# Patient Record
Sex: Male | Born: 1940 | Race: White | Hispanic: No | Marital: Married | Smoking: Never smoker
Health system: Southern US, Community
[De-identification: ages and names within clinical notes are randomized; demographics above are authoritative.]

## PROBLEM LIST (undated history)

## (undated) DIAGNOSIS — Z9889 Other specified postprocedural states: Secondary | ICD-10-CM

## (undated) DIAGNOSIS — N189 Chronic kidney disease, unspecified: Secondary | ICD-10-CM

## (undated) DIAGNOSIS — E785 Hyperlipidemia, unspecified: Secondary | ICD-10-CM

## (undated) DIAGNOSIS — I639 Cerebral infarction, unspecified: Secondary | ICD-10-CM

## (undated) DIAGNOSIS — I729 Aneurysm of unspecified site: Secondary | ICD-10-CM

## (undated) DIAGNOSIS — I2510 Atherosclerotic heart disease of native coronary artery without angina pectoris: Secondary | ICD-10-CM

## (undated) DIAGNOSIS — M2550 Pain in unspecified joint: Secondary | ICD-10-CM

## (undated) DIAGNOSIS — I82409 Acute embolism and thrombosis of unspecified deep veins of unspecified lower extremity: Secondary | ICD-10-CM

## (undated) DIAGNOSIS — R112 Nausea with vomiting, unspecified: Secondary | ICD-10-CM

## (undated) DIAGNOSIS — I10 Essential (primary) hypertension: Secondary | ICD-10-CM

## (undated) HISTORY — DX: Acute embolism and thrombosis of unspecified deep veins of unspecified lower extremity: I82.409

## (undated) HISTORY — PX: CHOLECYSTECTOMY: SHX55

## (undated) HISTORY — PX: CATARACT EXTRACTION: SUR2

## (undated) HISTORY — PX: TOTAL HIP ARTHROPLASTY: SHX124

## (undated) HISTORY — PX: COLONOSCOPY: SHX174

## (undated) HISTORY — PX: KNEE ARTHROSCOPY: SUR90

---

## 2004-07-22 ENCOUNTER — Ambulatory Visit: Payer: Self-pay | Admitting: Gastroenterology

## 2005-03-20 DIAGNOSIS — I729 Aneurysm of unspecified site: Secondary | ICD-10-CM

## 2005-03-20 HISTORY — DX: Aneurysm of unspecified site: I72.9

## 2006-01-18 HISTORY — PX: CARDIAC CATHETERIZATION: SHX172

## 2006-01-22 ENCOUNTER — Other Ambulatory Visit: Payer: Self-pay

## 2006-01-22 ENCOUNTER — Ambulatory Visit: Payer: Self-pay | Admitting: Internal Medicine

## 2006-01-30 ENCOUNTER — Ambulatory Visit: Payer: Self-pay | Admitting: *Deleted

## 2006-01-30 ENCOUNTER — Inpatient Hospital Stay: Payer: Self-pay | Admitting: *Deleted

## 2006-03-07 ENCOUNTER — Encounter: Payer: Self-pay | Admitting: Internal Medicine

## 2006-03-20 ENCOUNTER — Encounter: Payer: Self-pay | Admitting: Internal Medicine

## 2006-04-20 ENCOUNTER — Encounter: Payer: Self-pay | Admitting: Internal Medicine

## 2006-05-19 ENCOUNTER — Encounter: Payer: Self-pay | Admitting: Internal Medicine

## 2006-06-04 ENCOUNTER — Encounter: Payer: Self-pay | Admitting: Internal Medicine

## 2006-06-19 ENCOUNTER — Encounter: Payer: Self-pay | Admitting: Internal Medicine

## 2006-07-19 ENCOUNTER — Encounter: Payer: Self-pay | Admitting: Internal Medicine

## 2006-08-19 ENCOUNTER — Encounter: Payer: Self-pay | Admitting: Internal Medicine

## 2006-09-18 ENCOUNTER — Encounter: Payer: Self-pay | Admitting: Internal Medicine

## 2006-10-19 ENCOUNTER — Encounter: Payer: Self-pay | Admitting: Internal Medicine

## 2006-11-19 ENCOUNTER — Encounter: Payer: Self-pay | Admitting: Internal Medicine

## 2006-11-28 ENCOUNTER — Ambulatory Visit: Payer: Self-pay | Admitting: Nephrology

## 2007-05-19 ENCOUNTER — Emergency Department: Payer: Self-pay | Admitting: Emergency Medicine

## 2007-05-19 ENCOUNTER — Other Ambulatory Visit: Payer: Self-pay

## 2007-05-20 ENCOUNTER — Other Ambulatory Visit: Payer: Self-pay

## 2009-05-10 ENCOUNTER — Ambulatory Visit: Payer: Self-pay | Admitting: Internal Medicine

## 2009-08-20 ENCOUNTER — Ambulatory Visit: Payer: Self-pay | Admitting: Sports Medicine

## 2009-08-24 ENCOUNTER — Emergency Department (HOSPITAL_COMMUNITY)
Admission: EM | Admit: 2009-08-24 | Discharge: 2009-08-24 | Payer: Self-pay | Source: Home / Self Care | Admitting: Emergency Medicine

## 2010-03-20 HISTORY — PX: ELBOW SURGERY: SHX618

## 2010-05-23 ENCOUNTER — Encounter: Payer: Self-pay | Admitting: Sports Medicine

## 2010-06-06 LAB — BASIC METABOLIC PANEL
CO2: 22 mEq/L (ref 19–32)
Calcium: 9.2 mg/dL (ref 8.4–10.5)
Chloride: 108 mEq/L (ref 96–112)
GFR calc non Af Amer: 23 mL/min — ABNORMAL LOW (ref >60)
Glucose, Bld: 212 mg/dL — ABNORMAL HIGH (ref 70–99)
Potassium: 4.1 mEq/L (ref 3.5–5.1)
Sodium: 140 mEq/L (ref 135–145)

## 2010-06-06 LAB — POCT CARDIAC MARKERS
CKMB, poc: 4.5 ng/mL (ref 1.0–8.0)
CKMB, poc: 6.5 ng/mL (ref 1.0–8.0)
Troponin i, poc: <0.05 ng/mL (ref 0.00–0.09)

## 2010-06-06 LAB — DIFFERENTIAL
Eosinophils Absolute: 0 10*3/uL (ref 0.0–0.7)
Eosinophils Relative: 0 % (ref 0–5)
Lymphs Abs: 0.9 10*3/uL (ref 0.7–4.0)
Monocytes Absolute: 0.5 10*3/uL (ref 0.1–1.0)

## 2010-06-06 LAB — HEMOCCULT GUIAC POC 1CARD (OFFICE): Fecal Occult Bld: NEGATIVE

## 2010-06-06 LAB — CBC
HCT: 42 % (ref 39.0–52.0)
MCV: 96.3 fL (ref 78.0–100.0)
RBC: 4.36 MIL/uL (ref 4.22–5.81)

## 2010-06-06 LAB — GLUCOSE, CAPILLARY: Glucose-Capillary: 228 mg/dL — ABNORMAL HIGH (ref 70–99)

## 2010-06-15 ENCOUNTER — Ambulatory Visit: Payer: Self-pay | Admitting: Sports Medicine

## 2010-06-19 ENCOUNTER — Encounter: Payer: Self-pay | Admitting: Sports Medicine

## 2010-07-11 ENCOUNTER — Ambulatory Visit: Payer: Self-pay | Admitting: Sports Medicine

## 2010-07-20 ENCOUNTER — Other Ambulatory Visit: Payer: Self-pay

## 2010-07-20 ENCOUNTER — Inpatient Hospital Stay: Payer: Self-pay | Admitting: Internal Medicine

## 2010-09-08 ENCOUNTER — Encounter (HOSPITAL_COMMUNITY)
Admission: RE | Admit: 2010-09-08 | Discharge: 2010-09-08 | Disposition: A | Discharge status: Home / Self Care | Payer: Medicare Other | Source: Ambulatory Visit | Attending: Neurosurgery | Admitting: Neurosurgery

## 2010-09-08 ENCOUNTER — Ambulatory Visit (HOSPITAL_COMMUNITY)
Admission: RE | Admit: 2010-09-08 | Discharge: 2010-09-08 | Disposition: A | Discharge status: Home / Self Care | Payer: Medicare Other | Source: Ambulatory Visit | Attending: Neurosurgery | Admitting: Neurosurgery

## 2010-09-08 ENCOUNTER — Other Ambulatory Visit (HOSPITAL_COMMUNITY): Payer: Self-pay | Admitting: Neurosurgery

## 2010-09-08 DIAGNOSIS — M48061 Spinal stenosis, lumbar region without neurogenic claudication: Secondary | ICD-10-CM | POA: Insufficient documentation

## 2010-09-08 DIAGNOSIS — Z01818 Encounter for other preprocedural examination: Secondary | ICD-10-CM | POA: Insufficient documentation

## 2010-09-08 DIAGNOSIS — Z01812 Encounter for preprocedural laboratory examination: Secondary | ICD-10-CM | POA: Insufficient documentation

## 2010-09-08 LAB — COMPREHENSIVE METABOLIC PANEL
AST: 17 U/L (ref 0–37)
Alkaline Phosphatase: 62 U/L (ref 39–117)
Chloride: 109 mEq/L (ref 96–112)
Creatinine, Ser: 1.41 mg/dL — ABNORMAL HIGH (ref 0.50–1.35)
GFR calc Af Amer: >60 mL/min (ref >60)
Glucose, Bld: 101 mg/dL — ABNORMAL HIGH (ref 70–99)
Sodium: 146 mEq/L — ABNORMAL HIGH (ref 135–145)
Total Protein: 6.1 g/dL (ref 6.0–8.3)

## 2010-09-08 LAB — CBC
Hemoglobin: 11.9 g/dL — ABNORMAL LOW (ref 13.0–17.0)
MCHC: 33.9 g/dL (ref 30.0–36.0)
MCV: 93.4 fL (ref 78.0–100.0)
RBC: 3.76 MIL/uL — ABNORMAL LOW (ref 4.22–5.81)

## 2010-09-08 LAB — SURGICAL PCR SCREEN
MRSA, PCR: NEGATIVE
Staphylococcus aureus: NEGATIVE

## 2010-09-13 ENCOUNTER — Ambulatory Visit (HOSPITAL_COMMUNITY)
Admission: RE | Admit: 2010-09-13 | Discharge: 2010-09-14 | Disposition: A | Discharge status: Home / Self Care | Payer: Medicare Other | Source: Ambulatory Visit | Attending: Neurosurgery | Admitting: Neurosurgery

## 2010-09-13 ENCOUNTER — Inpatient Hospital Stay (HOSPITAL_COMMUNITY): Payer: Medicare Other

## 2010-09-13 DIAGNOSIS — M47817 Spondylosis without myelopathy or radiculopathy, lumbosacral region: Principal | ICD-10-CM | POA: Insufficient documentation

## 2010-09-13 DIAGNOSIS — M4310 Spondylolisthesis, site unspecified: Secondary | ICD-10-CM | POA: Insufficient documentation

## 2010-09-13 DIAGNOSIS — I2510 Atherosclerotic heart disease of native coronary artery without angina pectoris: Secondary | ICD-10-CM | POA: Insufficient documentation

## 2010-09-13 DIAGNOSIS — Z9861 Coronary angioplasty status: Secondary | ICD-10-CM | POA: Insufficient documentation

## 2010-09-13 DIAGNOSIS — E119 Type 2 diabetes mellitus without complications: Secondary | ICD-10-CM | POA: Insufficient documentation

## 2010-09-13 DIAGNOSIS — Z01818 Encounter for other preprocedural examination: Secondary | ICD-10-CM | POA: Insufficient documentation

## 2010-09-13 DIAGNOSIS — Z01812 Encounter for preprocedural laboratory examination: Secondary | ICD-10-CM | POA: Insufficient documentation

## 2010-09-13 DIAGNOSIS — I10 Essential (primary) hypertension: Secondary | ICD-10-CM | POA: Insufficient documentation

## 2010-09-13 LAB — GLUCOSE, CAPILLARY
Glucose-Capillary: 107 mg/dL — ABNORMAL HIGH (ref 70–99)
Glucose-Capillary: 161 mg/dL — ABNORMAL HIGH (ref 70–99)
Glucose-Capillary: 88 mg/dL (ref 70–99)
Glucose-Capillary: 81 mg/dL (ref 70–99)

## 2010-09-15 HISTORY — PX: BACK SURGERY: SHX140

## 2010-09-20 NOTE — Op Note (Signed)
  NAMEMarland Kitchen  DOVID, BARTKO NO.:  [id number]  MEDICAL RECORD NO.:  [id number]  LOCATION:  3536                         FACILITY:  [facility]  PHYSICIAN:  Danae Orleans. Venetia Maxon, M.D.  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  PROCEDURES:  L4 and L5 decompressive lumbar laminectomy for spinal stenosis with decompression of the L4, L5, and S1 nerve roots.  SURGEON:  Danae Orleans. Venetia Maxon, MD  ASSISTANT:  Georgiann Cocker, RN and Cristi Loron, MD  ANESTHESIA:  General endotracheal anesthesia.  ESTIMATED BLOOD LOSS:  Minimal.  COMPLICATIONS:  None.  DISPOSITION:  To recovery.  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  He has mild minimal spondylolisthesis at L4 and L5, which does not increase with flexion extension, and it was elected to take him to surgery for decompressive lumbar laminectomy for spinal stenosis.  PROCEDURE IN DETAIL:  Todd [name] was brought to the operating room. Following satisfactory and uncomplicated induction of general endotracheal anesthesia, the patient was placed in prone position on Wilson frame.  His low back was prepped and draped in usual sterile fashion.  The area of planned incision was infiltrated with local lidocaine.  Incision was made in the midline, carried to the lumbodorsal fascia was incised bilaterally exposing the L4 and L5 spinous processes. Subperiosteal dissection was performed exposing the L4-5 and L5-S1 interlaminar spaces.  Intraoperative x-ray confirmed correct orientation with marker probes at these  levels.  A total laminectomy of L5 was performed.  The inferior two-thirds of L4 was removed.  The high-speed drill was used to thin the laminae at L4 and L5 and the spinal canal was decompressed under loupe magnification with a variety of Kerrison rongeurs and angled curettes to decompress the L4, L5, and S1 nerve roots bilaterally.  The neural elements were felt to be well decompressed bilaterally.  Hemostasis assured with Gelfoam soaked in thrombin.  The wound was irrigated with copious bacitracin and saline. The self-retaining retractor was removed.  The lumbodorsal fascia was closed with 0 Vicryl sutures, subcutaneous tissues were approximated with 2-0 Vicryl interrupted inverted sutures, and skin edges were approximated with 3-0 Vicryl subcuticular stitch.  Wound was dressed with Dermabond.  The patient was x-rayed in the operating room, taken to recovery in stable and satisfactory condition having tolerated the operation well.  Counts were correct at the end of the case.     Danae Orleans. Venetia Maxon, M.D.     JDS/MEDQ  D:  09/13/2010  T:  09/13/2010  Job:  [id number]  Electronically Signed by Maeola Harman M.D. on 09/20/2010 08:49:55 AM

## 2010-12-21 ENCOUNTER — Ambulatory Visit: Payer: Self-pay | Admitting: Ophthalmology

## 2010-12-30 ENCOUNTER — Encounter (HOSPITAL_COMMUNITY)
Admission: RE | Admit: 2010-12-30 | Discharge: 2010-12-30 | Disposition: A | Discharge status: Home / Self Care | Payer: Medicare Other | Source: Ambulatory Visit | Attending: Neurosurgery | Admitting: Neurosurgery

## 2010-12-30 LAB — SURGICAL PCR SCREEN
MRSA, PCR: NEGATIVE
Staphylococcus aureus: NEGATIVE

## 2010-12-30 LAB — CBC
MCH: 31.2 pg (ref 26.0–34.0)
MCHC: 35.3 g/dL (ref 30.0–36.0)

## 2010-12-30 LAB — BASIC METABOLIC PANEL
BUN: 32 mg/dL — ABNORMAL HIGH (ref 6–23)
CO2: 23 mEq/L (ref 19–32)
Calcium: 9 mg/dL (ref 8.4–10.5)

## 2011-01-10 ENCOUNTER — Observation Stay (HOSPITAL_COMMUNITY)
Admission: RE | Admit: 2011-01-10 | Discharge: 2011-01-10 | Disposition: A | Discharge status: Home / Self Care | Payer: Medicare Other | Source: Ambulatory Visit | Attending: Neurosurgery | Admitting: Neurosurgery

## 2011-01-10 DIAGNOSIS — E669 Obesity, unspecified: Secondary | ICD-10-CM | POA: Insufficient documentation

## 2011-01-10 DIAGNOSIS — Z01812 Encounter for preprocedural laboratory examination: Secondary | ICD-10-CM | POA: Insufficient documentation

## 2011-01-10 DIAGNOSIS — I10 Essential (primary) hypertension: Secondary | ICD-10-CM | POA: Insufficient documentation

## 2011-01-10 DIAGNOSIS — G5620 Lesion of ulnar nerve, unspecified upper limb: Principal | ICD-10-CM | POA: Insufficient documentation

## 2011-01-10 DIAGNOSIS — Z794 Long term (current) use of insulin: Secondary | ICD-10-CM | POA: Insufficient documentation

## 2011-01-10 DIAGNOSIS — E119 Type 2 diabetes mellitus without complications: Secondary | ICD-10-CM | POA: Insufficient documentation

## 2011-01-10 LAB — GLUCOSE, CAPILLARY
Glucose-Capillary: 132 mg/dL — ABNORMAL HIGH (ref 70–99)
Glucose-Capillary: 137 mg/dL — ABNORMAL HIGH (ref 70–99)
Glucose-Capillary: 163 mg/dL — ABNORMAL HIGH (ref 70–99)

## 2011-01-17 NOTE — Op Note (Signed)
  NAMEMarland Kitchen  KIMBLE, [name] NO.:  [id number]  MEDICAL RECORD NO.:  [id number]  LOCATION:  3528                         FACILITY:  [facility]  PHYSICIAN:  Danae Orleans. Venetia Maxon, M.D.  DATE OF BIRTH:  1940/12/01  DATE OF PROCEDURE:  01/10/2011 DATE OF DISCHARGE:                              OPERATIVE REPORT   PREOPERATIVE DIAGNOSIS:  Right ulnar neuropathy.  POSTOPERATIVE DIAGNOSIS:  Right ulnar neuropathy.  PROCEDURE:  Right ulnar nerve decompression.  SURGEONS:  Danae Orleans. Venetia Maxon, MD  ANESTHESIA:  General LMA with local anesthetic.  ESTIMATED BLOOD LOSS:  Minimal.  COMPLICATIONS:  None.  DISPOSITION:  Recovery.  INDICATIONS:  Todd [name] is a 70 year old man with a profound right ulnar neuropathy with atrophy in his hands and block.  EMG and nerve conduction velocity established block at the elbow.  It was elected to perform a right ulnar nerve decompression.  PROCEDURE:  [name] was brought to the operating room.  Following satisfactory and uncomplicated induction of general anesthesia with LMA, he was placed in a supine position.  His right arm and medial elbow were then shaved, then his arm was prepped and draped in usual sterile fashion using Betadine scrub and paint and sterile stockinette.  Area of planned incision was infiltrated with local lidocaine.  A small curvilinear incision approximately 5 cm in length was made just above the medial epicondyle carried sharply through the fascia layers to expose the ulnar nerve.  This was then very carefully decompressed under loupe magnification.  There were some areas of constriction particularly just below the elbow joint and adjust at the nerve entered into the flexor carpi ulnaris where there was pain and band of constrictive fascia which was compressing the nerve.  This space was opened, additionally at the level of the elbow there appeared to be some constrictive tissue and also more proximal to the elbow.   There was a fascial area of constriction by fascia and this was opened. Subsequently, it was felt that the nerve was well decompressed both proximally and distally without any evidence of continued compression. Additionally the elbow was flexed and there did not appear to be any subluxation of the nerve.  The wound was then irrigated.  Hemostasis assured and the subcutaneous tissues were reapproximated with 2-0 Vicryl sutures.  Skin edges were approximated with 3-0 Vicryl subcuticular stitch.  Wound was dressed with benzoin, Steri-Strips, Telfa gauze, fluffs gauze, fluffs Kerlix, Kerlix and Kling wrap.  The patient was then extubated in the operating room, taken to recovery in stable satisfactory condition, having tolerated the operation well.  Counts were correct at the end of the case.     Danae Orleans. Venetia Maxon, M.D.     JDS/MEDQ  D:  01/10/2011  T:  01/10/2011  Job:  [id number]  Electronically Signed by Maeola Harman M.D. on 01/17/2011 07:49:24 AM

## 2011-01-30 ENCOUNTER — Other Ambulatory Visit: Payer: Self-pay | Admitting: Neurosurgery

## 2011-02-01 ENCOUNTER — Ambulatory Visit: Payer: Self-pay | Admitting: Ophthalmology

## 2011-02-21 ENCOUNTER — Encounter (HOSPITAL_COMMUNITY): Payer: Self-pay | Admitting: Pharmacy Technician

## 2011-02-24 ENCOUNTER — Encounter (HOSPITAL_COMMUNITY): Payer: Self-pay

## 2011-02-24 ENCOUNTER — Encounter (HOSPITAL_COMMUNITY)
Admission: RE | Admit: 2011-02-24 | Discharge: 2011-02-24 | Disposition: A | Discharge status: Home / Self Care | Payer: Medicare Other | Source: Ambulatory Visit | Attending: Neurosurgery | Admitting: Neurosurgery

## 2011-02-24 HISTORY — DX: Pain in unspecified joint: M25.50

## 2011-02-24 HISTORY — DX: Essential (primary) hypertension: I10

## 2011-02-24 HISTORY — DX: Nausea with vomiting, unspecified: R11.2

## 2011-02-24 HISTORY — DX: Aneurysm of unspecified site: I72.9

## 2011-02-24 HISTORY — DX: Other specified postprocedural states: Z98.890

## 2011-02-24 HISTORY — DX: Hyperlipidemia, unspecified: E78.5

## 2011-02-24 HISTORY — DX: Atherosclerotic heart disease of native coronary artery without angina pectoris: I25.10

## 2011-02-24 LAB — BASIC METABOLIC PANEL
BUN: 35 mg/dL — ABNORMAL HIGH (ref 6–23)
Creatinine, Ser: 1.66 mg/dL — ABNORMAL HIGH (ref 0.50–1.35)
GFR calc Af Amer: 47 mL/min — ABNORMAL LOW (ref >90)
GFR calc non Af Amer: 40 mL/min — ABNORMAL LOW (ref >90)
Glucose, Bld: 119 mg/dL — ABNORMAL HIGH (ref 70–99)
Potassium: 4.2 mEq/L (ref 3.5–5.1)

## 2011-02-24 LAB — CBC
Hemoglobin: 13.3 g/dL (ref 13.0–17.0)
MCH: 31 pg (ref 26.0–34.0)
MCHC: 34.6 g/dL (ref 30.0–36.0)
RDW: 13.1 % (ref 11.5–15.5)

## 2011-02-24 NOTE — Pre-Procedure Instructions (Signed)
20 Todd [name]  02/24/2011   Your procedure is scheduled on:  Thurs,Dec 13 @ 0745  Report to [facility] at 0530 AM.  Call this number if you have problems the morning of surgery: [phone]   Remember:   Do not eat food:After Midnight.  May have clear liquids: up to 4 Hours before arrival.(until 1:30am)  Clear liquids include soda, tea, black coffee, apple or grape juice, broth.  Take these medicines the morning of surgery with A SIP OF WATER: Amlodipine,Pain Pill(if needed),Metoprolol   Do not wear jewelry, make-up or nail polish.  Do not wear lotions, powders, or perfumes. You may wear deodorant.  Do not shave 48 hours prior to surgery.  Do not bring valuables to the hospital.  Contacts, dentures or bridgework may not be worn into surgery.  Leave suitcase in the car. After surgery it may be brought to your room.  For patients admitted to the hospital, checkout time is 11:00 AM the day of discharge.   Patients discharged the day of surgery will not be allowed to drive home.  Name and phone number of your driver:   Special Instructions: CHG Shower Use Special Wash: 1/2 bottle night before surgery and 1/2 bottle morning of surgery.   Please read over the following fact sheets that you were given: Pain Booklet, Coughing and Deep Breathing, MRSA Information and Surgical Site Infection Prevention

## 2011-02-24 NOTE — Progress Notes (Signed)
[facility]-is cardiologist;[place]--has an appointment 03/01/11  Stress test /echo/ekg in May 2012 requested from [facility]

## 2011-03-01 MED ORDER — CEFAZOLIN SODIUM-DEXTROSE 2-3 GM-% IV SOLR
2.0000 g | INTRAVENOUS | Status: AC
  Administered 2011-03-02: 2 g via INTRAVENOUS
  Filled 2011-03-01: qty 50

## 2011-03-02 ENCOUNTER — Other Ambulatory Visit: Payer: Self-pay

## 2011-03-02 ENCOUNTER — Encounter (HOSPITAL_COMMUNITY): Payer: Self-pay | Admitting: *Deleted

## 2011-03-02 ENCOUNTER — Encounter (HOSPITAL_COMMUNITY): Payer: Self-pay | Admitting: Anesthesiology

## 2011-03-02 ENCOUNTER — Encounter (HOSPITAL_COMMUNITY)
Admission: RE | Disposition: A | Discharge status: Home / Self Care | Payer: Self-pay | Source: Ambulatory Visit | Attending: Neurosurgery

## 2011-03-02 ENCOUNTER — Ambulatory Visit (HOSPITAL_COMMUNITY): Payer: Medicare Other | Admitting: Anesthesiology

## 2011-03-02 ENCOUNTER — Ambulatory Visit (HOSPITAL_COMMUNITY)
Admission: RE | Admit: 2011-03-02 | Discharge: 2011-03-02 | Disposition: A | Discharge status: Home / Self Care | Payer: Medicare Other | Source: Ambulatory Visit | Attending: Neurosurgery | Admitting: Neurosurgery

## 2011-03-02 DIAGNOSIS — G5620 Lesion of ulnar nerve, unspecified upper limb: Secondary | ICD-10-CM | POA: Insufficient documentation

## 2011-03-02 DIAGNOSIS — Z794 Long term (current) use of insulin: Secondary | ICD-10-CM | POA: Insufficient documentation

## 2011-03-02 DIAGNOSIS — I10 Essential (primary) hypertension: Secondary | ICD-10-CM | POA: Insufficient documentation

## 2011-03-02 DIAGNOSIS — E109 Type 1 diabetes mellitus without complications: Secondary | ICD-10-CM | POA: Insufficient documentation

## 2011-03-02 DIAGNOSIS — C001 Malignant neoplasm of external lower lip: Secondary | ICD-10-CM | POA: Insufficient documentation

## 2011-03-02 HISTORY — PX: ULNAR NERVE TRANSPOSITION: SHX2595

## 2011-03-02 LAB — GLUCOSE, CAPILLARY: Glucose-Capillary: 155 mg/dL — ABNORMAL HIGH (ref 70–99)

## 2011-03-02 SURGERY — ULNAR NERVE DECOMPRESSION/TRANSPOSITION
Anesthesia: General | Laterality: Left | Wound class: Clean

## 2011-03-02 MED ORDER — ROSUVASTATIN CALCIUM 10 MG PO TABS
10.0000 mg | ORAL_TABLET | Freq: Every day | ORAL | Status: DC
  Filled 2011-03-02: qty 1

## 2011-03-02 MED ORDER — INSULIN ASPART 100 UNIT/ML ~~LOC~~ SOLN: 9.0000 [IU] | Freq: Every day | SUBCUTANEOUS | Status: DC

## 2011-03-02 MED ORDER — AMLODIPINE BESYLATE 5 MG PO TABS
5.0000 mg | ORAL_TABLET | Freq: Every day | ORAL | Status: DC
  Filled 2011-03-02 (×2): qty 1

## 2011-03-02 MED ORDER — ONDANSETRON HCL 4 MG/2ML IJ SOLN: 4.0000 mg | INTRAMUSCULAR | Status: DC | PRN

## 2011-03-02 MED ORDER — LACTATED RINGERS IV SOLN
INTRAVENOUS | Status: DC | PRN
  Administered 2011-03-02 (×2): via INTRAVENOUS

## 2011-03-02 MED ORDER — HYDROCODONE-ACETAMINOPHEN 5-325 MG PO TABS: 1.0000 | ORAL_TABLET | Freq: Every day | ORAL | Status: DC

## 2011-03-02 MED ORDER — ONDANSETRON HCL 4 MG/2ML IJ SOLN
INTRAMUSCULAR | Status: DC | PRN
  Administered 2011-03-02: 4 mg via INTRAVENOUS

## 2011-03-02 MED ORDER — INSULIN GLARGINE 100 UNIT/ML ~~LOC~~ SOLN
32.0000 [IU] | Freq: Every day | SUBCUTANEOUS | Status: DC
  Filled 2011-03-02: qty 3

## 2011-03-02 MED ORDER — SODIUM CHLORIDE 0.9 % IJ SOLN: 3.0000 mL | INTRAMUSCULAR | Status: DC | PRN

## 2011-03-02 MED ORDER — KETOROLAC TROMETHAMINE 30 MG/ML IJ SOLN
30.0000 mg | Freq: Four times a day (QID) | INTRAMUSCULAR | Status: DC
  Administered 2011-03-02: 30 mg via INTRAVENOUS
  Filled 2011-03-02: qty 1

## 2011-03-02 MED ORDER — PROPOFOL 10 MG/ML IV BOLUS
INTRAVENOUS | Status: DC | PRN
  Administered 2011-03-02: 130 mg via INTRAVENOUS

## 2011-03-02 MED ORDER — INSULIN ASPART 100 UNIT/ML ~~LOC~~ SOLN
8.0000 [IU] | SUBCUTANEOUS | Status: DC
  Administered 2011-03-02: 8 [IU] via SUBCUTANEOUS

## 2011-03-02 MED ORDER — PHENOL 1.4 % MT LIQD: 1.0000 | OROMUCOSAL | Status: DC | PRN

## 2011-03-02 MED ORDER — MORPHINE SULFATE 4 MG/ML IJ SOLN: 1.0000 mg | INTRAMUSCULAR | Status: DC | PRN

## 2011-03-02 MED ORDER — BACITRACIN 50000 UNITS IM SOLR
INTRAMUSCULAR | Status: AC
  Filled 2011-03-02: qty 50000

## 2011-03-02 MED ORDER — FENTANYL CITRATE 0.05 MG/ML IJ SOLN
INTRAMUSCULAR | Status: DC | PRN
  Administered 2011-03-02: 100 ug via INTRAVENOUS

## 2011-03-02 MED ORDER — PHENYLEPHRINE HCL 10 MG/ML IJ SOLN
INTRAMUSCULAR | Status: DC | PRN
  Administered 2011-03-02 (×3): 80 ug via INTRAVENOUS

## 2011-03-02 MED ORDER — HYDROMORPHONE HCL PF 1 MG/ML IJ SOLN: 0.2500 mg | INTRAMUSCULAR | Status: DC | PRN

## 2011-03-02 MED ORDER — ACETAMINOPHEN 650 MG RE SUPP: 650.0000 mg | RECTAL | Status: DC | PRN

## 2011-03-02 MED ORDER — INSULIN ASPART 100 UNIT/ML ~~LOC~~ SOLN: 0.0000 [IU] | Freq: Every day | SUBCUTANEOUS | Status: DC

## 2011-03-02 MED ORDER — ASPIRIN EC 81 MG PO TBEC
81.0000 mg | DELAYED_RELEASE_TABLET | Freq: Every day | ORAL | Status: DC
  Administered 2011-03-02: 81 mg via ORAL
  Filled 2011-03-02: qty 1

## 2011-03-02 MED ORDER — GLYCOPYRROLATE 0.2 MG/ML IJ SOLN
INTRAMUSCULAR | Status: DC | PRN
  Administered 2011-03-02 (×2): .2 mg via INTRAVENOUS

## 2011-03-02 MED ORDER — INSULIN ASPART 100 UNIT/ML ~~LOC~~ SOLN: 7.0000 [IU] | Freq: Three times a day (TID) | SUBCUTANEOUS | Status: DC

## 2011-03-02 MED ORDER — MEPERIDINE HCL 25 MG/ML IJ SOLN: 6.2500 mg | INTRAMUSCULAR | Status: DC | PRN

## 2011-03-02 MED ORDER — CEFAZOLIN SODIUM 1-5 GM-% IV SOLN
1.0000 g | Freq: Three times a day (TID) | INTRAVENOUS | Status: DC
  Administered 2011-03-02: 1 g via INTRAVENOUS
  Filled 2011-03-02 (×2): qty 50

## 2011-03-02 MED ORDER — LIDOCAINE HCL (CARDIAC) 20 MG/ML IV SOLN
INTRAVENOUS | Status: DC | PRN
  Administered 2011-03-02: 40 mg via INTRAVENOUS

## 2011-03-02 MED ORDER — INSULIN ASPART 100 UNIT/ML ~~LOC~~ SOLN
0.0000 [IU] | Freq: Three times a day (TID) | SUBCUTANEOUS | Status: DC
  Administered 2011-03-02: 3 [IU] via SUBCUTANEOUS
  Filled 2011-03-02: qty 3

## 2011-03-02 MED ORDER — HYDROCODONE-ACETAMINOPHEN 5-325 MG PO TABS
1.0000 | ORAL_TABLET | ORAL | Status: DC | PRN
  Administered 2011-03-02 (×2): 1 via ORAL
  Filled 2011-03-02 (×2): qty 1

## 2011-03-02 MED ORDER — DOCUSATE SODIUM 100 MG PO CAPS: 100.0000 mg | ORAL_CAPSULE | Freq: Two times a day (BID) | ORAL | Status: DC

## 2011-03-02 MED ORDER — ONDANSETRON HCL 4 MG/2ML IJ SOLN: 4.0000 mg | Freq: Once | INTRAMUSCULAR | Status: DC | PRN

## 2011-03-02 MED ORDER — MENTHOL 3 MG MT LOZG: 1.0000 | LOZENGE | OROMUCOSAL | Status: DC | PRN

## 2011-03-02 MED ORDER — ZOLPIDEM TARTRATE 5 MG PO TABS: 5.0000 mg | ORAL_TABLET | Freq: Every evening | ORAL | Status: DC | PRN

## 2011-03-02 MED ORDER — SODIUM CHLORIDE 0.9 % IV SOLN
INTRAVENOUS | Status: AC
  Filled 2011-03-02: qty 500

## 2011-03-02 MED ORDER — SODIUM CHLORIDE 0.9 % IJ SOLN
3.0000 mL | Freq: Two times a day (BID) | INTRAMUSCULAR | Status: DC
  Administered 2011-03-02: 3 mL via INTRAVENOUS

## 2011-03-02 MED ORDER — INSULIN ASPART 100 UNIT/ML ~~LOC~~ SOLN: 7.0000 [IU] | Freq: Every day | SUBCUTANEOUS | Status: DC

## 2011-03-02 MED ORDER — OXYCODONE-ACETAMINOPHEN 5-325 MG PO TABS: 1.0000 | ORAL_TABLET | ORAL | Status: DC | PRN

## 2011-03-02 MED ORDER — LIDOCAINE HCL (PF) 1 % IJ SOLN
INTRAMUSCULAR | Status: DC | PRN
  Administered 2011-03-02: 10 mL

## 2011-03-02 MED ORDER — SODIUM BICARBONATE 650 MG PO TABS
1300.0000 mg | ORAL_TABLET | Freq: Three times a day (TID) | ORAL | Status: DC
  Administered 2011-03-02: 1300 mg via ORAL
  Filled 2011-03-02 (×3): qty 2

## 2011-03-02 MED ORDER — BACITRACIN ZINC 500 UNIT/GM EX OINT
TOPICAL_OINTMENT | CUTANEOUS | Status: DC | PRN
  Administered 2011-03-02: 1 via TOPICAL

## 2011-03-02 MED ORDER — METOPROLOL TARTRATE 25 MG PO TABS
25.0000 mg | ORAL_TABLET | Freq: Every day | ORAL | Status: DC
  Filled 2011-03-02: qty 1

## 2011-03-02 MED ORDER — KCL IN DEXTROSE-NACL 20-5-0.45 MEQ/L-%-% IV SOLN
INTRAVENOUS | Status: DC
  Filled 2011-03-02 (×2): qty 1000

## 2011-03-02 MED ORDER — VITAMIN D3 25 MCG (1000 UNIT) PO TABS
1000.0000 [IU] | ORAL_TABLET | Freq: Every day | ORAL | Status: DC
  Filled 2011-03-02: qty 1

## 2011-03-02 MED ORDER — ACETAMINOPHEN 325 MG PO TABS: 650.0000 mg | ORAL_TABLET | ORAL | Status: DC | PRN

## 2011-03-02 MED ORDER — INSULIN ASPART 100 UNIT/ML ~~LOC~~ SOLN: 4.0000 [IU] | Freq: Three times a day (TID) | SUBCUTANEOUS | Status: DC

## 2011-03-02 SURGICAL SUPPLY — 52 items
BAG DECANTER FOR FLEXI CONT (MISCELLANEOUS) ×2 IMPLANT
BANDAGE ELASTIC 4 VELCRO ST LF (GAUZE/BANDAGES/DRESSINGS) IMPLANT
BANDAGE GAUZE 4  KLING STR (GAUZE/BANDAGES/DRESSINGS) ×2 IMPLANT
BANDAGE GAUZE ELAST BULKY 4 IN (GAUZE/BANDAGES/DRESSINGS) ×4 IMPLANT
BENZOIN TINCTURE PRP APPL 2/3 (GAUZE/BANDAGES/DRESSINGS) ×2 IMPLANT
BLADE SURG 10 STRL SS (BLADE) ×2 IMPLANT
BLADE SURG 15 STRL LF DISP TIS (BLADE) ×1 IMPLANT
BLADE SURG 15 STRL SS (BLADE) ×1
BRUSH SCRUB EZ PLAIN DRY (MISCELLANEOUS) ×2 IMPLANT
CANISTER SUCTION 2500CC (MISCELLANEOUS) ×2 IMPLANT
CLOTH BEACON ORANGE TIMEOUT ST (SAFETY) ×2 IMPLANT
CORDS BIPOLAR (ELECTRODE) ×2 IMPLANT
DRAPE EXTREMITY T 121X128X90 (DRAPE) ×2 IMPLANT
DRAPE PROXIMA HALF (DRAPES) ×4 IMPLANT
DRESSING TELFA 8X3 (GAUZE/BANDAGES/DRESSINGS) ×2 IMPLANT
ELECT REM PT RETURN 9FT ADLT (ELECTROSURGICAL) ×2
ELECTRODE REM PT RTRN 9FT ADLT (ELECTROSURGICAL) ×1 IMPLANT
GAUZE SPONGE 4X4 16PLY XRAY LF (GAUZE/BANDAGES/DRESSINGS) ×2 IMPLANT
GLOVE BIO SURGEON STRL SZ8 (GLOVE) ×2 IMPLANT
GLOVE BIOGEL PI IND STRL 8.5 (GLOVE) ×2 IMPLANT
GLOVE BIOGEL PI INDICATOR 8.5 (GLOVE) ×2
GLOVE EXAM NITRILE LRG STRL (GLOVE) IMPLANT
GLOVE EXAM NITRILE MD LF STRL (GLOVE) ×2 IMPLANT
GLOVE EXAM NITRILE XL STR (GLOVE) IMPLANT
GLOVE EXAM NITRILE XS STR PU (GLOVE) IMPLANT
GLOVE SURG SS PI 8.0 STRL IVOR (GLOVE) ×4 IMPLANT
GOWN BRE IMP SLV AUR LG STRL (GOWN DISPOSABLE) IMPLANT
GOWN BRE IMP SLV AUR XL STRL (GOWN DISPOSABLE) ×2 IMPLANT
GOWN STRL REIN 2XL LVL4 (GOWN DISPOSABLE) ×2 IMPLANT
KIT BASIN OR (CUSTOM PROCEDURE TRAY) ×2 IMPLANT
KIT ROOM TURNOVER OR (KITS) ×2 IMPLANT
LOCATOR NERVE 3 VOLT (DISPOSABLE) ×2 IMPLANT
LOOP VESSEL MAXI BLUE (MISCELLANEOUS) ×2 IMPLANT
NEEDLE HYPO 25X1 1.5 SAFETY (NEEDLE) ×2 IMPLANT
NS IRRIG 1000ML POUR BTL (IV SOLUTION) ×2 IMPLANT
PACK SURGICAL SETUP 50X90 (CUSTOM PROCEDURE TRAY) ×2 IMPLANT
PAD ARMBOARD 7.5X6 YLW CONV (MISCELLANEOUS) ×6 IMPLANT
PENCIL BUTTON HOLSTER BLD 10FT (ELECTRODE) ×2 IMPLANT
SPONGE GAUZE 4X4 12PLY (GAUZE/BANDAGES/DRESSINGS) ×2 IMPLANT
STOCKINETTE 4X48 STRL (DRAPES) ×2 IMPLANT
STRIP CLOSURE SKIN 1/2X4 (GAUZE/BANDAGES/DRESSINGS) ×2 IMPLANT
SUT ETHILON 3 0 PS 1 (SUTURE) IMPLANT
SUT SILK 2 0 FS (SUTURE) IMPLANT
SUT VIC AB 2-0 CP2 18 (SUTURE) ×2 IMPLANT
SUT VIC AB 3-0 SH 8-18 (SUTURE) ×2 IMPLANT
SYR BULB 3OZ (MISCELLANEOUS) ×2 IMPLANT
SYR CONTROL 10ML LL (SYRINGE) ×2 IMPLANT
TOWEL OR 17X24 6PK STRL BLUE (TOWEL DISPOSABLE) ×2 IMPLANT
TOWEL OR 17X26 10 PK STRL BLUE (TOWEL DISPOSABLE) ×2 IMPLANT
TUBE CONNECTING 12X1/4 (SUCTIONS) ×2 IMPLANT
UNDERPAD 30X30 INCONTINENT (UNDERPADS AND DIAPERS) ×2 IMPLANT
WATER STERILE IRR 1000ML POUR (IV SOLUTION) ×2 IMPLANT

## 2011-03-02 NOTE — Op Note (Signed)
03/02/2011  8:49 AM  PATIENT:  Todd [name]  70 y.o. male  PRE-OPERATIVE DIAGNOSIS:  ulnar neruropathy  POST-OPERATIVE DIAGNOSIS:  ulnar neuropathy  PROCEDURE:  Procedure(s): ULNAR NERVE DECOMPRESSION  SURGEON:  Surgeon(s): Dorian Heckle, MD  PHYSICIAN ASSISTANT:   ASSISTANTS: none   ANESTHESIA:   general  EBL:  Total I/O In: 1100 [I.V.:1100] Out: -   BLOOD ADMINISTERED:none  DRAINS: none   LOCAL MEDICATIONS USED:  LIDOCAINE 10CC  SPECIMEN:  No Specimen  DISPOSITION OF SPECIMEN:  N/A  COUNTS:  YES  TOURNIQUET:  * No tourniquets in log *  DICTATION: Patient previously underwent right ulnar nerve decompression and did well with this. He has left ulnar neuropathy and it was elected to perform a left ulnar nerve decompression.  Procedure: Patient was brought to the operating room. Following the smooth and uncomplicated induction of general anesthesia with LMA, patient's left arm was prepped and draped in the usual sterile fashion with Betadine scrub and paint and sterile stockinette. Left medial epicondyle was identified and a small curvilinear incision was made based on this, after infiltrating the skin and subcutaneous tissues with local lidocaine. Under high-power loupe magnification the ulnar nerve was identified and carefully freed from a ring of encircling scar tissue proximal to the cubital tunnel. The nerve was freed more proximally in the arm and also more distally into the forearm between the 2 heads of the flexor carpi ulnaris. Range of motion testing did not reveal a tendency for the nerve to sublux. Consequently no transposition was performed. The wound was irrigated and closed with 20 and 3-0 Vicryl stitches a sterile occlusive dressing was placed along with a bulky wrap. Patient was taken to recovery in stable and satisfactory condition having tolerated the operation well after correct in the case.   PLAN OF CARE: Admit for overnight observation  PATIENT  DISPOSITION:  PACU - hemodynamically stable.   Delay start of Pharmacological VTE agent (>24hrs) due to surgical blood loss or risk of bleeding:  YES

## 2011-03-02 NOTE — H&P (Signed)
  Date of Initial H&P: 01/31/2011  History reviewed, patient examined, no change in status, stable for surgery.

## 2011-03-02 NOTE — H&P (Signed)
Todd [name]  #[id number] DOB:  July 05, 1940 01/31/2011:  Todd [name] returns today. Todd [name] is doing very well following right ulnar nerve decompression. Todd [name] does have intermittent pain in his right elbow and says the numbness persists in his right fifth digit. Todd [name] is taking Hydrocodone 5/325 nightly.    His left arm continues to be symptomatic. I told him that given the muscular atrophy I did not think that Todd [name] would have a speedy recovery of his right-sided weakness.    We are going to go ahead with left ulnar nerve decompression on 03/02/2011.  Risks and benefits were discussed and Todd [name] wishes to proceed.           Danae Orleans. Venetia Maxon, M.D./aft  cc: Dr. Merri Ray  Dr. Arnoldo Hooker  Dr. Conchita Paris   Todd [name]  #[id number] DOB:  [birth year] 11/07/2010:     Todd [name] comes back today doing well from the standpoint of his low back lumbar decompression surgery.  Todd [name] is still having considerable weakness in both of his hands and Todd [name] has evidence of atrophy and ulnar distribution right greater than left.  His EMG and nerve conduction velocities show bilateral ulnar neuropathy, right greater than left with slowing at the elbow with superimposed diabetic polyneuropathy.   At this point given the severity of his weakness and with significant ulnar neuropathy at the elbow, I would recommend going ahead with right ulnar nerve decompression for ulnar neuropathy.  Todd [name] wants to do so and we will plan on doing this after October 15th.  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  We will plan on going ahead with the surgery after October 15th and we will see how Todd [name] does with that and make further recommendations regarding his left side.           Danae Orleans. Venetia Maxon, M.D./gde  cc: Dr. Merri Ray  Dr. Arnoldo Hooker  Dr. 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  Todd [name] says this began in November of 2011.  Todd [name] has used a walker since Todd [name] fell on 07/19/2010.  Todd [name] has additional medical problems of insulin dependent diabetes, hypertension, and coronary artery disease for which Todd [name] is wearing a monitor per Dr. Arnoldo Hooker at [facility].  Todd [name] has had a right ACL repair in 1985, left eye surgery x three in the past, cholecystectomy in 1988, heart stent 01/2006.  Todd [name] states that Todd [name] needs a left hip replacement.  Todd [name] is also awaiting right cataract surgery.  Todd [name] had an epidural steroid injection on 07/18/2010 which gave him some limited relief.    REVIEW OF SYSTEMS:   A detailed Review of Systems sheet was reviewed with the patient.  Pertinent positives include weight loss, wears glasses, cataracts, high blood pressure, high cholesterol, leg pain while walking, leg weakness, back pain, arm pain, leg pain, fainting spells or "blacking out", and diabetes.  All other systems are negative; this includes Ears, nose, mouth, throat, Respiratory, Gastrointestinal, Genitourinary, Integumentary & Breast, Psychiatric, Hematologic/Lymphatic, Allergic/Immunologic.    PAST MEDICAL HISTORY:      Current Medical Conditions:    Significant for hypertension.    Prior Operations and Hospitalizations:   As previously described above.  Medications and Allergies:  Medications - Amlodipine 5 mg. qd, Aspirin enteric-coated 81 mg. qd, Crestor 10 mg. q.h.s., Lantus 100 units two units q.h.s., NovoLog 7 units qam, 8 units at lunch and 8 units at supper, Sodium Bicarbonate 650 mg. two t.i.d., Toprol XL 25 mg. qd, Vitamin D3 1000 I.U. qd, and Vicodin 5/500 one-half po q4-6h.  Todd [name] has an ALLERGY TO VITAMIN D WHICH CAUSES WEIGHT GAIN.      Height and Weight:     Todd [name] is 5'9 " tall, 194 lbs.    FAMILY HISTORY:    His mother died at age 39 with diabetes.  His father died at age 74 of lung cancer and heart  disease.    SOCIAL HISTORY:    Todd [name] is a nonsmoker, nondrinker, and no history of substance abuse.    DIAGNOSTIC STUDIES:   Plain radiographs of his lumbar spine show that Todd [name] has multifactorial stenosis at L4-5 which is moderate to severe and also multifactorial and bilateral mild to moderate neural foraminal narrowing at L4-5 and to a lesser extent at the L5-S1 level.  Todd [name] has slight spondylolisthesis of L4 on 5 which is 2 mm. on extension and 3 mm. on flexion.    Plain radiographs of the hip were also reviewed which show that Todd [name] has bone on bone on his left hip with some sclerotic changes as well as cystic changes in the femoral head and acetabulum.    PHYSICAL EXAMINATION:      General Appearance:   On examination today, Todd [name] is a pleasant and cooperative man in no acute distress.     Blood Pressure, Pulse:     Blood pressure is 130/76.  Heart rate is 70 and regular.  Respiratory rate is 16.      HEENT - normocephalic, atraumatic.  The pupils are equal, round and reactive to light.  The extraocular muscles are intact.  Sclerae - white.  Conjunctiva - pink.  Oropharynx benign.  Uvula midline.     Neck - there are no masses, meningismus, deformities, tracheal deviation, jugular vein distention or carotid bruits.  There is normal cervical range of motion.  Spurlings' test is negative without reproducible radicular pain turning the patient's head to either side.  Lhermitte's sign is not present with axial compression.      Respiratory - there is normal respiratory effort with good intercostal function.  Lungs are clear to auscultation.  There are no rales, rhonchi or wheezes.      Cardiovascular - the heart has regular rate and rhythm to auscultation.  No murmurs are appreciated.  There is no extremity edema, cyanosis or clubbing.  There are palpable pedal pulses.    NEUROLOGICAL EXAMINATION: The patient is oriented to time, person and place and has good recall of both recent and remote memory  with normal attention span and concentration.    The patient speaks with clear and fluent speech and exhibits normal language function and appropriate fund of knowledge.      Cranial Nerve Examination - pupils are equal, round and reactive to light.  Extraocular movements are full.  Visual fields are full to confrontational testing.  Facial sensation and facial movement are symmetric and intact.  Hearing is intact to finger rub.  Palate is upgoing.  Shoulder shrug is symmetric.  Tongue protrudes in the midline.      Motor Examination - motor strength is 5/5 in the bilateral deltoids, biceps, triceps, handgrips, wrist extensors, interosseous, right hand intrinsics at  4/5.  In the lower extremities motor strength is 5/5 in hip flexion, extension, quadriceps, hamstrings, plantar flexion, dorsiflexion, 5/5 left extensor hallucis longus, and right extensor hallucis longus strength at 4/5.      Sensory Examination - Todd [name] has decreased pin sensation in his right fifth digit and lower extremity sensation is intact and symmetric.       Deep Tendon Reflexes - 1 in the biceps, triceps, and brachioradialis, 2 in the knees, 1 in the ankles.  The great toes are downgoing to plantar stimulation.    IMPRESSION AND RECOMMENDATIONS: Todd [name] is 70 year old diabetic man with severe spinal stenosis at L4-5.  I believe this is the basis for his leg pain and weakness and I have recommended that Todd [name] undergo L4-5 decompression. While Todd [name] does have some mild spondylolisthesis, this is not significant and I told him that we do not need to consider performing a fusion operation.    With regard to his hand, Todd [name] appears to have significant ulnar distribution weakness and I think this may either be secondary to an ulnar neuropathy vs. cervical radiculopathy and I would like to obtain bilateral upper extremity EMG and nerve conduction velocities to clarify that. We will proceed with scheduling surgery and the plan for EMG and nerve  conduction velocities in the near future.  Todd [name] is going to speak with his cardiologist about proceeding with surgery and get clearance for that.  Risks and benefits were discussed with the patient and Todd [name] wishes to proceed.   [facility]    Danae Orleans. Venetia Maxon, M.D. JDS:aft cc: Dr. Merri Ray  Dr. Arnoldo Hooker  Dr. Conchita Paris

## 2011-03-02 NOTE — Anesthesia Postprocedure Evaluation (Signed)
  Anesthesia Post-op Note  Patient: Todd [name]  Procedure(s) Performed:  ULNAR NERVE DECOMPRESSION/TRANSPOSITION - LEFT ulnar nerve decompression  Patient Location: PACU  Anesthesia Type: General  Level of Consciousness: awake  Airway and Oxygen Therapy: Patient connected to nasal cannula oxygen  Post-op Pain: mild  Post-op Assessment: Post-op Vital signs reviewed  Post-op Vital Signs: stable  Complications: No apparent anesthesia complications

## 2011-03-02 NOTE — Anesthesia Preprocedure Evaluation (Addendum)
Anesthesia Evaluation  Patient identified by MRN, date of birth, ID band Patient awake    Reviewed: Allergy & Precautions, H&P , NPO status , Patient's Chart, lab work & pertinent test results, reviewed documented beta blocker date and time   History of Anesthesia Complications (+) PONV  Airway Mallampati: II TM Distance: >3 FB     Dental No notable dental hx. (+) Teeth Intact   Pulmonary neg pulmonary ROS,          Cardiovascular hypertension, Pt. on medications + CAD     Neuro/Psych Negative Neurological ROS  Negative Psych ROS   GI/Hepatic negative GI ROS, Neg liver ROS,   Endo/Other  Diabetes mellitus-, Type 1, Insulin Dependent  Renal/GU negative Renal ROS  Genitourinary negative   Musculoskeletal negative musculoskeletal ROS (+)   Abdominal   Peds  Hematology negative hematology ROS (+)   Anesthesia Other Findings   Reproductive/Obstetrics negative OB ROS                           Anesthesia Physical Anesthesia Plan  ASA: III  Anesthesia Plan: General   Post-op Pain Management:    Induction: Intravenous  Airway Management Planned: LMA  Additional Equipment:   Intra-op Plan:   Post-operative Plan: Extubation in OR  Informed Consent: I have reviewed the patients History and Physical, chart, labs and discussed the procedure including the risks, benefits and alternatives for the proposed anesthesia with the patient or authorized representative who has indicated his/her understanding and acceptance.     Plan Discussed with: CRNA and Surgeon  Anesthesia Plan Comments:         Anesthesia Quick Evaluation

## 2011-03-02 NOTE — Progress Notes (Signed)
Subjective: Patient reports "I feel fine."  Objective: Vital signs in last 24 hours: Temp:  [97 F (36.1 C)-97.7 F (36.5 C)] 97.4 F (36.3 C) (12/13 1144) Pulse Rate:  [56-65] 61  (12/13 1144) Resp:  [12-27] 16  (12/13 1144) BP: (134-189)/(70-84) 139/73 mmHg (12/13 1144) SpO2:  [91 %-99 %] 96 % (12/13 1144)  Intake/Output from previous day:   Intake/Output this shift: Total I/O In: 1460 [P.O.:360; I.V.:1100] Out: -   Alert, conversant, ambulating from bathroom. Wife present. no c/o pain. Good strength L hand.  Lab Results: No results found for this basename: WBC:2,HGB:2,HCT:2,PLT:2 in the last 72 hours BMET No results found for this basename: NA:2,K:2,CL:2,CO2:2,GLUCOSE:2,BUN:2,CREATININE:2,CALCIUM:2 in the last 72 hours  Studies/Results: No results found.  Assessment/Plan: Drsg intact, dry. Good strength L hand.  LOS: 0 days  D/C iv, d/c to home. Pt will remove drsg in 3 days & shower/cleanse site gently with warm soapy water. rinse & dry. No lotions, creams, ointments. No lifting >5lbs . no driving [id number]. Call office for f/u in 3-4 weeks.Hydrocodone 5/325 1-2 po q6 hrs prn pain #60 Rx'ed by Dr. Venetia Maxon.   Georgiann Cocker 03/02/2011, 1:56 PM

## 2011-03-02 NOTE — Transfer of Care (Signed)
Immediate Anesthesia Transfer of Care Note  Patient: Todd [name]  Procedure(s) Performed:  ULNAR NERVE DECOMPRESSION/TRANSPOSITION - LEFT ulnar nerve decompression  Patient Location: PACU  Anesthesia Type: General  Level of Consciousness: awake and sedated  Airway & Oxygen Therapy: Patient Spontanous Breathing and Patient connected to nasal cannula oxygen  Post-op Assessment: Report given to PACU RN and Post -op Vital signs reviewed and stable  Post vital signs: Reviewed and stable  Complications: No apparent anesthesia complications

## 2011-03-03 ENCOUNTER — Encounter (HOSPITAL_COMMUNITY): Payer: Self-pay | Admitting: Neurosurgery

## 2011-03-03 NOTE — Discharge Summary (Signed)
Physician Discharge Summary  Patient ID: Todd [name] MRN: [id number] DOB/AGE: August 31, 1940 71 y.o.  Admit date: 03/02/2011 Discharge date: 03/03/2011  Admission Diagnoses:Left Ulnar Neuropathy  Discharge Diagnoses:  Active Problems:  * No active hospital problems. *    Discharged Condition: good  Hospital Course: Left Ulnar Neuropathy, underwent ulnar nerve decompression as an outpatient  Consults: none  Significant Diagnostic Studies: None  Treatments: surgery:  ulnar nerve decompression as an outpatient  Discharge Exam: Blood pressure 139/73, pulse 61, temperature 97.4 F (36.3 C), temperature source Oral, resp. rate 16, SpO2 96.00%. Incision/Wound:C/D/I  Disposition: Home or Self Care   Discharge Medication List as of 03/02/2011  3:41 PM    CONTINUE these medications which have NOT CHANGED   Details  amLODipine (NORVASC) 5 MG tablet Take 5 mg by mouth daily after breakfast.  , Until Discontinued, Historical Med    aspirin EC 81 MG tablet Take 81 mg by mouth daily.  , Until Discontinued, Historical Med    cholecalciferol (VITAMIN D) 1000 UNITS tablet Take 1,000 Units by mouth daily.  , Until Discontinued, Historical Med    HYDROcodone-acetaminophen (NORCO) 5-325 MG per tablet Take 1 tablet by mouth at bedtime.  , Until Discontinued, Historical Med    insulin aspart (NOVOLOG) 100 UNIT/ML injection Inject 7-9 Units into the skin 3 (three) times daily before meals. 7 units in a.m. 8 units before lunch and 9 units before dinner , Until Discontinued, Historical Med    insulin glargine (LANTUS) 100 UNIT/ML injection Inject 32 Units into the skin at bedtime.  , Until Discontinued, Historical Med    metoprolol tartrate (LOPRESSOR) 25 MG tablet Take 25 mg by mouth at bedtime.  , Until Discontinued, Historical Med    rosuvastatin (CRESTOR) 10 MG tablet Take 10 mg by mouth daily.  , Until Discontinued, Historical Med    sodium bicarbonate 650 MG tablet Take 1,300 mg by  mouth 3 (three) times daily.  , Until Discontinued, Historical Med         Signed: Shera Laubach D 03/03/2011, 12:29 PM

## 2011-08-27 IMAGING — CR DG CHEST 1V PORT
1 series · 1 of 1 positions shown · non-contrast
Comparison: none

REASON FOR EXAM: syncope
COMMENTS:

PROCEDURE:     DXR - DXR PORTABLE CHEST SINGLE VIEW  - July 20, 2010  [DATE]
RESULT:     Comparison is made to the prior exam of 05/20/2007.
The lung fields are clear. No pneumonia, pneumothorax or pleural effusion is
seen. The heart size is normal. Monitoring electrodes are present.

[view not recorded]
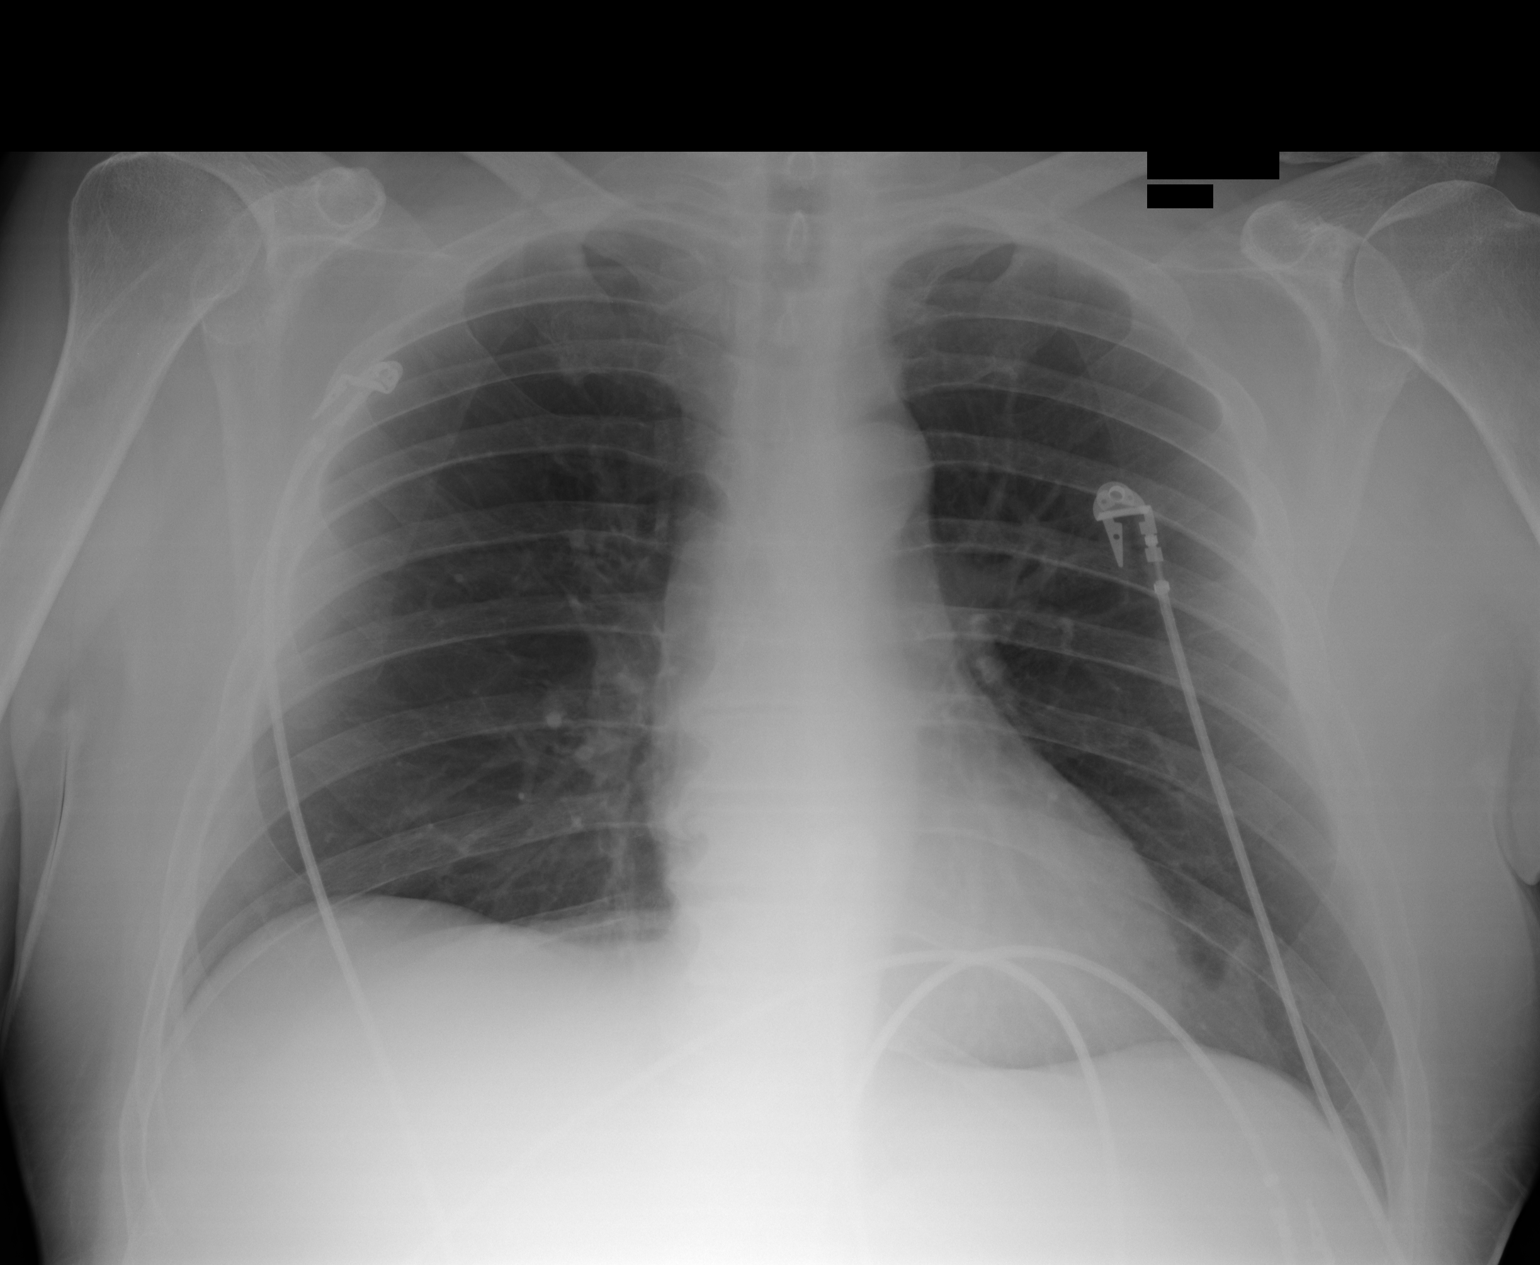

[1 of 1 positions shown; findings below may reference images not displayed]

IMPRESSION: No significant abnormalities are noted.

## 2011-09-04 ENCOUNTER — Ambulatory Visit: Payer: Self-pay | Admitting: General Practice

## 2011-09-04 LAB — BASIC METABOLIC PANEL
Anion Gap: 5 — ABNORMAL LOW (ref 7–16)
Calcium, Total: 8.3 mg/dL — ABNORMAL LOW (ref 8.5–10.1)
Chloride: 111 mmol/L — ABNORMAL HIGH (ref 98–107)
Co2: 29 mmol/L (ref 21–32)
EGFR (African American): 38 — ABNORMAL LOW
EGFR (Non-African Amer.): 33 — ABNORMAL LOW
Osmolality: 296 (ref 275–301)
Sodium: 145 mmol/L (ref 136–145)

## 2011-09-04 LAB — URINALYSIS, COMPLETE
Ketone: NEGATIVE
Leukocyte Esterase: NEGATIVE
Protein: >=500
Specific Gravity: 1.019 (ref 1.003–1.030)

## 2011-09-04 LAB — CBC
HCT: 38.8 % — ABNORMAL LOW (ref 40.0–52.0)
HGB: 13.4 g/dL (ref 13.0–18.0)
Platelet: 251 10*3/uL (ref 150–440)
RBC: 4.12 10*6/uL — ABNORMAL LOW (ref 4.40–5.90)

## 2011-09-04 LAB — PROTIME-INR: INR: 0.9

## 2011-09-06 LAB — URINE CULTURE

## 2011-09-18 ENCOUNTER — Inpatient Hospital Stay: Payer: Self-pay | Admitting: General Practice

## 2011-09-19 LAB — BASIC METABOLIC PANEL
BUN: 20 mg/dL — ABNORMAL HIGH (ref 7–18)
EGFR (African American): 43 — ABNORMAL LOW
EGFR (Non-African Amer.): 37 — ABNORMAL LOW
Glucose: 150 mg/dL — ABNORMAL HIGH (ref 65–99)
Osmolality: 283 (ref 275–301)
Potassium: 4 mmol/L (ref 3.5–5.1)

## 2011-09-19 LAB — PLATELET COUNT: Platelet: 190 10*3/uL (ref 150–440)

## 2011-09-20 LAB — BASIC METABOLIC PANEL
Anion Gap: 8 (ref 7–16)
BUN: 23 mg/dL — ABNORMAL HIGH (ref 7–18)
Creatinine: 2.12 mg/dL — ABNORMAL HIGH (ref 0.60–1.30)
EGFR (African American): 35 — ABNORMAL LOW
EGFR (Non-African Amer.): 31 — ABNORMAL LOW
Glucose: 178 mg/dL — ABNORMAL HIGH (ref 65–99)
Osmolality: 282 (ref 275–301)

## 2011-09-20 LAB — HEMOGLOBIN: HGB: 9.6 g/dL — ABNORMAL LOW (ref 13.0–18.0)

## 2013-11-10 ENCOUNTER — Ambulatory Visit: Payer: Self-pay | Admitting: Internal Medicine

## 2013-11-18 ENCOUNTER — Ambulatory Visit: Payer: Self-pay | Admitting: Internal Medicine

## 2014-02-28 ENCOUNTER — Observation Stay: Payer: Self-pay | Admitting: Internal Medicine

## 2014-02-28 LAB — URINALYSIS, COMPLETE
Bacteria: NONE SEEN
Bilirubin,UR: NEGATIVE
Glucose,UR: 150 mg/dL (ref 0–75)
KETONE: NEGATIVE
Leukocyte Esterase: NEGATIVE
Nitrite: NEGATIVE
Ph: 6 (ref 4.5–8.0)
Protein: >=500
Specific Gravity: 1.012 (ref 1.003–1.030)
Squamous Epithelial: <1
WBC UR: 1 /HPF (ref 0–5)

## 2014-02-28 LAB — COMPREHENSIVE METABOLIC PANEL
ANION GAP: 6 — AB (ref 7–16)
Albumin: 2.8 g/dL — ABNORMAL LOW (ref 3.4–5.0)
Alkaline Phosphatase: 81 U/L
BILIRUBIN TOTAL: 0.3 mg/dL (ref 0.2–1.0)
BUN: 47 mg/dL — ABNORMAL HIGH (ref 7–18)
CHLORIDE: 117 mmol/L — AB (ref 98–107)
CREATININE: 3.23 mg/dL — AB (ref 0.60–1.30)
Calcium, Total: 7.3 mg/dL — ABNORMAL LOW (ref 8.5–10.1)
Co2: 23 mmol/L (ref 21–32)
EGFR (African American): 24 — ABNORMAL LOW
GFR CALC NON AF AMER: 20 — AB
Glucose: 96 mg/dL (ref 65–99)
Osmolality: 303 (ref 275–301)
Potassium: 4.5 mmol/L (ref 3.5–5.1)
SGOT(AST): 57 U/L — ABNORMAL HIGH (ref 15–37)
SGPT (ALT): 38 U/L
Sodium: 146 mmol/L — ABNORMAL HIGH (ref 136–145)
TOTAL PROTEIN: 5.9 g/dL — AB (ref 6.4–8.2)

## 2014-02-28 LAB — CBC
HCT: 29.8 % — ABNORMAL LOW (ref 40.0–52.0)
HGB: 9.8 g/dL — AB (ref 13.0–18.0)
MCH: 31.6 pg (ref 26.0–34.0)
MCHC: 32.8 g/dL (ref 32.0–36.0)
MCV: 96 fL (ref 80–100)
Platelet: 195 10*3/uL (ref 150–440)
RBC: 3.09 10*6/uL — AB (ref 4.40–5.90)
RDW: 12.6 % (ref 11.5–14.5)
WBC: 8.8 10*3/uL (ref 3.8–10.6)

## 2014-02-28 LAB — CK-MB
CK-MB: 6.2 ng/mL — ABNORMAL HIGH (ref 0.5–3.6)
CK-MB: 6.4 ng/mL — AB (ref 0.5–3.6)

## 2014-02-28 LAB — TROPONIN I
Troponin-I: 0.03 ng/mL
Troponin-I: 0.02 ng/mL
Troponin-I: 0.05 ng/mL

## 2014-02-28 LAB — CK TOTAL AND CKMB (NOT AT ARMC)
CK, TOTAL: 407 U/L — AB (ref 39–308)
CK-MB: 6.1 ng/mL — ABNORMAL HIGH (ref 0.5–3.6)

## 2014-07-11 NOTE — Discharge Summary (Signed)
PATIENT NAME:  Todd [name], Todd [name] MR#:  [id number] DATE OF BIRTH:  Aug 17, 1940  DATE OF ADMISSION:  02/28/2014 DATE OF DISCHARGE:  02/28/2014  DISCHARGE DIAGNOSES: 1.  Chest pain, likely secondary to gastroesophageal reflux disease.  2.  Hypertension.  3.  Fall secondary to mechanical fall with right leg pain. 4.  Diabetes mellitus. 5.  Chronic kidney disease, stage III. 6.  Hyperlipidemia.   PRIMARY CARE PHYSICIAN: Dr. Ola Spurr.   DISCHARGE MEDICATIONS: 1.  Crestor 10 mg daily.  2.  Sodium bicarbonate 650 mg 2 tablets p.o. t.i.d.  3.  Amlodipine 5 mg p.o. daily.  4.  Lantus 32 units once a day at bedtime.  5.  NovoLog 4 liters at breakfast, 5 units at lunch, 9 units at supper.  6.  Metoprolol succinate 25 mg half tablet daily.  7.  Aspirin 81 mg a day.  8.  Lovenox 40 mg subcutaneous once a day for 14 days.  9.  Oxycodone 5 mg 1 to 2 tablets every 4 hours as needed for pain.  10.  Tramadol 50 mg 1 to 2 tablets every 4 hours as needed for pain.  11.  Percocet 5/325 one to two tablets every 6 hours for pain.   DISCHARGE INSTRUCTIONS: The patient can follow up with Dr. Nehemiah Massed, his primary cardiologist, for possible stress test.   CONSULTATIONS: None.   HOSPITAL COURSE: The patient is a 74 year old male with hypertension, hyperlipidemia, CAD with multiple stents, comes in because of fall. The patient was at granddaughter's concert and missed a step and had a fall, and he had right hip pain. Concerning the right hip pain, he was seen in the Emergency Room. The patient's imaging was negative for any fractures in the hip area, on the right side, but the patient started to have chest pain, 10 out of 10 in intensity, on the left side, so he was admitted to observation for chest pain rule out. EKG and troponins were negative x3. The patient's vitals were normal on admission. The patient continued on beta blockers, Crestor and aspirin. The patient's troponins were negative. EKG was unremarkable.  Next day he was chest pain free so we discharged him with possible stress test and follow up with Dr. Nehemiah Massed. The patient had right hip arthroplasty in 2013, so because of the right hip pain he was concerned if he had any fracture, but his x-rays and CT of the right hip showed no acute findings. The patient was discharged home in stable condition. His chest pain resolved. The patient advised to follow up with Dr. Nehemiah Massed as an outpatient. The patient's lab data was unremarkable, except CKD. He has CKD. Yesterday creatinine was around 3.23, BUN 47. The patient's CK total was 407, CPK-MB slightly elevated at 6.1. The patient's medications remained the same and we did not give any new prescriptions.  TIME SPENT ON DISCHARGE: More than 30 minutes.  ____________________________ Epifanio Lesches, MD sk:sb D: 03/03/2014 07:15:15 ET T: 03/03/2014 10:48:28 ET JOB#: [id number]  cc: Epifanio Lesches, MD, <Dictator> Cheral Marker. Ola Spurr, MD  Epifanio Lesches MD ELECTRONICALLY SIGNED 03/03/2014 22:55

## 2014-07-11 NOTE — H&P (Signed)
PATIENT NAME:  Todd [name], ANDUJO MR#:  [id number] DATE OF BIRTH:  1940/06/14  DATE OF ADMISSION:  02/28/2014  PRIMARY CARE PHYSICIAN: Cheral Marker. Ola Spurr, MD    REFERRING PHYSICIAN: Ahmed Prima, MD  CHIEF COMPLAINT: Chest pain.   HISTORY OF PRESENT ILLNESS: Mr. Todd [name] is a 74 year old male with a history of multiple medical problems including hypertension, hyperlipidemia, coronary artery disease with multiple stents placed in the past comes to the Emergency Department after having a fall at his granddaughter's concert after missing a step. He started to experience severe pain in the right hip. Concerning this, the patient was brought to the Emergency Department. Multiple x-rays done did not show any obvious fractures. The patient was discharged from the Emergency Department; however, the patient started to experience chest pain on the left side of the chest, 10/10 intensity, diaphoretic. Concerning about the patient's history of coronary artery disease the decision is made to admit and observe the patient further. Initial EKG, cardiac enzymes were unremarkable. The patient was given nitroglycerin and morphine with improvement of the chest pain. The patient, at baseline, participates in gym 3 times a week without having any chest pain, palpations, or shortness of breath. Denies having any lower extremity swelling. The patient currently denies having any chest pain.   PAST MEDICAL HISTORY:  1.  Diabetes mellitus on oral medication.  2.  Hypertension.  3.  Hyperlipidemia.  4.  Coronary artery disease, status post stent placement.  5.  Osteoarthritis.  6.  Chronic renal insufficiency.  7.  Diabetic retinopathy.  8.  Spinal stenosis.  9.  Pseudoaneurysm.  10.  Peripheral vascular disease.   PAST SURGICAL HISTORY:  1.  Cholecystectomy.  2.  Right total hip replacement.  3.  Elbow surgery.  4.  Left right eye cataract.  5.  Right knee replacement.   ALLERGIES: No known drug allergies.    HOME MEDICATIONS:  1.  Vitamin D 3000 units once a day.  2.  Tramadol 50 mg 1-2 tablets every 4 hours as needed.  3.  Sodium bicarbonate 650 mg 2 tablets 3 times a day.  4.  Oxycodone 5 mg 1-2 tablets every 4 hours as needed.  5.  NovoLog 40 units at breakfast, 5 units at lunch, and 9 units with supper.  6.  Toprol-XL 12.5 mg once a day.  7.  Lovenox.  8.  Lantus 32 units daily.  9.  Crestor 10 mg daily.  10.  Aspirin 81 mg daily.  11.  Amlodipine 5 mg daily.  12. Norco 5/325 mg 1-2 tablets every 6 hours as needed.   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 Married lives with his wife.   FAMILY HISTORY: Father died at the age of 72 from lung cancer and MI. Mother died at the age of 90 from diabetes mellitus.   REVIEW OF SYSTEMS:  CONSTITUTIONAL: Experiencing generalized weakness.  EYES: No change in vision.  EAR, NOSE, THROAT: No change in hearing.   RESPIRATORY: No cough, shortness of breath,  CARDIOVASCULAR: Had chest pain.  GASTROINTESTINAL: No nausea, vomiting, abdominal pain.  GENITOURINARY: No dysuria or hematuria.  HEMATOLOGIC: No easy bruising or bleeding.  SKIN: No rash or lesions.  MUSCULOSKELETAL: Has pain in the right hip.  SKIN: No rash or lesions.  NEUROLOGIC: No weakness or numbness in any part of the body.   PHYSICAL EXAMINATION:  GENERAL: This is a well-built, well-nourished, age-appropriate male lying down in the bed,  not in distress.  VITAL SIGNS: Temperature 97.9, pulse 78, blood pressure 157/69, respiratory rate of 15, oxygen saturation is 99% on 2 L of oxygen.  HEENT: Head normocephalic, atraumatic. There is no scleral icterus. Conjunctivae normal. Pupils equal and react to light. Mucous membranes moist. No pharyngeal erythema.  NECK: Supple. No lymphadenopathy. No JVD. No carotid bruit.  CHEST: Has no focal tenderness.  LUNGS: Bilaterally clear to auscultation.  HEART: S1, S2 regular. No murmurs are heard.   ABDOMEN: Bowel sounds present. Soft, nontender, nondistended. No hepatosplenomegaly.  EXTREMITIES: Has tenderness in the right hip area, mild decreased range of motion secondary to pain. Pulses 2+. No pedal edema.  SKIN: No rash or lesions.  NEUROLOGIC: The patient is alert, oriented to place, person, and time. Cranial nerves II-XII intact. Motor 5/5 in upper and lower extremities.   LABORATORY DATA: Urinalysis negative for nitrites and leukocyte esterase. Chest x-ray, one view portable, old left posterior rib fractures. CBC is completely within normal limits. CMP: BUN 47, creatinine of 3.23; the rest of the values are within normal limits. Initial CK-MB 6.1, CPK of 407. Troponin 0.03.   ASSESSMENT AND PLAN: Mr. Todd [name] is a 74 year old male who comes after having a fall and started to experience chest pain while in the Emergency Department.  1.  Chest pain: Considering the patient's history of coronary artery disease, admit the patient to a monitored bed, cycle cardiac enzymes x 3. The patient follows up with Dr. Nehemiah Massed. If cardiac enzymes are negative, the patient could be discharged home and have a stress test done as an outpatient. The patient at baseline is well active without having any chest pain. However, the patient's last stent placed in place to about 8 years back. Continue with aspirin, statin, beta blocker.  2.  Hypertension: Currently well controlled.  3.  Diabetes mellitus: Continue with Lantus home dose.  4.  Keep the patient on deep vein thrombosis prophylaxis with Lovenox.  5.  Right hip pain: The patient had multiple x-rays done, did not reveal any obvious fracture. This could be secondary to contusion. We will also involve physical therapy to ensure patient is able to walk prior to discharge.  6.  Chronic renal insufficiency: Patient seems to be at baseline.   TIME SPENT: 50 minutes.     ____________________________ Monica Becton, MD pv:bm D: 02/28/2014 05:15:55  ET T: 02/28/2014 05:53:43 ET JOB#: [id number]  cc: Monica Becton, MD, <Dictator> Cheral Marker. Ola Spurr, MD Corey Skains, MD Grier Mitts Quention Mcneill MD ELECTRONICALLY SIGNED 03/01/2014 21:09

## 2014-07-12 NOTE — Discharge Summary (Signed)
PATIENT NAME:  Todd [name], Todd [name] MR#:  [id number] DATE OF BIRTH:  1940-07-22  DATE OF ADMISSION:  09/18/2011 DATE OF DISCHARGE:  09/22/2011  ADMITTING DIAGNOSIS: Degenerative arthrosis of right hip.   DISCHARGE DIAGNOSIS: Degenerative arthrosis of right hip.  HISTORY: Patient is a pleasant 74 year old who has been followed at [facility] for discomfort to the right groin and hip region. The patient had previously underwent a lumbar surgery per Dr. Vertell Limber approximately one year ago and has had improvement in regards to his leg pain, however, he continued to have discomfort to the lateral hip region as well as pain with range of motion of the right hip. At the time of surgery he was not using any type of ambulatory aid. He was experiencing some night pain. He had noticed progressive loss in his range of motion of the right hip. On occasion the patient was taking Vicodin to help control was discomfort. The family states that he walks with a "wobbling state". He walks "like a duck". Patient states that the pain had increased to the point that it was significantly interfering with his activities of daily living. Patient did have x-rays taken in [facility] orthopedics which showed significant loss of the cartilage space to both hips with bone-on-bone articulation noted. There was subchondral sclerosis as well as osteophyte formation noted. After discussion of the risks and benefits of surgical intervention, the patient expressed his understanding of the risks and benefits and agreed for plans for surgical intervention.   PROCEDURE: Right total hip arthroplasty.   ANESTHESIA: Spinal.   IMPLANTS UTILIZED: DePuy 16.5 mm small statue Prodigy femoral component, 58 mm Pinnacle 100 acetabular component, a +4 mm 10 degree Pinnacle Marathon polyethylene liner, and a 36 aSphere femoral head with a +8.5 mm neck length.   HOSPITAL COURSE: Patient tolerated the procedure very well. He had no  complications. He was then taken to PAC-U where he was stabilized then transferred to the orthopedic floor. Patient began receiving anticoagulation therapy of Lovenox 30 mg sub-Q q.12 hours per anesthesia protocol. He was fitted with TED stockings bilaterally. These were allowed to be removed one hour per eight hour shift. The patient was also fitted with the AV-I compression foot pumps set at 80 mmHg bilaterally. His calves have been nontender. There has been no evidence of any deep venous thromboses. Heels were elevated off the bed using rolled towels.   Patient has denied any chest pains or any shortness of breath. Vital signs have been stable. He has been afebrile.   Physical therapy was initiated on day one for gait training and transfers. Was noted be a little lightheaded on day one. Complained of nausea on day two. On day three it was felt that he was a little drowsy during his ambulation and did not feel it was safe for him to go home at that time. However, upon being discharged he was ambulating greater than 200 feet. Was able go up and down four sets of steps. Was independent with bed to chair transfers. Occupational therapy was also initiated on day one for activities of daily living and assistive devices.   Patient's IV, Foley and Hemovac were all discontinued on day two along with a dressing change. The wound has been free of any drainage or any signs of infection.   DISPOSITION: Patient is being discharged to home in improved stable condition.   DISCHARGE INSTRUCTIONS:  1. He will continue weight-bearing as tolerated. Did go over hip precautions.  2.  Continue to use a walker until cleared by physical therapy to go to a quad cane.  3. He will receive home health physical therapy.  4. He was instructed in wound care. Staples will be removed in two weeks. He is not to take a shower until the staples are removed.  5. He is to call the clinic if any temperature of 101.5 or greater or  excessive bleeding.  6. He is placed on an ADA diet.  7. Was given a prescription for Lovenox 40 mg sub-Q daily for 14 days, then discontinue and begin taking one 81 mg enteric-coated aspirin. Prescription for oxycodone 1 to 2 tablets every 4 to 6 hours p.r.n. for pain. He is to resume his regular medications that he was on prior to admission.  8. He has a follow-up appointment in the clinic in six weeks.   PAST MEDICAL HISTORY:  1. Hypertension.  2. Hypercholesterolemia.  3. Diabetes.  4. Sinusitis.  5. Diabetic retinopathy with hemorrhage. 6. Helicobacter pylori gastric infection.  7. Ischemic heart disease.  8. Renal insufficiency.  ____________________________ Vance Peper, PA jrw:cms D: 09/22/2011 07:34:49 ET T: 09/22/2011 16:42:52 ET JOB#: [id number]  cc: Vance Peper, PA, <Dictator>  JON WOLFE PA ELECTRONICALLY SIGNED 09/22/2011 20:58

## 2014-07-12 NOTE — Op Note (Signed)
PATIENT NAME:  Todd [name], Todd [name] MR#:  [id number] DATE OF BIRTH:  Sep 28, 1940  DATE OF PROCEDURE:  09/18/2011  PREOPERATIVE DIAGNOSIS: Degenerative arthrosis of the right hip.   POSTOPERATIVE DIAGNOSIS: Degenerative arthrosis of the right hip.  PROCEDURE PERFORMED: Right total hip arthroplasty.   SURGEON: Laurice Record. Hooten, M.D.   ASSISTANT: Vance Peper, PA-C (required to maintain retraction throughout the procedure)   ANESTHESIA: Spinal.   ESTIMATED BLOOD LOSS: 250 mL.   FLUIDS REPLACED: 2200 mL of crystalloid.   DRAINS: Two medium drains to Hemovac reservoir.   IMPLANTS UTILIZED: DePuy 16.5 mm small stature Prodigy femoral component, 58 mm Pinnacle 100 acetabular component, a +4 mm 10 degree Pinnacle Marathon polyethylene liner, and a 36 mm ASPHERE femoral head with a +8.5 mm neck length.   INDICATIONS FOR SURGERY: The patient is a 74 year old male who has been seen for complaints of right hip and groin pain. X-rays demonstrated severe degenerative changes. After discussion of the risks and benefits of surgical intervention, the patient expressed his understanding of the risks and benefits and agreed with plans for surgical intervention.   PROCEDURE IN DETAIL: The patient was brought to the Operating Room and, after adequate spinal anesthesia was achieved, the patient was placed in a left lateral decubitus position. Axillary roll was placed and all bony prominences were well padded. The patient's right hip and leg were cleaned and prepped with alcohol and DuraPrep and draped in the usual sterile fashion. A "time out" was performed as per usual protocol. A lateral curvilinear incision was made gently curving towards the posterior superior iliac spine. The IT band was incised in line with the skin incision and the fibers of the gluteus maximus were split in line. The piriformis tendon was identified, skeletonized, and incised at its insertion at the proximal femur and reflected posteriorly. In a  similar fashion, short external rotators were incised and reflected posteriorly. A T-type posterior capsulotomy was performed. Prior to dislocation of the femoral head, a threaded Steinmann pin was inserted through a separate stab incision into the pelvis superior to the acetabulum and bent in the form of a stylus so as to assess limb length and hip offset throughout the procedure. The femoral head was then dislocated posteriorly. Severe degenerative changes were noted. Femoral neck cut was performed using an oscillating saw. The capsule was elevated off of the anterior aspect of the femoral neck. Acetabulum was inspected and there were also noted to be significant degenerative changes. Remnant of the labrum was excised. The acetabulum was then reamed in a sequential fashion up to a 57 mm diameter. Good punctate bleeding bone was encountered. Two small cystic lesions were curettaged and packed with cancellous bone shavings. A 58 mm Pinnacle 100 acetabular component was positioned and impacted into place. Excellent scratch fit was achieved. A polyethylene trial was inserted and attention was directed to the proximal femur. Pilot hole for reaming of the proximal femoral canal was created using a high-speed bur. Proximal femoral canal was reamed in a sequential fashion up to a 16 mm diameter. This allowed for approximately 5.5 cm of scratch fit. The proximal femur was then prepared using a 16.5 mm aggressive side-biting reamer. Serial broaches were inserted up to a 16.5 mm small stature broach. The calcar region was planed and trial reduction was performed. There was some relative decrease in hip offset. The trial polyethylene was removed and replaced with a +4 mm, 10 degree polyethylene trial with the high side positioned at approximately the  8 o'clock position. Trial reduction was again performed with first an AML head and neck segment and subsequently a Prodigy neck segment with first a +5 and subsequently a +8.5 mm  neck length. This allowed for restoration of the hip offset and excellent equalization of limb lengths. Good stability was appreciated both anteriorly and posteriorly. Trial components were removed. The metal shell was irrigated with normal saline with antibiotic solution and then suctioned dry. A +4 mm, 10 degree Pinnacle Marathon polyethylene liner was positioned with the high side at approximately the 8 o'clock position and impacted into place. Next, a 16.5 mm small stature Prodigy femoral component was positioned and impacted into place. Excellent scratch fit was appreciated. Trial reduction was again performed with a +8.5 mm neck length. Again, good restoration of hip offset and equalization of limb lengths was appreciated. The hip was stable both in anterior and posterior directions. Trial hip ball was removed. The Morse taper was cleaned and dried. A 36 mm ASPHERE femoral head with a +8.5 mm neck length was placed on the trunnion and impacted into place. The hip was reduced and placed through a range of motion. Again, excellent stability was appreciated anteriorly and posteriorly. The wound was irrigated with copious amounts of normal saline with antibiotic solution using pulsatile lavage and then suctioned dry. Good hemostasis was appreciated. The posterior capsulotomy was repaired using #5 Ethibond. The piriformis tendon was reapproximated on the undersurface of the gluteus medius tendon using #5 Ethibond. Two medium drains were placed in the wound bed and brought out through a separate stab incision to be attached to a Hemovac reservoir. IT band was repaired using interrupted sutures of #1 Vicryl. The subcutaneous tissue was approximated in layers using first #0 Vicryl followed by #2-0 Vicryl. The skin was closed with skin staples. A sterile dressing was applied.   The patient tolerated the procedure well. He was transported to the recovery room in stable condition.  ____________________________ Laurice Record. Holley Bouche., MD jph:slb D: 09/18/2011 10:59:59 ET T: 09/18/2011 11:10:57 ET JOB#: [id number]  cc: Jeneen Rinks P. Holley Bouche., MD, <Dictator> Laurice Record Holley Bouche MD ELECTRONICALLY SIGNED 09/18/2011 19:04

## 2015-01-24 ENCOUNTER — Emergency Department
Admission: EM | Admit: 2015-01-24 | Discharge: 2015-01-24 | Disposition: A | Discharge status: Home / Self Care | Payer: Medicare Other | Source: Home / Self Care | Attending: Emergency Medicine | Admitting: Emergency Medicine

## 2015-01-24 ENCOUNTER — Emergency Department: Payer: Medicare Other

## 2015-01-24 ENCOUNTER — Encounter: Payer: Self-pay | Admitting: Emergency Medicine

## 2015-01-24 ENCOUNTER — Inpatient Hospital Stay
Admission: EM | Admit: 2015-01-24 | Discharge: 2015-01-27 | DRG: 392 | Disposition: A | Discharge status: Home / Self Care | Payer: Medicare Other | Attending: Internal Medicine | Admitting: Internal Medicine

## 2015-01-24 ENCOUNTER — Encounter: Payer: Self-pay | Admitting: *Deleted

## 2015-01-24 DIAGNOSIS — I129 Hypertensive chronic kidney disease with stage 1 through stage 4 chronic kidney disease, or unspecified chronic kidney disease: Secondary | ICD-10-CM | POA: Diagnosis present

## 2015-01-24 DIAGNOSIS — R1110 Vomiting, unspecified: Secondary | ICD-10-CM | POA: Insufficient documentation

## 2015-01-24 DIAGNOSIS — Z888 Allergy status to other drugs, medicaments and biological substances status: Secondary | ICD-10-CM

## 2015-01-24 DIAGNOSIS — E785 Hyperlipidemia, unspecified: Secondary | ICD-10-CM | POA: Diagnosis present

## 2015-01-24 DIAGNOSIS — I2510 Atherosclerotic heart disease of native coronary artery without angina pectoris: Secondary | ICD-10-CM | POA: Diagnosis present

## 2015-01-24 DIAGNOSIS — R112 Nausea with vomiting, unspecified: Secondary | ICD-10-CM

## 2015-01-24 DIAGNOSIS — M25569 Pain in unspecified knee: Secondary | ICD-10-CM | POA: Diagnosis present

## 2015-01-24 DIAGNOSIS — M549 Dorsalgia, unspecified: Secondary | ICD-10-CM | POA: Diagnosis present

## 2015-01-24 DIAGNOSIS — N184 Chronic kidney disease, stage 4 (severe): Secondary | ICD-10-CM | POA: Diagnosis present

## 2015-01-24 DIAGNOSIS — Z79899 Other long term (current) drug therapy: Secondary | ICD-10-CM

## 2015-01-24 DIAGNOSIS — E119 Type 2 diabetes mellitus without complications: Secondary | ICD-10-CM | POA: Diagnosis present

## 2015-01-24 DIAGNOSIS — B179 Acute viral hepatitis, unspecified: Secondary | ICD-10-CM | POA: Insufficient documentation

## 2015-01-24 DIAGNOSIS — Z7982 Long term (current) use of aspirin: Secondary | ICD-10-CM

## 2015-01-24 DIAGNOSIS — K529 Noninfective gastroenteritis and colitis, unspecified: Secondary | ICD-10-CM

## 2015-01-24 DIAGNOSIS — Z955 Presence of coronary angioplasty implant and graft: Secondary | ICD-10-CM

## 2015-01-24 DIAGNOSIS — Z79891 Long term (current) use of opiate analgesic: Secondary | ICD-10-CM

## 2015-01-24 DIAGNOSIS — R109 Unspecified abdominal pain: Secondary | ICD-10-CM

## 2015-01-24 DIAGNOSIS — K567 Ileus, unspecified: Secondary | ICD-10-CM | POA: Insufficient documentation

## 2015-01-24 DIAGNOSIS — M25529 Pain in unspecified elbow: Secondary | ICD-10-CM | POA: Diagnosis present

## 2015-01-24 DIAGNOSIS — E039 Hypothyroidism, unspecified: Secondary | ICD-10-CM | POA: Diagnosis present

## 2015-01-24 DIAGNOSIS — R1084 Generalized abdominal pain: Secondary | ICD-10-CM

## 2015-01-24 DIAGNOSIS — Z794 Long term (current) use of insulin: Secondary | ICD-10-CM

## 2015-01-24 DIAGNOSIS — Z9049 Acquired absence of other specified parts of digestive tract: Secondary | ICD-10-CM

## 2015-01-24 DIAGNOSIS — Z7984 Long term (current) use of oral hypoglycemic drugs: Secondary | ICD-10-CM

## 2015-01-24 LAB — COMPREHENSIVE METABOLIC PANEL
ALK PHOS: 83 U/L (ref 38–126)
ALT: 18 U/L (ref 17–63)
ANION GAP: 7 (ref 5–15)
AST: 24 U/L (ref 15–41)
Albumin: 3.6 g/dL (ref 3.5–5.0)
BILIRUBIN TOTAL: 0.5 mg/dL (ref 0.3–1.2)
BUN: 57 mg/dL — ABNORMAL HIGH (ref 6–20)
CALCIUM: 8.4 mg/dL — AB (ref 8.9–10.3)
CO2: 20 mmol/L — ABNORMAL LOW (ref 22–32)
Chloride: 116 mmol/L — ABNORMAL HIGH (ref 101–111)
Creatinine, Ser: 3.83 mg/dL — ABNORMAL HIGH (ref 0.61–1.24)
GFR calc non Af Amer: 14 mL/min — ABNORMAL LOW (ref >60)
GFR, EST AFRICAN AMERICAN: 17 mL/min — AB (ref >60)
Glucose, Bld: 229 mg/dL — ABNORMAL HIGH (ref 65–99)
POTASSIUM: 5.9 mmol/L — AB (ref 3.5–5.1)
SODIUM: 143 mmol/L (ref 135–145)
TOTAL PROTEIN: 6.8 g/dL (ref 6.5–8.1)

## 2015-01-24 LAB — CBC WITH DIFFERENTIAL/PLATELET
BASOS ABS: 0 10*3/uL (ref 0–0.1)
BASOS PCT: 1 %
EOS ABS: 0 10*3/uL (ref 0–0.7)
EOS PCT: 1 %
HCT: 30.6 % — ABNORMAL LOW (ref 40.0–52.0)
Hemoglobin: 10.1 g/dL — ABNORMAL LOW (ref 13.0–18.0)
Lymphocytes Relative: 8 %
Lymphs Abs: 0.5 10*3/uL — ABNORMAL LOW (ref 1.0–3.6)
MCH: 31.3 pg (ref 26.0–34.0)
MCHC: 33.1 g/dL (ref 32.0–36.0)
MCV: 94.4 fL (ref 80.0–100.0)
Monocytes Absolute: 0.6 10*3/uL (ref 0.2–1.0)
Monocytes Relative: 9 %
NEUTROS PCT: 81 %
Neutro Abs: 5.8 10*3/uL (ref 1.4–6.5)
PLATELETS: 191 10*3/uL (ref 150–440)
RBC: 3.24 MIL/uL — AB (ref 4.40–5.90)
RDW: 12.8 % (ref 11.5–14.5)
WBC: 7.1 10*3/uL (ref 3.8–10.6)

## 2015-01-24 LAB — LIPASE, BLOOD: Lipase: 32 U/L (ref 11–51)

## 2015-01-24 LAB — CBC
HEMATOCRIT: 31 % — AB (ref 40.0–52.0)
HEMOGLOBIN: 10.6 g/dL — AB (ref 13.0–18.0)
MCH: 32.2 pg (ref 26.0–34.0)
MCHC: 34.1 g/dL (ref 32.0–36.0)
MCV: 94.2 fL (ref 80.0–100.0)
Platelets: 189 10*3/uL (ref 150–440)
RBC: 3.29 MIL/uL — ABNORMAL LOW (ref 4.40–5.90)
RDW: 12.8 % (ref 11.5–14.5)
WBC: 8.7 10*3/uL (ref 3.8–10.6)

## 2015-01-24 LAB — TROPONIN I

## 2015-01-24 MED ORDER — ONDANSETRON HCL 4 MG/2ML IJ SOLN
4.0000 mg | Freq: Once | INTRAMUSCULAR | Status: AC
  Administered 2015-01-24: 4 mg via INTRAVENOUS
  Filled 2015-01-24: qty 2

## 2015-01-24 MED ORDER — CIPROFLOXACIN IN D5W 400 MG/200ML IV SOLN
400.0000 mg | Freq: Once | INTRAVENOUS | Status: AC
  Administered 2015-01-24: 400 mg via INTRAVENOUS
  Filled 2015-01-24: qty 200

## 2015-01-24 MED ORDER — MORPHINE SULFATE (PF) 4 MG/ML IV SOLN
4.0000 mg | Freq: Once | INTRAVENOUS | Status: AC
  Administered 2015-01-24: 4 mg via INTRAVENOUS

## 2015-01-24 MED ORDER — SODIUM CHLORIDE 0.9 % IV BOLUS (SEPSIS)
1000.0000 mL | Freq: Once | INTRAVENOUS | Status: AC
  Administered 2015-01-24: 1000 mL via INTRAVENOUS

## 2015-01-24 MED ORDER — MORPHINE SULFATE (PF) 4 MG/ML IV SOLN
4.0000 mg | Freq: Once | INTRAVENOUS | Status: AC
  Administered 2015-01-24: 4 mg via INTRAVENOUS
  Filled 2015-01-24: qty 1

## 2015-01-24 MED ORDER — ONDANSETRON HCL 4 MG/2ML IJ SOLN
INTRAMUSCULAR | Status: AC
  Administered 2015-01-24: 4 mg via INTRAVENOUS
  Filled 2015-01-24: qty 2

## 2015-01-24 MED ORDER — ALBUTEROL SULFATE (2.5 MG/3ML) 0.083% IN NEBU
2.5000 mg | INHALATION_SOLUTION | Freq: Once | RESPIRATORY_TRACT | Status: AC
  Administered 2015-01-24: 2.5 mg via RESPIRATORY_TRACT
  Filled 2015-01-24: qty 3

## 2015-01-24 MED ORDER — INSULIN ASPART 100 UNIT/ML ~~LOC~~ SOLN
10.0000 [IU] | Freq: Once | SUBCUTANEOUS | Status: AC
  Administered 2015-01-24: 10 [IU] via INTRAVENOUS
  Filled 2015-01-24: qty 10

## 2015-01-24 MED ORDER — METRONIDAZOLE IN NACL 5-0.79 MG/ML-% IV SOLN
500.0000 mg | Freq: Once | INTRAVENOUS | Status: DC
  Filled 2015-01-24: qty 100

## 2015-01-24 MED ORDER — ONDANSETRON HCL 4 MG/2ML IJ SOLN
4.0000 mg | INTRAMUSCULAR | Status: AC
  Administered 2015-01-24: 4 mg via INTRAVENOUS
  Filled 2015-01-24: qty 2

## 2015-01-24 MED ORDER — ONDANSETRON HCL 4 MG/2ML IJ SOLN
INTRAMUSCULAR | Status: AC
Start: 2015-01-24 — End: 2015-01-25
  Filled 2015-01-24: qty 2

## 2015-01-24 MED ORDER — SODIUM BICARBONATE 8.4 % IV SOLN
50.0000 meq | Freq: Once | INTRAVENOUS | Status: AC
  Administered 2015-01-24: 50 meq via INTRAVENOUS
  Filled 2015-01-24: qty 50

## 2015-01-24 MED ORDER — ONDANSETRON HCL 4 MG/2ML IJ SOLN
4.0000 mg | Freq: Once | INTRAMUSCULAR | Status: AC
  Administered 2015-01-24: 4 mg via INTRAVENOUS

## 2015-01-24 MED ORDER — CIPROFLOXACIN HCL 500 MG PO TABS: 500.0000 mg | ORAL_TABLET | Freq: Two times a day (BID) | ORAL | Status: DC

## 2015-01-24 MED ORDER — METRONIDAZOLE 500 MG PO TABS: 500.0000 mg | ORAL_TABLET | Freq: Two times a day (BID) | ORAL | Status: DC

## 2015-01-24 MED ORDER — SODIUM POLYSTYRENE SULFONATE 15 GM/60ML PO SUSP
15.0000 g | Freq: Once | ORAL | Status: AC
  Administered 2015-01-24: 15 g via ORAL
  Filled 2015-01-24: qty 60

## 2015-01-24 MED ORDER — DEXTROSE 50 % IV SOLN
1.0000 | Freq: Once | INTRAVENOUS | Status: AC
  Administered 2015-01-24: 50 mL via INTRAVENOUS
  Filled 2015-01-24: qty 50

## 2015-01-24 MED ORDER — MORPHINE SULFATE (PF) 4 MG/ML IV SOLN
INTRAVENOUS | Status: AC
  Administered 2015-01-24: 4 mg via INTRAVENOUS
  Filled 2015-01-24: qty 1

## 2015-01-24 MED ORDER — SODIUM CHLORIDE 0.9 % IV SOLN
Freq: Once | INTRAVENOUS | Status: AC
  Administered 2015-01-24: via INTRAVENOUS

## 2015-01-24 MED ORDER — IOHEXOL 240 MG/ML SOLN
25.0000 mL | INTRAMUSCULAR | Status: AC
  Administered 2015-01-24: 25 mL via ORAL

## 2015-01-24 MED ORDER — METRONIDAZOLE 500 MG PO TABS
500.0000 mg | ORAL_TABLET | Freq: Once | ORAL | Status: AC
  Administered 2015-01-24: 500 mg via ORAL
  Filled 2015-01-24: qty 1

## 2015-01-24 NOTE — ED Notes (Signed)
Pt presents to ED via EMS from personal home with c/o of mid-abdominal pain. EMS states pain is located in central abdomen area, without radiating to another site. EMS states pt has a hx of DM and HTN. EMS states presenting sx began early last evening approximately at 11 PM. Pt arrived to ER alert and oriented x4, able to follow commands appropriately.

## 2015-01-24 NOTE — ED Notes (Signed)
Pt to triage via wheelchair.  Pt has abd pain and vomiting.  Pt seen in ER earlier today with similar sx.  Pt states vomiting and pain worse since 1600 today.

## 2015-01-24 NOTE — ED Provider Notes (Signed)
Patient's Cipro was running relatively slow due to small vein in the hand. Patient is feeling better and wants to go home. He is stable for outpatient management. He was told to stay hydrated. IV Flagyl order was changed to by mouth Flagyl.  We discussed return precautions. Patient is to follow-up with his primary care physician as well as his renal physician.  Lisa Roca, MD 01/24/15 [phone]

## 2015-01-24 NOTE — ED Provider Notes (Signed)
[facility] Emergency Department Provider Note  ____________________________________________  Time seen: Approximately 327 AM  I have reviewed the triage vital signs and the nursing notes.   HISTORY  Chief Complaint Abdominal Pain    HPI Todd [name] is a 74 y.o. male who comes into the hospital with some abdominal pain. This pain started as the patient was getting ready for bed this evening around 11pm. The patient reports that the pain is mid to left abd pain and is dull in quality. The patient rates that pain as a 6/10 in intensity. The patient did not take anything for the pain but as it persisted he came in for evaluation; He reports that he did vomit 2-3 times . It was the food he had eaten earlier and then it was clear. The patient denies any diarrhea, chest pain or SOB. The patient has also not had any fevers, back pain or problems with urination.    Past Medical History  Diagnosis Date  . PONV (postoperative nausea and vomiting)   . Hyperlipidemia     takes Crestor daily  . Hypertension     takes Amlodipine and Metoprolol  . Coronary artery disease   . 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 Joint pain     knees,elbows,back  . Diabetes mellitus     lantus and novolog;fasting sugars run 70-120    There are no active problems to display for this patient.   Past Surgical History  Procedure Laterality Date  . Knee arthroscopy  80's    right knee  . Cholecystectomy  20+yrs ago  . Cardiac catheterization  01/2006    2 stents placed  . Back surgery  09/15/10  . Cataract extraction  12/21/10--02/01/11    right and left  . Elbow surgery  2012    right elbow  . Colonoscopy    . Ulnar nerve transposition  03/02/2011    Procedure: ULNAR NERVE DECOMPRESSION/TRANSPOSITION;  Surgeon: Peggyann Shoals, MD;  Location: [place] [facility];  Service: Neurosurgery;  Laterality: Left;  LEFT ulnar nerve decompression    Current Outpatient Rx  Name  Route  Sig   Dispense  Refill  . amLODipine (NORVASC) 5 MG tablet   Oral   Take 5 mg by mouth daily after breakfast.           . aspirin EC 81 MG tablet   Oral   Take 81 mg by mouth daily.           . cholecalciferol (VITAMIN D) 1000 UNITS tablet   Oral   Take 1,000 Units by mouth daily.           . ciprofloxacin (CIPRO) 500 MG tablet   Oral   Take 1 tablet (500 mg total) by mouth 2 (two) times daily.   14 tablet   0   . HYDROcodone-acetaminophen (NORCO) 5-325 MG per tablet   Oral   Take 1 tablet by mouth at bedtime.           . insulin aspart (NOVOLOG) 100 UNIT/ML injection   Subcutaneous   Inject 7-9 Units into the skin 3 (three) times daily before meals. 7 units in a.m. 8 units before lunch and 9 units before dinner          . insulin glargine (LANTUS) 100 UNIT/ML injection   Subcutaneous   Inject 32 Units into the skin at bedtime.           . metoprolol  tartrate (LOPRESSOR) 25 MG tablet   Oral   Take 25 mg by mouth at bedtime.           . metroNIDAZOLE (FLAGYL) 500 MG tablet   Oral   Take 1 tablet (500 mg total) by mouth 2 (two) times daily.   14 tablet   0   . rosuvastatin (CRESTOR) 10 MG tablet   Oral   Take 10 mg by mouth daily.           . sodium bicarbonate 650 MG tablet   Oral   Take 1,300 mg by mouth 3 (three) times daily.             Allergies Vitamin d analogs  Family History  Problem Relation Age of Onset  . Anesthesia problems Neg Hx   . Hypotension Neg Hx   . Malignant hyperthermia Neg Hx   . Pseudochol deficiency Neg Hx     Social History Social History  Substance Use Topics  . Smoking status: Never Smoker   . Smokeless tobacco: None  . Alcohol Use: No    Review of Systems Constitutional: No fever/chills Eyes: No visual changes. ENT: No sore throat. Cardiovascular: Denies chest pain. Respiratory: Denies shortness of breath. Gastrointestinal: Abdominal pain and vomiting No diarrhea.  No constipation. Genitourinary:  Negative for dysuria. Musculoskeletal: Negative for back pain. Skin: Negative for rash. Neurological: Negative for headaches, focal weakness or numbness.  10-point ROS otherwise negative.  ____________________________________________   PHYSICAL EXAM:  VITAL SIGNS: ED Triage Vitals  Enc Vitals Group     BP 01/24/15 0328 177/80 mmHg     Pulse Rate 01/24/15 0328 90     Resp 01/24/15 0328 20     Temp 01/24/15 0328 98.4 F (36.9 C)     Temp Source 01/24/15 0328 Oral     SpO2 01/24/15 0328 99 %     Weight 01/24/15 0328 204 lb (92.534 kg)     Height 01/24/15 0328 5\' 9"  (1.753 m)     Head Cir --      Peak Flow --      Pain Score 01/24/15 0333 6     Pain Loc --      Pain Edu? --      Excl. in [place]? --     Constitutional: Alert and oriented. Well appearing and in mild distress. Eyes: Conjunctivae are normal. PERRL. EOMI. Head: Atraumatic. Nose: No congestion/rhinnorhea. Mouth/Throat: Mucous membranes are moist.  Oropharynx non-erythematous. Cardiovascular: Normal rate, regular rhythm. Grossly normal heart sounds.  Good peripheral circulation. Respiratory: Normal respiratory effort.  No retractions. Lungs CTAB. Gastrointestinal: Soft with epigastric and mid abd tenderness to palpation. No distention. Positive bowel sounds Musculoskeletal: No lower extremity tenderness nor edema.   Neurologic:  Normal speech and language. No gross focal neurologic deficits are appreciated.  Skin:  Skin is warm, dry and intact.  Psychiatric: Mood and affect are normal.  ____________________________________________   LABS (all labs ordered are listed, but only abnormal results are displayed)  Labs Reviewed  CBC - Abnormal; Notable for the following:    RBC 3.29 (*)    Hemoglobin 10.6 (*)    HCT 31.0 (*)    All other components within normal limits  COMPREHENSIVE METABOLIC PANEL - Abnormal; Notable for the following:    Potassium 5.9 (*)    Chloride 116 (*)    CO2 20 (*)    Glucose, Bld 229  (*)    BUN 57 (*)    Creatinine, Ser  3.83 (*)    Calcium 8.4 (*)    GFR calc non Af Amer 14 (*)    GFR calc Af Amer 17 (*)    All other components within normal limits  LIPASE, BLOOD  TROPONIN I   ____________________________________________  EKG  ED ECG REPORT I, Loney Hering, the attending physician, personally viewed and interpreted this ECG.   Date: 01/24/2015  EKG Time: 326  Rate: 86  Rhythm: normal sinus rhythm  Axis: normal  Intervals:none  ST&T Change: flipped t waves I and avl  ____________________________________________  RADIOLOGY  CT abd and pelvis: Small cvolumes of fluid adjacent to some loops of bowel in the right side of the abdomen. This is non specific but could indicate a focus of enteritis, with some reactive free fluid adjacent to inflamed loops of small bowel. Small ventral hernia in the epigastric region containing only omental fat, 72mm left lower lobe pulmonary nodule ____________________________________________   PROCEDURES  Procedure(s) performed: None  Critical Care performed: No  ____________________________________________   INITIAL IMPRESSION / ASSESSMENT AND PLAN / ED COURSE  Pertinent labs & imaging results that were available during my care of the patient were reviewed by me and considered in my medical decision making (see chart for details).  This is a 74 year old male who comes in today with some abdominal pain. This pain started earlier in the evening but has been persistent with some vomiting as well. The patient's blood work is unremarkable except for his potassium of 5.9 and his creatinine of 3.83. The patient does have kidney disease and that is within the realm of the patient's kidney function but his potassium is elevated. I will give him a dose of sodium bicarbonate, insulin and dextrose as well as an albuterol treatment. I will await the [facility] ancillary see the results of the patient's CT scan. The patient did  receive 4 of morphine and 4 of Zofran for vomiting in the ED as well as a liter of normal saline.  The patient's care will be signed out to Dr. Reita Cliche who will follow up the patient after her receives his medication and ensure that he is able to tolerate po prior to d/c  ____________________________________________   FINAL CLINICAL IMPRESSION(S) / ED DIAGNOSES  Final diagnoses:  Enteritis  Non-intractable vomiting with nausea, vomiting of unspecified type      Loney Hering, MD 01/24/15 [phone]

## 2015-01-24 NOTE — ED Notes (Signed)
Pt states that he was seen earlier today and discharged home with enteritis. Pt started hurting worse around 1600. While at bedside audible bowel sounds are heard without use of the stethescope. Pt points to the center of his abd when describing the location of his pain, pt states that he is nauseated, denies diarrhea, pt states that his abd is tender when he places his hand on it

## 2015-01-24 NOTE — Discharge Instructions (Signed)
Nausea and Vomiting Nausea means you feel sick to your stomach. Throwing up (vomiting) is a reflex where stomach contents come out of your mouth. HOME CARE   Take medicine as told by your doctor.  Do not force yourself to eat. However, you do need to drink fluids.  If you feel like eating, eat a normal diet as told by your doctor.  Eat rice, wheat, potatoes, bread, lean meats, yogurt, fruits, and vegetables.  Avoid high-fat foods.  Drink enough fluids to keep your pee (urine) clear or pale yellow.  Ask your doctor how to replace body fluid losses (rehydrate). Signs of body fluid loss (dehydration) include:  Feeling very thirsty.  Dry lips and mouth.  Feeling dizzy.  Dark pee.  Peeing less than normal.  Feeling confused.  Fast breathing or heart rate. GET HELP RIGHT AWAY IF:   You have blood in your throw up.  You have black or bloody poop (stool).  You have a bad headache or stiff neck.  You feel confused.  You have bad belly (abdominal) pain.  You have chest pain or trouble breathing.  You do not pee at least once every 8 hours.  You have cold, clammy skin.  You keep throwing up after 24 to 48 hours.  You have a fever. MAKE SURE YOU:   Understand these instructions.  Will watch your condition.  Will get help right away if you are not doing well or get worse.   This information is not intended to replace advice given to you by your health care provider. Make sure you discuss any questions you have with your health care provider.   Document Released: 08/23/2007 Document Revised: 05/29/2011 Document Reviewed: 08/05/2010 Elsevier Interactive Patient Education 2016 [employer]  Colitis Colitis is inflammation of the colon. Colitis may last a short time (acute) or it may last a long time (chronic). CAUSES This condition may be caused by:  Viruses.  Bacteria.  Reactions to medicine.  Certain autoimmune diseases, such as Crohn disease or  ulcerative colitis. SYMPTOMS Symptoms of this condition include:  Diarrhea.  Passing bloody or tarry stool.  Pain.  Fever.  Vomiting.  Tiredness (fatigue).  Weight loss.  Bloating.  Sudden increase in abdominal pain.  Having fewer bowel movements than usual. DIAGNOSIS This condition is diagnosed with a stool test or a blood test. You may also have other tests, including X-rays, a CT scan, or a colonoscopy. TREATMENT Treatment may include:  Resting the bowel. This involves not eating or drinking for a period of time.  Fluids that are given through an IV tube.  Medicine for pain and diarrhea.  Antibiotic medicines.  Cortisone medicines.  Surgery. HOME CARE INSTRUCTIONS Eating and Drinking  Follow instructions from your health care provider about eating or drinking restrictions.  Drink enough fluid to keep your urine clear or pale yellow.  Work with a dietitian to determine which foods cause your condition to flare up.  Avoid foods that cause flare-ups.  Eat a well-balanced diet. Medicines  Take over-the-counter and prescription medicines only as told by your health care provider.  If you were prescribed an antibiotic medicine, take it as told by your health care provider. Do not stop taking the antibiotic even if you start to feel better. General Instructions  Keep all follow-up visits as told by your health care provider. This is important. SEEK MEDICAL CARE IF:  Your symptoms do not go away.  You develop new symptoms. SEEK IMMEDIATE MEDICAL CARE IF:  You have a fever that does not go away with treatment.  You develop chills.  You have extreme weakness, fainting, or dehydration.  You have repeated vomiting.  You develop severe pain in your abdomen.  You pass bloody or tarry stool.   This information is not intended to replace advice given to you by your health care provider. Make sure you discuss any questions you have with your health care  provider.   Document Released: 04/13/2004 Document Revised: 11/25/2014 Document Reviewed: 06/29/2014 Elsevier Interactive Patient Education [employer].

## 2015-01-24 NOTE — ED Provider Notes (Signed)
[facility] Emergency Department Provider Note  ____________________________________________  Time seen: Approximately 11:17 PM  I have reviewed the triage vital signs and the nursing notes.   HISTORY  Chief Complaint Abdominal Pain    HPI Todd [name] is a 74 y.o. male who was seen less than 24 hours ago for abdominal pain and nausea/vomiting who returns with persistent abdominal pain and persistent vomiting and inability to tolerate by mouth.  He was worked up in the emergency department and had a CT scan of the abdomen and pelvis which showed enteritis.  He was started on Cipro and Flagyl and discharged when he was feeling better morning.  However, in the afternoon his abdominal pain returns, is described as strong/severe and cramping, generalized throughout his abdomen, and multiple episodes of vomiting occurred throughout the course after noon and evening.  They return to the emergency department as instructed should his symptoms worsen.  He last had a bowel movement yesterday and does not remember passing flatus today.  He states that the discomfort is as bad as it was yesterday but does not feel worse.  The onset has been relatively gradual.  Nothing makes it better and trying to eat makes it worse.   Past Medical History  Diagnosis Date  . PONV (postoperative nausea and vomiting)   . Hyperlipidemia     takes Crestor daily  . Hypertension     takes Amlodipine and Metoprolol  . Coronary artery disease   . 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 Joint pain     knees,elbows,back  . Diabetes mellitus     lantus and novolog;fasting sugars run 70-120    Patient Active Problem List   Diagnosis Date Noted  . Abdominal pain 01/25/2015    Past Surgical History  Procedure Laterality Date  . Knee arthroscopy  80's    right knee  . Cholecystectomy  20+yrs ago  . Cardiac catheterization  01/2006    2 stents placed  . Back surgery  09/15/10  . Cataract  extraction  12/21/10--02/01/11    right and left  . Elbow surgery  2012    right elbow  . Colonoscopy    . Ulnar nerve transposition  03/02/2011    Procedure: ULNAR NERVE DECOMPRESSION/TRANSPOSITION;  Surgeon: Peggyann Shoals, MD;  Location: [place] [facility];  Service: Neurosurgery;  Laterality: Left;  LEFT ulnar nerve decompression    No current outpatient prescriptions on file.  Allergies Valsartan and Vitamin d analogs  Family History  Problem Relation Age of Onset  . Anesthesia problems Neg Hx   . Hypotension Neg Hx   . Malignant hyperthermia Neg Hx   . Pseudochol deficiency Neg Hx     Social History Social History  Substance Use Topics  . Smoking status: Never Smoker   . Smokeless tobacco: None  . Alcohol Use: No    Review of Systems Constitutional: No fever/chills Eyes: No visual changes. ENT: No sore throat. Cardiovascular: Denies chest pain. Respiratory: Denies shortness of breath. Gastrointestinal: Severe cramping abdominal pain with persistent vomiting.  No diarrhea.  No constipation. Genitourinary: Negative for dysuria. Musculoskeletal: Negative for back pain. Skin: Negative for rash. Neurological: Negative for headaches, focal weakness or numbness.  10-point ROS otherwise negative.  ____________________________________________   PHYSICAL EXAM:  VITAL SIGNS: ED Triage Vitals  Enc Vitals Group     BP 01/24/15 2126 180/74 mmHg     Pulse Rate 01/24/15 2126 88     Resp 01/24/15 2126  20     Temp 01/24/15 2126 98.7 F (37.1 C)     Temp Source 01/24/15 2126 Oral     SpO2 01/24/15 2126 96 %     Weight 01/24/15 2126 204 lb (92.534 kg)     Height 01/24/15 2126 5\' 9"  (1.753 m)     Head Cir --      Peak Flow --      Pain Score 01/24/15 2126 6     Pain Loc --      Pain Edu? --      Excl. in [place]? --     Constitutional: Alert and oriented. Well appearing and in no acute distress. Eyes: Conjunctivae are normal. PERRL. EOMI. Head: Atraumatic. Nose: No  congestion/rhinnorhea. Mouth/Throat: Mucous membranes are moist.  Oropharynx non-erythematous. Neck: No stridor.   Cardiovascular: Normal rate, regular rhythm. Grossly normal heart sounds.  Good peripheral circulation. Respiratory: Normal respiratory effort.  No retractions. Lungs CTAB. Gastrointestinal: Soft, nondistended, non-tympanitic.  Hyperactive bowel sounds.  Generalized tenderness to palpation throughout. Musculoskeletal: No lower extremity tenderness nor edema.  No joint effusions. Neurologic:  Normal speech and language. No gross focal neurologic deficits are appreciated.  Skin:  Skin is warm, dry and intact. No rash noted. Psychiatric: Mood and affect are normal. Speech and behavior are normal.  ____________________________________________   LABS (all labs ordered are listed, but only abnormal results are displayed)  Labs Reviewed  COMPREHENSIVE METABOLIC PANEL - Abnormal; Notable for the following:    Chloride 116 (*)    Glucose, Bld 139 (*)    BUN 50 (*)    Creatinine, Ser 3.74 (*)    Calcium 8.1 (*)    Total Protein 6.4 (*)    Albumin 3.3 (*)    AST 314 (*)    ALT 238 (*)    GFR calc non Af Amer 15 (*)    GFR calc Af Amer 17 (*)    Anion gap 0 (*)    All other components within normal limits  CBC WITH DIFFERENTIAL/PLATELET - Abnormal; Notable for the following:    RBC 3.24 (*)    Hemoglobin 10.1 (*)    HCT 30.6 (*)    Lymphs Abs 0.5 (*)    All other components within normal limits  TSH - Abnormal; Notable for the following:    TSH 6.162 (*)    All other components within normal limits  HEMOGLOBIN A1C - Abnormal; Notable for the following:    Hgb A1c MFr Bld 6.1 (*)    All other components within normal limits  LIPASE, BLOOD  GAMMA GT  BILIRUBIN, DIRECT  PROTIME-INR  T4   ____________________________________________  EKG  unavailable ____________________________________________  RADIOLOGY  (CT FROM PRIOR VISIT) Dg Abd Acute W/chest  01/25/2015   CLINICAL DATA:  Abdominal pain, diarrhea and vomiting for 2-3 days. Assess for small bowel obstruction or ileus. EXAM: DG ABDOMEN ACUTE W/ 1V CHEST COMPARISON:  CT abdomen pelvis January 24, 2015 at 7:49 a.m. FINDINGS: Cardiac silhouette appears upper limits of normal in size, mediastinal silhouette is nonsuspicious, mildly calcified aortic knob. Elevated RIGHT hemidiaphragm. Lungs are clear, no pleural effusions. No pneumothorax. Soft tissue planes and included osseous structures are nonacute ; low LEFT posterior rib fractures. Contrast distended small bowel with air-fluid levels at a similar level, small bowel measures up to 3.5 cm by plain radiography. Paucity of large bowel gas. Surgical clips in the included right abdomen compatible with cholecystectomy. No intraperitoneal free air. Seminal vesicle calcifications most often  associated with diabetes. 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 IMPRESSION: No acute cardiopulmonary process. Findings suspicious for small bowel obstruction, less likely ileus. Electronically Signed   By: Elon Alas M.D.   On: 01/25/2015 00:05    ____________________________________________   PROCEDURES  Procedure(s) performed: None  Critical Care performed: No ____________________________________________   INITIAL IMPRESSION / ASSESSMENT AND PLAN / ED COURSE  Pertinent labs & imaging results that were available during my care of the patient were reviewed by me and considered in my medical decision making (see chart for details).  The patient has failed outpatient management of his enteritis.  He returns with worsening abdominal pain and persistent vomiting and inability to tolerate by mouth.  He has severe chronic kidney disease appears stable.  I will admit him for pain control and intractable vomiting in the setting of this enteritis.   (Note that documentation was delayed due to multiple ED patients requiring immediate  care.)   The acute abdomen series was significant for possible SBO.  Dr. Marcille Blanco contacted Dr. Marina Gravel (surgery) and a surgeon will consult on the patient in the morning.  ____________________________________________  FINAL CLINICAL IMPRESSION(S) / ED DIAGNOSES  Final diagnoses:  Generalized abdominal pain  Intractable vomiting with nausea, vomiting of unspecified type  Enteritis  Acute hepatitis      NEW MEDICATIONS STARTED DURING THIS VISIT:  Current Discharge Medication List       Hinda Kehr, MD 01/28/15 1358

## 2015-01-25 ENCOUNTER — Inpatient Hospital Stay: Payer: Medicare Other

## 2015-01-25 DIAGNOSIS — Z888 Allergy status to other drugs, medicaments and biological substances status: Secondary | ICD-10-CM | POA: Diagnosis not present

## 2015-01-25 DIAGNOSIS — M549 Dorsalgia, unspecified: Secondary | ICD-10-CM | POA: Diagnosis present

## 2015-01-25 DIAGNOSIS — K7200 Acute and subacute hepatic failure without coma: Secondary | ICD-10-CM | POA: Diagnosis not present

## 2015-01-25 DIAGNOSIS — E039 Hypothyroidism, unspecified: Secondary | ICD-10-CM | POA: Diagnosis present

## 2015-01-25 DIAGNOSIS — K529 Noninfective gastroenteritis and colitis, unspecified: Secondary | ICD-10-CM | POA: Diagnosis present

## 2015-01-25 DIAGNOSIS — M25569 Pain in unspecified knee: Secondary | ICD-10-CM | POA: Diagnosis present

## 2015-01-25 DIAGNOSIS — E119 Type 2 diabetes mellitus without complications: Secondary | ICD-10-CM | POA: Diagnosis present

## 2015-01-25 DIAGNOSIS — K567 Ileus, unspecified: Secondary | ICD-10-CM | POA: Insufficient documentation

## 2015-01-25 DIAGNOSIS — E785 Hyperlipidemia, unspecified: Secondary | ICD-10-CM | POA: Diagnosis present

## 2015-01-25 DIAGNOSIS — R1084 Generalized abdominal pain: Secondary | ICD-10-CM | POA: Diagnosis not present

## 2015-01-25 DIAGNOSIS — R1110 Vomiting, unspecified: Secondary | ICD-10-CM | POA: Diagnosis not present

## 2015-01-25 DIAGNOSIS — I2510 Atherosclerotic heart disease of native coronary artery without angina pectoris: Secondary | ICD-10-CM | POA: Diagnosis present

## 2015-01-25 DIAGNOSIS — B179 Acute viral hepatitis, unspecified: Secondary | ICD-10-CM | POA: Diagnosis present

## 2015-01-25 DIAGNOSIS — I129 Hypertensive chronic kidney disease with stage 1 through stage 4 chronic kidney disease, or unspecified chronic kidney disease: Secondary | ICD-10-CM | POA: Diagnosis present

## 2015-01-25 DIAGNOSIS — Z79891 Long term (current) use of opiate analgesic: Secondary | ICD-10-CM | POA: Diagnosis not present

## 2015-01-25 DIAGNOSIS — Z7982 Long term (current) use of aspirin: Secondary | ICD-10-CM | POA: Diagnosis not present

## 2015-01-25 DIAGNOSIS — N184 Chronic kidney disease, stage 4 (severe): Secondary | ICD-10-CM | POA: Diagnosis present

## 2015-01-25 DIAGNOSIS — Z9049 Acquired absence of other specified parts of digestive tract: Secondary | ICD-10-CM | POA: Diagnosis not present

## 2015-01-25 DIAGNOSIS — Z794 Long term (current) use of insulin: Secondary | ICD-10-CM | POA: Diagnosis not present

## 2015-01-25 DIAGNOSIS — Z7984 Long term (current) use of oral hypoglycemic drugs: Secondary | ICD-10-CM | POA: Diagnosis not present

## 2015-01-25 DIAGNOSIS — Z955 Presence of coronary angioplasty implant and graft: Secondary | ICD-10-CM | POA: Diagnosis not present

## 2015-01-25 DIAGNOSIS — M25529 Pain in unspecified elbow: Secondary | ICD-10-CM | POA: Diagnosis present

## 2015-01-25 DIAGNOSIS — Z79899 Other long term (current) drug therapy: Secondary | ICD-10-CM | POA: Diagnosis not present

## 2015-01-25 DIAGNOSIS — R109 Unspecified abdominal pain: Secondary | ICD-10-CM | POA: Diagnosis present

## 2015-01-25 LAB — COMPREHENSIVE METABOLIC PANEL
ALT: 238 U/L — ABNORMAL HIGH (ref 17–63)
ANION GAP: 0 — AB (ref 5–15)
AST: 314 U/L — ABNORMAL HIGH (ref 15–41)
Albumin: 3.3 g/dL — ABNORMAL LOW (ref 3.5–5.0)
Alkaline Phosphatase: 94 U/L (ref 38–126)
BUN: 50 mg/dL — ABNORMAL HIGH (ref 6–20)
CALCIUM: 8.1 mg/dL — AB (ref 8.9–10.3)
CHLORIDE: 116 mmol/L — AB (ref 101–111)
CO2: 24 mmol/L (ref 22–32)
Creatinine, Ser: 3.74 mg/dL — ABNORMAL HIGH (ref 0.61–1.24)
GFR calc non Af Amer: 15 mL/min — ABNORMAL LOW (ref >60)
GFR, EST AFRICAN AMERICAN: 17 mL/min — AB (ref >60)
Glucose, Bld: 139 mg/dL — ABNORMAL HIGH (ref 65–99)
POTASSIUM: 4.8 mmol/L (ref 3.5–5.1)
SODIUM: 140 mmol/L (ref 135–145)
Total Bilirubin: 0.8 mg/dL (ref 0.3–1.2)
Total Protein: 6.4 g/dL — ABNORMAL LOW (ref 6.5–8.1)

## 2015-01-25 LAB — HEMOGLOBIN A1C: Hgb A1c MFr Bld: 6.1 % — ABNORMAL HIGH (ref 4.0–6.0)

## 2015-01-25 LAB — GLUCOSE, CAPILLARY
Glucose-Capillary: 163 mg/dL — ABNORMAL HIGH (ref 65–99)
Glucose-Capillary: 121 mg/dL — ABNORMAL HIGH (ref 65–99)
Glucose-Capillary: 149 mg/dL — ABNORMAL HIGH (ref 65–99)
Glucose-Capillary: 169 mg/dL — ABNORMAL HIGH (ref 65–99)

## 2015-01-25 LAB — BILIRUBIN, DIRECT: Bilirubin, Direct: 0.1 mg/dL (ref 0.1–0.5)

## 2015-01-25 LAB — LIPASE, BLOOD: LIPASE: 28 U/L (ref 11–51)

## 2015-01-25 LAB — PROTIME-INR
INR: 1.15
Prothrombin Time: 14.9 seconds (ref 11.4–15.0)

## 2015-01-25 LAB — GAMMA GT: GGT: 34 U/L (ref 7–50)

## 2015-01-25 LAB — TSH: TSH: 6.162 u[IU]/mL — ABNORMAL HIGH (ref 0.350–4.500)

## 2015-01-25 MED ORDER — CARVEDILOL 6.25 MG PO TABS
6.2500 mg | ORAL_TABLET | Freq: Two times a day (BID) | ORAL | Status: DC
  Administered 2015-01-25 – 2015-01-27 (×5): 6.25 mg via ORAL
  Filled 2015-01-25 (×6): qty 1

## 2015-01-25 MED ORDER — PIPERACILLIN-TAZOBACTAM 3.375 G IVPB
3.3750 g | Freq: Two times a day (BID) | INTRAVENOUS | Status: DC
  Administered 2015-01-25 – 2015-01-26 (×4): 3.375 g via INTRAVENOUS
  Filled 2015-01-25 (×5): qty 50

## 2015-01-25 MED ORDER — ASPIRIN EC 81 MG PO TBEC
81.0000 mg | DELAYED_RELEASE_TABLET | Freq: Every day | ORAL | Status: DC
Start: 2015-01-25 — End: 2015-01-27
  Administered 2015-01-25 – 2015-01-27 (×3): 81 mg via ORAL
  Filled 2015-01-25 (×3): qty 1

## 2015-01-25 MED ORDER — ACETAMINOPHEN 325 MG PO TABS: 650.0000 mg | ORAL_TABLET | Freq: Four times a day (QID) | ORAL | Status: DC | PRN

## 2015-01-25 MED ORDER — ROSUVASTATIN CALCIUM 10 MG PO TABS
20.0000 mg | ORAL_TABLET | Freq: Every day | ORAL | Status: DC
  Administered 2015-01-25 – 2015-01-27 (×3): 20 mg via ORAL
  Filled 2015-01-25 (×3): qty 2

## 2015-01-25 MED ORDER — VITAMIN D 1000 UNITS PO TABS
1000.0000 [IU] | ORAL_TABLET | Freq: Every day | ORAL | Status: DC
  Administered 2015-01-25 – 2015-01-27 (×3): 1000 [IU] via ORAL
  Filled 2015-01-25 (×3): qty 1

## 2015-01-25 MED ORDER — ONDANSETRON HCL 4 MG PO TABS: 4.0000 mg | ORAL_TABLET | Freq: Four times a day (QID) | ORAL | Status: DC | PRN

## 2015-01-25 MED ORDER — MORPHINE SULFATE (PF) 2 MG/ML IV SOLN: 1.0000 mg | INTRAVENOUS | Status: DC | PRN

## 2015-01-25 MED ORDER — MORPHINE SULFATE (PF) 2 MG/ML IV SOLN
2.0000 mg | Freq: Once | INTRAVENOUS | Status: AC
  Administered 2015-01-25: 2 mg via INTRAVENOUS
  Filled 2015-01-25: qty 1

## 2015-01-25 MED ORDER — SODIUM POLYSTYRENE SULFONATE 15 GM/60ML PO SUSP
15.0000 g | Freq: Once | ORAL | Status: AC
  Administered 2015-01-25: 15 g via ORAL
  Filled 2015-01-25: qty 60

## 2015-01-25 MED ORDER — ACETAMINOPHEN 650 MG RE SUPP: 650.0000 mg | Freq: Four times a day (QID) | RECTAL | Status: DC | PRN

## 2015-01-25 MED ORDER — INSULIN GLARGINE 100 UNIT/ML ~~LOC~~ SOLN
6.0000 [IU] | Freq: Every day | SUBCUTANEOUS | Status: DC
  Administered 2015-01-25: 6 [IU] via SUBCUTANEOUS
  Filled 2015-01-25 (×2): qty 0.06

## 2015-01-25 MED ORDER — SODIUM CHLORIDE 0.9 % IV SOLN
INTRAVENOUS | Status: DC
  Administered 2015-01-25 – 2015-01-26 (×4): via INTRAVENOUS

## 2015-01-25 MED ORDER — HEPARIN SODIUM (PORCINE) 5000 UNIT/ML IJ SOLN
5000.0000 [IU] | Freq: Three times a day (TID) | INTRAMUSCULAR | Status: DC
Start: 2015-01-25 — End: 2015-01-27
  Administered 2015-01-25 – 2015-01-27 (×7): 5000 [IU] via SUBCUTANEOUS
  Filled 2015-01-25 (×7): qty 1

## 2015-01-25 MED ORDER — METRONIDAZOLE 500 MG PO TABS: 500.0000 mg | ORAL_TABLET | Freq: Two times a day (BID) | ORAL | Status: DC

## 2015-01-25 MED ORDER — LORATADINE 10 MG PO TABS
10.0000 mg | ORAL_TABLET | Freq: Every day | ORAL | Status: DC
  Administered 2015-01-25 – 2015-01-27 (×3): 10 mg via ORAL
  Filled 2015-01-25 (×3): qty 1

## 2015-01-25 MED ORDER — SODIUM BICARBONATE 650 MG PO TABS
1300.0000 mg | ORAL_TABLET | Freq: Three times a day (TID) | ORAL | Status: DC
  Administered 2015-01-25 – 2015-01-27 (×7): 1300 mg via ORAL
  Filled 2015-01-25 (×7): qty 2

## 2015-01-25 MED ORDER — INSULIN ASPART 100 UNIT/ML ~~LOC~~ SOLN
0.0000 [IU] | Freq: Three times a day (TID) | SUBCUTANEOUS | Status: DC
  Administered 2015-01-25: 1 [IU] via SUBCUTANEOUS
  Administered 2015-01-25: 2 [IU] via SUBCUTANEOUS
  Administered 2015-01-25 – 2015-01-26 (×3): 1 [IU] via SUBCUTANEOUS
  Administered 2015-01-26: 2 [IU] via SUBCUTANEOUS
  Administered 2015-01-27: 1 [IU] via SUBCUTANEOUS
  Administered 2015-01-27: 2 [IU] via SUBCUTANEOUS
  Filled 2015-01-25 (×3): qty 1
  Filled 2015-01-25: qty 2
  Filled 2015-01-25: qty 1
  Filled 2015-01-25 (×2): qty 2
  Filled 2015-01-25: qty 1

## 2015-01-25 MED ORDER — CIPROFLOXACIN HCL 500 MG PO TABS: 500.0000 mg | ORAL_TABLET | Freq: Two times a day (BID) | ORAL | Status: DC

## 2015-01-25 MED ORDER — ONDANSETRON HCL 4 MG/2ML IJ SOLN: 4.0000 mg | Freq: Four times a day (QID) | INTRAMUSCULAR | Status: DC | PRN

## 2015-01-25 MED ORDER — AMLODIPINE BESYLATE 10 MG PO TABS
10.0000 mg | ORAL_TABLET | Freq: Every day | ORAL | Status: DC
  Administered 2015-01-25 – 2015-01-27 (×3): 10 mg via ORAL
  Filled 2015-01-25 (×3): qty 1

## 2015-01-25 MED ORDER — TRAMADOL HCL 50 MG PO TABS: 50.0000 mg | ORAL_TABLET | Freq: Four times a day (QID) | ORAL | Status: DC | PRN

## 2015-01-25 NOTE — Consult Note (Signed)
CC: Emesis x 2 days  HPI: Todd [name] is a pleasant 74 yo M with a history of CAD with stents and prior cholecystectomy who presents with nausea/vomiting.  Began suddenly 2 days ago. Presented in ER then and found some fluid around his RLQ which was presumed to be due to enteritis.  Returns with persistent nausea/vomiting.  Initially had some diarrhea/now with normal BM yesterday.  ? Flatus.  No distention.  No nausea/vomiting since admission.  No abdominal pain, no appetite.  No fevers/chills, night sweats, shortness of breath, cough, chest pain, dysuria/hematuria.  Active Ambulatory Problems    Diagnosis Date Noted  . No Active Ambulatory Problems   Resolved Ambulatory Problems    Diagnosis Date Noted  . No Resolved Ambulatory Problems   Past Medical History  Diagnosis Date  . PONV (postoperative nausea and vomiting)   . Hyperlipidemia   . Hypertension   . Coronary artery disease   . 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 Joint pain   . Diabetes mellitus    Past Surgical History  Procedure Laterality Date  . Knee arthroscopy  80's    right knee  . Cholecystectomy  20+yrs ago  . Cardiac catheterization  01/2006    2 stents placed  . Back surgery  09/15/10  . Cataract extraction  12/21/10--02/01/11    right and left  . Elbow surgery  2012    right elbow  . Colonoscopy    . Ulnar nerve transposition  03/02/2011    Procedure: ULNAR NERVE DECOMPRESSION/TRANSPOSITION;  Surgeon: Peggyann Shoals, MD;  Location: [facility];  Service: Neurosurgery;  Laterality: Left;  LEFT ulnar nerve decompression     Medication List    ASK your doctor about these medications        amLODipine 5 MG tablet  Commonly known as:  NORVASC  Take 10 mg by mouth daily after breakfast.     aspirin EC 81 MG tablet  Take 81 mg by mouth daily.     carvedilol 6.25 MG tablet  Commonly known as:  COREG  Take 6.25 mg by mouth 2 (two) times daily with a meal.     cholecalciferol 1000 UNITS tablet  Commonly known as:   VITAMIN D  Take 1,000 Units by mouth daily.     ciprofloxacin 500 MG tablet  Commonly known as:  CIPRO  Take 1 tablet (500 mg total) by mouth 2 (two) times daily.     insulin glargine 100 UNIT/ML injection  Commonly known as:  LANTUS  Inject 11 Units into the skin at bedtime.     insulin lispro 100 UNIT/ML injection  Commonly known as:  HUMALOG  Inject 6 Units into the skin daily with supper.     loratadine 10 MG tablet  Commonly known as:  CLARITIN  Take 10 mg by mouth daily.     metroNIDAZOLE 500 MG tablet  Commonly known as:  FLAGYL  Take 1 tablet (500 mg total) by mouth 2 (two) times daily.     oxyCODONE 5 MG immediate release tablet  Commonly known as:  Oxy IR/ROXICODONE  Take 5-10 mg by mouth every 4 (four) hours as needed for severe pain.     rosuvastatin 10 MG tablet  Commonly known as:  CRESTOR  Take 20 mg by mouth daily.     sodium bicarbonate 650 MG tablet  Take 1,300 mg by mouth 3 (three) times daily.     sodium polystyrene 15 GM/60ML suspension  Commonly known as:  KAYEXALATE  Take 15 g by mouth once.     traMADol 50 MG tablet  Commonly known as:  ULTRAM  Take 50 mg by mouth every 6 (six) hours as needed for moderate pain.       Allergies  Allergen Reactions  . Valsartan Other (See Comments)    Unsure of symptoms, was on list from previous hospital and patient was unsure of name of medicine.  . Vitamin D Analogs Other (See Comments)    Bloating and swelling in abdomen region, indigestion. Prescription vit d not otc   Social History   Social History  . Marital Status: Married    Spouse Name: N/A  . Number of Children: N/A  . Years of Education: N/A   Occupational History  . Not on file.   Social History Main Topics  . Smoking status: Never Smoker   . Smokeless tobacco: Not on file  . Alcohol Use: No  . Drug Use: No  . Sexual Activity: Yes   Other Topics Concern  . Not on file   Social History Narrative   Family History  Problem  Relation Age of Onset  . Anesthesia problems Neg Hx   . Hypotension Neg Hx   . Malignant hyperthermia Neg Hx   . Pseudochol deficiency Neg Hx    ROS: Full ROS obtained, pertinent positives and negatives as above  Blood pressure 157/71, pulse 81, temperature 98 F (36.7 C), temperature source Oral, resp. rate 14, height 5' 9.5" (1.765 m), weight 196 lb 12.8 oz (89.268 kg), SpO2 98 %. GEN: NAD/A&Ox3 FACE: no obvious facial trauma, normal external nose, normal external ears EYES: no scleral icterus, no conjunctivitis HEAD: normocephalic atraumatic CV: RRR, no MRG RESP: moving air well, lungs clear ABD: soft, nontender, nondistended EXT: moving all ext well, strength 5/5, right leg mildly swollen, right foot without erythema, swollen, tender to palpation, + pitting edema NEURO: cnII-XII grossly intact, sensation intact all 4 ext  Labs: Personally reviewed, significant for WBC 7.1 AST 314 ALT 238 (up from 24/18 prior) Bili 0.8  Imaging:Personally reviewed CT: small amount of RLQ fluid KUB: Mildly dilated sb loops, contrast in small bowel, paucity of contrast in colon  A/P 74 yo M with nausea/vomiting, dilated sb on KUB without contrast progression into colon.  No nausea at this time.  Although he is at risk of SBO from prior surgery, I feel that this is more likely consistent with gastroenteritis vs ileus.  No need for NG tube at this time.  Unable to assess etiology of elevated LFT but may be ileus due to ? Hepatitis.  If remains elevated will obtain GI consult.  In addition, will obtain KUB in am to eval for contrast progression.

## 2015-01-25 NOTE — H&P (Addendum)
Todd [name] is an 74 y.o. male.   Chief Complaint: Abdominal pain HPI: The patient presents emergency department for the second time in as many days complaining of abdominal pain. He was diagnosed with colitis yesterday and started on Cipro and Flagyl. Today the patient had multiple episodes of nonbloody emesis. He states that his last episode of vomiting was green in color. He denies fever, chest pain or shortness of breath. He has had diarrhea since the onset of this gastrointestinal illness. In the emergency department and acute abdominal series was obtained which showed possible small bowel obstruction. Due to this new finding and his inability to eat by mouth the emergency department staff called for admission.  Past Medical History  Diagnosis Date  . PONV (postoperative nausea and vomiting)   . Hyperlipidemia     takes Crestor daily  . Hypertension     takes Amlodipine and Metoprolol  . Coronary artery disease   . 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 Joint pain     knees,elbows,back  . Diabetes mellitus     lantus and novolog;fasting sugars run 70-120    Past Surgical History  Procedure Laterality Date  . Knee arthroscopy  80's    right knee  . Cholecystectomy  20+yrs ago  . Cardiac catheterization  01/2006    2 stents placed  . Back surgery  09/15/10  . Cataract extraction  12/21/10--02/01/11    right and left  . Elbow surgery  2012    right elbow  . Colonoscopy    . Ulnar nerve transposition  03/02/2011    Procedure: ULNAR NERVE DECOMPRESSION/TRANSPOSITION;  Surgeon: Peggyann Shoals, MD;  Location: [facility];  Service: Neurosurgery;  Laterality: Left;  LEFT ulnar nerve decompression    Family History  Problem Relation Age of Onset  . Anesthesia problems Neg Hx   . Hypotension Neg Hx   . Malignant hyperthermia Neg Hx   . Pseudochol deficiency Neg Hx    Social History:  reports that he has never smoked. He does not have any smokeless tobacco history on file. He  reports that he does not drink alcohol or use illicit drugs.  Allergies:  Allergies  Allergen Reactions  . Valsartan Other (See Comments)    Unsure of symptoms, was on list from previous hospital and patient was unsure of name of medicine.  . Vitamin D Analogs Other (See Comments)    Bloating and swelling in abdomen region, indigestion. Prescription vit d not otc    Prior to Admission medications   Medication Sig Start Date End Date Taking? Authorizing Provider  amLODipine (NORVASC) 5 MG tablet Take 10 mg by mouth daily after breakfast.    Yes Historical Provider, MD  aspirin EC 81 MG tablet Take 81 mg by mouth daily.     Yes Historical Provider, MD  carvedilol (COREG) 6.25 MG tablet Take 6.25 mg by mouth 2 (two) times daily with a meal.   Yes Historical Provider, MD  cholecalciferol (VITAMIN D) 1000 UNITS tablet Take 1,000 Units by mouth daily.     Yes Historical Provider, MD  ciprofloxacin (CIPRO) 500 MG tablet Take 1 tablet (500 mg total) by mouth 2 (two) times daily. 01/24/15 01/27/15 Yes Loney Hering, MD  insulin glargine (LANTUS) 100 UNIT/ML injection Inject 11 Units into the skin at bedtime.    Yes Historical Provider, MD  insulin lispro (HUMALOG) 100 UNIT/ML injection Inject 6 Units into the skin daily with supper.  Yes Historical Provider, MD  loratadine (CLARITIN) 10 MG tablet Take 10 mg by mouth daily.   Yes Historical Provider, MD  metroNIDAZOLE (FLAGYL) 500 MG tablet Take 1 tablet (500 mg total) by mouth 2 (two) times daily. 01/24/15 02/07/15 Yes Loney Hering, MD  oxyCODONE (OXY IR/ROXICODONE) 5 MG immediate release tablet Take 5-10 mg by mouth every 4 (four) hours as needed for severe pain.   Yes Historical Provider, MD  rosuvastatin (CRESTOR) 10 MG tablet Take 20 mg by mouth daily.    Yes Historical Provider, MD  sodium bicarbonate 650 MG tablet Take 1,300 mg by mouth 3 (three) times daily.     Yes Historical Provider, MD  sodium polystyrene (KAYEXALATE) 15 GM/60ML  suspension Take 15 g by mouth once.   Yes Historical Provider, MD  traMADol (ULTRAM) 50 MG tablet Take 50 mg by mouth every 6 (six) hours as needed for moderate pain.   Yes Historical Provider, MD     Results for orders placed or performed during the hospital encounter of 01/24/15 (from the past 48 hour(s))  Lipase, blood     Status: None   Collection Time: 01/24/15 10:04 PM  Result Value Ref Range   Lipase 28 11 - 51 U/L  Comprehensive metabolic panel     Status: Abnormal   Collection Time: 01/24/15 10:04 PM  Result Value Ref Range   Sodium 140 135 - 145 mmol/L   Potassium 4.8 3.5 - 5.1 mmol/L   Chloride 116 (H) 101 - 111 mmol/L   CO2 24 22 - 32 mmol/L   Glucose, Bld 139 (H) 65 - 99 mg/dL   BUN 50 (H) 6 - 20 mg/dL   Creatinine, Ser 3.74 (H) 0.61 - 1.24 mg/dL   Calcium 8.1 (L) 8.9 - 10.3 mg/dL   Total Protein 6.4 (L) 6.5 - 8.1 g/dL   Albumin 3.3 (L) 3.5 - 5.0 g/dL   AST 314 (H) 15 - 41 U/L   ALT 238 (H) 17 - 63 U/L   Alkaline Phosphatase 94 38 - 126 U/L   Total Bilirubin 0.8 0.3 - 1.2 mg/dL   GFR calc non Af Amer 15 (L) >60 mL/min   GFR calc Af Amer 17 (L) >60 mL/min    Comment: (NOTE) The eGFR has been calculated using the CKD EPI equation. This calculation has not been validated in all clinical situations. eGFR's persistently <60 mL/min signify possible Chronic Kidney Disease.    Anion gap 0 (L) 5 - 15  CBC WITH DIFFERENTIAL     Status: Abnormal   Collection Time: 01/24/15 10:04 PM  Result Value Ref Range   WBC 7.1 3.8 - 10.6 K/uL   RBC 3.24 (L) 4.40 - 5.90 MIL/uL   Hemoglobin 10.1 (L) 13.0 - 18.0 g/dL   HCT 30.6 (L) 40.0 - 52.0 %   MCV 94.4 80.0 - 100.0 fL   MCH 31.3 26.0 - 34.0 pg   MCHC 33.1 32.0 - 36.0 g/dL   RDW 12.8 11.5 - 14.5 %   Platelets 191 150 - 440 K/uL   Neutrophils Relative % 81 %   Neutro Abs 5.8 1.4 - 6.5 K/uL   Lymphocytes Relative 8 %   Lymphs Abs 0.5 (L) 1.0 - 3.6 K/uL   Monocytes Relative 9 %   Monocytes Absolute 0.6 0.2 - 1.0 K/uL    Eosinophils Relative 1 %   Eosinophils Absolute 0.0 0 - 0.7 K/uL   Basophils Relative 1 %   Basophils Absolute 0.0 0 -  0.1 K/uL   Ct Abdomen Pelvis Wo Contrast  01/24/2015  CLINICAL DATA:  74 year old male with complaint of mid abdominal pain since 11 p.m. yesterday evening. Pain is located in the central abdomen. EXAM: CT ABDOMEN AND PELVIS WITHOUT CONTRAST TECHNIQUE: Multidetector CT imaging of the abdomen and pelvis was performed following the standard protocol without IV contrast. COMPARISON:  No priors. FINDINGS: Lower chest: 7 mm nodule in the medial left lower lobe (image 5 of series 4). Probable scarring and chronic volume loss in the right lower lobe. Atherosclerotic calcifications in the left main, left anterior descending, left circumflex and right coronary arteries. Hepatobiliary: No definite cystic or solid hepatic lesions are identified on today's noncontrast CT examination. Status post cholecystectomy. Pancreas: No pancreatic mass or peripancreatic inflammatory changes identified on today's noncontrast CT examination. Spleen: Unremarkable. Adrenals/Urinary Tract: Bilateral adrenal glands are normal in appearance. Bilateral kidneys are normal in appearance. No urinary tract calculi. No hydroureteronephrosis. Unenhanced appearance of the visualized portions of the urinary bladder (partially obscured by beam hardening artifact from the patient's right hip prosthesis) is unremarkable. Stomach/Bowel: Unenhanced appearance of the stomach is normal. No pathologic dilatation of small bowel or colon. There are some loops of distal ileum in the right lower quadrant which have a small amount of free fluid adjacent to them laterally (image 66 of series 2, and coronal image 57 of series 5). Normal appendix. Vascular/Lymphatic: Atherosclerosis throughout the abdominal and pelvic vasculature, without evidence of aneurysm. No lymphadenopathy noted in the abdomen or pelvis on today's noncontrast CT examination.  Reproductive: Prostate gland and seminal vesicles are incompletely visualized secondary to beam hardening artifact, but appear generally unremarkable. Other: No significant volume of ascites. No pneumoperitoneum. Small ventral hernia in the epigastric region containing only omental fat. 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 There are no aggressive appearing lytic or blastic lesions noted in the visualized portions of the skeleton. IMPRESSION: 1. Small volume of fluid adjacent to some loops of bowel in the right side of the abdomen (distal ileum). This is nonspecific, but could indicate a focus of enteritis, with some reactive free fluid adjacent to inflamed loops of small bowel. 2. Small ventral hernia in the epigastric region containing only omental fat. No associated bowel incarceration or obstruction at this time. 3. 7 mm left lower lobe pulmonary nodule. If the patient is at high risk for bronchogenic carcinoma, follow-up chest CT at 3-49month is recommended. If the patient is at low risk for bronchogenic carcinoma, follow-up chest CT at 6-12 months is recommended. This recommendation follows the consensus statement: Guidelines for Management of Small Pulmonary Nodules Detected on CT Scans: A Statement from the Fleischner Society as published in Radiology 2005; 237:395-400. 4. Atherosclerosis, including left main and 3 vessel coronary artery disease. Please note that although the presence of coronary artery calcium documents the presence of coronary artery disease, the severity of this disease and any potential stenosis cannot be assessed on this non-gated CT examination. Assessment for potential risk factor modification, dietary therapy or pharmacologic therapy may be warranted, if clinically indicated. 5. Additional incidental findings, as above. Electronically Signed   By: DVinnie LangtonM.D.   On: 01/24/2015 07:30   Dg  Abd Acute W/chest  01/25/2015  CLINICAL DATA:  Abdominal pain, diarrhea and vomiting for 2-3 days. Assess for small bowel obstruction or ileus. EXAM: DG ABDOMEN ACUTE W/ 1V CHEST COMPARISON:  CT abdomen pelvis January 24, 2015 at 7:49 a.m.  FINDINGS: Cardiac silhouette appears upper limits of normal in size, mediastinal silhouette is nonsuspicious, mildly calcified aortic knob. Elevated RIGHT hemidiaphragm. Lungs are clear, no pleural effusions. No pneumothorax. Soft tissue planes and included osseous structures are nonacute ; low LEFT posterior rib fractures. Contrast distended small bowel with air-fluid levels at a similar level, small bowel measures up to 3.5 cm by plain radiography. Paucity of large bowel gas. Surgical clips in the included right abdomen compatible with cholecystectomy. No intraperitoneal free air. Seminal vesicle calcifications most often associated with diabetes. 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 IMPRESSION: No acute cardiopulmonary process. Findings suspicious for small bowel obstruction, less likely ileus. Electronically Signed   By: Elon Alas M.D.   On: 01/25/2015 00:05    Review of Systems  Constitutional: Negative for fever and chills.  HENT: Negative for sore throat and tinnitus.   Eyes: Negative for blurred vision and redness.  Respiratory: Negative for cough and shortness of breath.   Cardiovascular: Negative for chest pain, palpitations, orthopnea and PND.  Gastrointestinal: Positive for nausea, vomiting and abdominal pain. Negative for diarrhea.  Genitourinary: Negative for dysuria, urgency and frequency.  Musculoskeletal: Negative for myalgias and joint pain.  Skin: Negative for rash.       No lesions  Neurological: Negative for speech change, focal weakness and weakness.  Endo/Heme/Allergies: Does not bruise/bleed easily.       No temperature intolerance  Psychiatric/Behavioral: Negative for depression and suicidal  ideas.    Blood pressure 156/78, pulse 80, temperature 98.7 F (37.1 C), temperature source Oral, resp. rate 14, height _0  (1.753 m), weight 92.534 kg (204 lb), SpO2 95 %. Physical Exam  Nursing note and vitals reviewed. Constitutional: He is oriented to person, place, and time. He appears well-developed and well-nourished. No distress.  HENT:  Head: Normocephalic and atraumatic.  Mouth/Throat: Oropharynx is clear and moist.  Eyes: Conjunctivae and EOM are normal. Pupils are equal, round, and reactive to light. No scleral icterus.  Neck: Normal range of motion. Neck supple. No JVD present. No tracheal deviation present. No thyromegaly present.  Cardiovascular: Normal rate and regular rhythm.  Exam reveals no gallop and no friction rub.   No murmur heard. Respiratory: Effort normal and breath sounds normal.  GI: Soft. Bowel sounds are normal. He exhibits no distension and no mass. There is tenderness. There is no rebound and no guarding.  Genitourinary:  Deferred  Musculoskeletal: Normal range of motion. He exhibits edema (trace).  Lymphadenopathy:    He has no cervical adenopathy.  Neurological: He is alert and oriented to person, place, and time. He has normal reflexes. No cranial nerve deficit.  Skin: Skin is warm and dry. No rash noted. No erythema.  Bronzing of skin  Psychiatric: He has a normal mood and affect. His behavior is normal. Judgment and thought content normal.     Assessment/Plan This is a 74 year old Caucasian male admitted for small bowel obstruction and acute hepatitis. 1. Small bowel obstruction: Bilious emesis as well as radiographically evidence of small bowel obstruction. The patient's abdomen is not particularly tympanic. His vomiting has stopped. If further vomiting occurs we will place an ND tube for decompression of the bowel. Surgical consult ordered for the morning. Discontinue oral antibiotics. Patient started on Zosyn 2. Acute hepatitis: Significant  transaminitis new since laboratory evaluation yesterday. Rule out infectious causes. I have also obtained bilirubin and gamma glutamyl transferase level. We'll obtain right upper quadrant ultrasound to rule out gallbladder obstruction.  Etiology unclear at this time 3. Hypertension: Continue atenolol 4. Diabetes mellitus type 2: Continue basal insulin. Sliding scale insulin while hospitalized 5. Hypothyroidism: TSH found to be elevated; check free T4 prior to initiation of thyroid replacement as acute hepatitis may have thrown off albumin affecting free thyroxine levels 6. DVT prophylaxis: Heparin 7. GI prophylaxis: None The patient is a full code. Time spent on admission orders and patient care approximate 45 minutes  Harrie Foreman 01/25/2015, 1:53 AM

## 2015-01-25 NOTE — Care Management Important Message (Signed)
Important Message  Patient Details  Name: Todd [name] MRN: [id number] Date of Birth: 1940/07/29   Medicare Important Message Given:  Yes-second notification given    Jolly Mango, RN 01/25/2015, 12:52 PM

## 2015-01-25 NOTE — Progress Notes (Signed)
[facility] [place] [place] NAME: Todd [name]    MR#:  [id number]  DATE OF BIRTH:  [birth year]  SUBJECTIVE:  CHIEF COMPLAINT:   Chief Complaint  Patient presents with  . Abdominal Pain   - Abdominal pain has improved - nausea/vomiting improved as well - has been NPO all morning  REVIEW OF SYSTEMS:  Review of Systems  Constitutional: Negative for fever, chills and weight loss.  HENT: Negative for ear discharge, ear pain and nosebleeds.   Eyes: Negative for blurred vision, double vision and photophobia.  Respiratory: Negative for cough, hemoptysis, shortness of breath and wheezing.   Cardiovascular: Negative for chest pain, palpitations, orthopnea and leg swelling.  Gastrointestinal: Positive for abdominal pain. Negative for heartburn, nausea, vomiting, diarrhea, constipation and melena.  Genitourinary: Negative for dysuria, urgency, frequency and hematuria.  Musculoskeletal: Negative for myalgias, back pain and neck pain.  Skin: Negative for rash.  Neurological: Positive for weakness. Negative for dizziness, tingling, tremors, sensory change, speech change, focal weakness and headaches.  Endo/Heme/Allergies: Does not bruise/bleed easily.  Psychiatric/Behavioral: Negative for depression.    DRUG ALLERGIES:   Allergies  Allergen Reactions  . Valsartan Other (See Comments)    Unsure of symptoms, was on list from previous hospital and patient was unsure of name of medicine.  . Vitamin D Analogs Other (See Comments)    Bloating and swelling in abdomen region, indigestion. Prescription vit d not otc    VITALS:  Blood pressure 141/77, pulse 70, temperature 98.5 F (36.9 C), temperature source Oral, resp. rate 18, height 5' 9.5" (1.765 m), weight 89.268 kg (196 lb 12.8 oz), SpO2 97 %.  PHYSICAL EXAMINATION:  Physical Exam  GENERAL:  74 y.o.-year-old patient lying in the bed with no acute distress.  EYES: Pupils equal, round,  reactive to light and accommodation. No scleral icterus. Extraocular muscles intact.  HEENT: Head atraumatic, normocephalic. Oropharynx and nasopharynx clear.  NECK:  Supple, no jugular venous distention. No thyroid enlargement, no tenderness.  LUNGS: Normal breath sounds bilaterally, no wheezing, rales,rhonchi or crepitation. No use of accessory muscles of respiration.  CARDIOVASCULAR: S1, S2 normal. No murmurs, rubs, or gallops.  ABDOMEN: Soft, non tender but some discomfort in RUQ region, nondistended. Bowel sounds present. No organomegaly or mass.  EXTREMITIES: No pedal edema, cyanosis, or clubbing.  NEUROLOGIC: Cranial nerves II through XII are intact. Muscle strength 5/5 in all extremities. Sensation intact. Gait not checked.  PSYCHIATRIC: The patient is alert and oriented x 3.  SKIN: No obvious rash, lesion, or ulcer.    LABORATORY PANEL:   CBC  Recent Labs Lab 01/24/15 2204  WBC 7.1  HGB 10.1*  HCT 30.6*  PLT 191   ------------------------------------------------------------------------------------------------------------------  Chemistries   Recent Labs Lab 01/24/15 2204  NA 140  K 4.8  CL 116*  CO2 24  GLUCOSE 139*  BUN 50*  CREATININE 3.74*  CALCIUM 8.1*  AST 314*  ALT 238*  ALKPHOS 94  BILITOT 0.8   ------------------------------------------------------------------------------------------------------------------  Cardiac Enzymes  Recent Labs Lab 01/24/15 0354  TROPONINI <0.03   ------------------------------------------------------------------------------------------------------------------  RADIOLOGY:  Ct Abdomen Pelvis Wo Contrast  01/24/2015  CLINICAL DATA:  74 year old male with complaint of mid abdominal pain since 11 p.m. yesterday evening. Pain is located in the central abdomen. EXAM: CT ABDOMEN AND PELVIS WITHOUT CONTRAST TECHNIQUE: Multidetector CT imaging of the abdomen and pelvis was performed following the standard protocol without IV  contrast. COMPARISON:  No priors. FINDINGS: Lower chest: 7 mm  nodule in the medial left lower lobe (image 5 of series 4). Probable scarring and chronic volume loss in the right lower lobe. Atherosclerotic calcifications in the left main, left anterior descending, left circumflex and right coronary arteries. Hepatobiliary: No definite cystic or solid hepatic lesions are identified on today's noncontrast CT examination. Status post cholecystectomy. Pancreas: No pancreatic mass or peripancreatic inflammatory changes identified on today's noncontrast CT examination. Spleen: Unremarkable. Adrenals/Urinary Tract: Bilateral adrenal glands are normal in appearance. Bilateral kidneys are normal in appearance. No urinary tract calculi. No hydroureteronephrosis. Unenhanced appearance of the visualized portions of the urinary bladder (partially obscured by beam hardening artifact from the patient's right hip prosthesis) is unremarkable. Stomach/Bowel: Unenhanced appearance of the stomach is normal. No pathologic dilatation of small bowel or colon. There are some loops of distal ileum in the right lower quadrant which have a small amount of free fluid adjacent to them laterally (image 66 of series 2, and coronal image 57 of series 5). Normal appendix. Vascular/Lymphatic: Atherosclerosis throughout the abdominal and pelvic vasculature, without evidence of aneurysm. No lymphadenopathy noted in the abdomen or pelvis on today's noncontrast CT examination. Reproductive: Prostate gland and seminal vesicles are incompletely visualized secondary to beam hardening artifact, but appear generally unremarkable. Other: No significant volume of ascites. No pneumoperitoneum. Small ventral hernia in the epigastric region containing only omental fat. 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 There are no aggressive appearing lytic or blastic lesions noted in the  visualized portions of the skeleton. IMPRESSION: 1. Small volume of fluid adjacent to some loops of bowel in the right side of the abdomen (distal ileum). This is nonspecific, but could indicate a focus of enteritis, with some reactive free fluid adjacent to inflamed loops of small bowel. 2. Small ventral hernia in the epigastric region containing only omental fat. No associated bowel incarceration or obstruction at this time. 3. 7 mm left lower lobe pulmonary nodule. If the patient is at high risk for bronchogenic carcinoma, follow-up chest CT at 3-33months is recommended. If the patient is at low risk for bronchogenic carcinoma, follow-up chest CT at 6-12 months is recommended. This recommendation follows the consensus statement: Guidelines for Management of Small Pulmonary Nodules Detected on CT Scans: A Statement from the Fleischner Society as published in Radiology 2005; 237:395-400. 4. Atherosclerosis, including left main and 3 vessel coronary artery disease. Please note that although the presence of coronary artery calcium documents the presence of coronary artery disease, the severity of this disease and any potential stenosis cannot be assessed on this non-gated CT examination. Assessment for potential risk factor modification, dietary therapy or pharmacologic therapy may be warranted, if clinically indicated. 5. Additional incidental findings, as above. Electronically Signed   By: Vinnie Langton M.D.   On: 01/24/2015 07:30   Dg Abd Acute W/chest  01/25/2015  CLINICAL DATA:  Abdominal pain, diarrhea and vomiting for 2-3 days. Assess for small bowel obstruction or ileus. EXAM: DG ABDOMEN ACUTE W/ 1V CHEST COMPARISON:  CT abdomen pelvis January 24, 2015 at 7:49 a.m. FINDINGS: Cardiac silhouette appears upper limits of normal in size, mediastinal silhouette is nonsuspicious, mildly calcified aortic knob. Elevated RIGHT hemidiaphragm. Lungs are clear, no pleural effusions. No pneumothorax. Soft tissue  planes and included osseous structures are nonacute ; low LEFT posterior rib fractures. Contrast distended small bowel with air-fluid levels at a similar level, small bowel measures up to 3.5 cm by plain radiography. Paucity of large bowel  gas. Surgical clips in the included right abdomen compatible with cholecystectomy. No intraperitoneal free air. Seminal vesicle calcifications most often associated with diabetes. 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 IMPRESSION: No acute cardiopulmonary process. Findings suspicious for small bowel obstruction, less likely ileus. Electronically Signed   By: Elon Alas M.D.   On: 01/25/2015 00:05   US Abdomen Limited Ruq  01/25/2015  CLINICAL DATA:  Acute hepatitis, remote cholecystectomy EXAM: US ABDOMEN LIMITED - RIGHT UPPER QUADRANT COMPARISON:  01/24/2015 FINDINGS: Gallbladder: Surgically absent Common bile duct: Diameter: 5 mm Liver: Patent portal and hepatic veins with normal directional flow. No focal hepatic abnormality or biliary dilatation. Included views the right kidney demonstrate slight increased cortical echogenicity. No hydronephrosis. IMPRESSION: Prior cholecystectomy. No biliary dilatation No acute finding by ultrasound. Electronically Signed   By: Jerilynn Mages.  Shick M.D.   On: 01/25/2015 12:00    EKG:   Orders placed or performed during the hospital encounter of 01/24/15  . ED EKG  . ED EKG    ASSESSMENT AND PLAN:   74 year old male with past medical history significant for hyperlipidemia, hypertension, coronary artery disease and diabetes mellitus presents to the hospital secondary to worsening abdominal pain and nausea vomiting.  #1 acute enteritis-as noted on CT abdomen. No significant obstruction seen. -Abdominal pain and nausea vomiting are improving. No need for NG tube. -Appreciate surgical consult. -Follow-up KUB in morning. Started on clear liquid diet today. -Continue IV Zosyn for now. 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 -Stable for now. Gentle hydration. Avoid nephrotoxins. -Follows as outpatient with [facility] nephrology -Continue oral sodium bicarbonate  #3 elevated LFTs-likely viral -Follow-up hepatitis panel. -Check liver tests in morning. Ultrasound with no hepatic issues. Patient is status post cholecystectomy  #4 hypertension-continue home medications. On Norvasc, Coreg  #5 diabetes mellitus-continue Lantus at decreased dose and sliding scale insulin while patient is on clear liquids  #6 DVT prophylaxis-subcutaneous heparin    All the records are reviewed and case discussed with Care Management/Social Workerr. Management plans discussed with the patient, family and they are in agreement.  CODE STATUS: Full Code  TOTAL TIME TAKING CARE OF THIS PATIENT: 37 minutes.   POSSIBLE D/C IN 1-2 DAYS, DEPENDING ON CLINICAL CONDITION.   Gladstone Lighter M.D on 01/25/2015 at 3:09 PM  Between 7am to 6pm - Pager - [phone]  After 6pm go to [email or web address] - password [email or web address] [facility]  [place] [facility]  Office  [phone]  CC: Primary care physician; Adrian Prows, MD

## 2015-01-25 NOTE — Care Management Note (Addendum)
Case Management Note  Patient Details  Name: SUHAAN PERLEBERG MRN: [id number] Date of Birth: 06-Oct-1940  Subjective/Objective:   RNCM assessment for discharge planning. Met with patient and his wife at bedside. It is just the two of them in the home.  Prior to admission, he was independent, active and required no DME. Goes to cardiac rehab 3 times a week. Denies issues with transportation, medications or medical care. At this point, no discharge needs identified or anticipated.                  Action/Plan: Home with self care.   Expected Discharge Date:                  Expected Discharge Plan:  Home/Self Care  In-House Referral:     Discharge planning Services     Post Acute Care Choice:    Choice offered to:     DME Arranged:    DME Agency:     HH Arranged:    HH Agency:     Status of Service:  In process, will continue to follow  Medicare Important Message Given:  Yes-second notification given Date Medicare IM Given:    Medicare IM give by:    Date Additional Medicare IM Given:    Additional Medicare Important Message give by:     If discussed at [place] of Stay Meetings, dates discussed:    Additional Comments:  Jolly Mango, RN 01/25/2015, 12:59 PM

## 2015-01-26 ENCOUNTER — Inpatient Hospital Stay: Payer: Medicare Other

## 2015-01-26 DIAGNOSIS — R1084 Generalized abdominal pain: Secondary | ICD-10-CM

## 2015-01-26 LAB — BASIC METABOLIC PANEL
Anion gap: 3 — ABNORMAL LOW (ref 5–15)
BUN: 37 mg/dL — ABNORMAL HIGH (ref 6–20)
CALCIUM: 8 mg/dL — AB (ref 8.9–10.3)
CHLORIDE: 118 mmol/L — AB (ref 101–111)
CO2: 25 mmol/L (ref 22–32)
CREATININE: 3.58 mg/dL — AB (ref 0.61–1.24)
GFR calc Af Amer: 18 mL/min — ABNORMAL LOW (ref >60)
GFR calc non Af Amer: 16 mL/min — ABNORMAL LOW (ref >60)
GLUCOSE: 138 mg/dL — AB (ref 65–99)
Potassium: 4.7 mmol/L (ref 3.5–5.1)
Sodium: 146 mmol/L — ABNORMAL HIGH (ref 135–145)

## 2015-01-26 LAB — T4: T4, Total: 5.3 ug/dL (ref 4.5–12.0)

## 2015-01-26 LAB — GLUCOSE, CAPILLARY
Glucose-Capillary: 137 mg/dL — ABNORMAL HIGH (ref 65–99)
Glucose-Capillary: 159 mg/dL — ABNORMAL HIGH (ref 65–99)
Glucose-Capillary: 144 mg/dL — ABNORMAL HIGH (ref 65–99)
Glucose-Capillary: 200 mg/dL — ABNORMAL HIGH (ref 65–99)
Glucose-Capillary: 175 mg/dL — ABNORMAL HIGH (ref 65–99)

## 2015-01-26 MED ORDER — METRONIDAZOLE 500 MG PO TABS
500.0000 mg | ORAL_TABLET | Freq: Three times a day (TID) | ORAL | Status: DC
  Administered 2015-01-26 – 2015-01-27 (×3): 500 mg via ORAL
  Filled 2015-01-26 (×3): qty 1

## 2015-01-26 MED ORDER — CIPROFLOXACIN HCL 500 MG PO TABS: 500.0000 mg | ORAL_TABLET | Freq: Two times a day (BID) | ORAL | Status: DC

## 2015-01-26 MED ORDER — INSULIN GLARGINE 100 UNIT/ML ~~LOC~~ SOLN
12.0000 [IU] | Freq: Every day | SUBCUTANEOUS | Status: DC
  Administered 2015-01-26: 12 [IU] via SUBCUTANEOUS
  Filled 2015-01-26 (×2): qty 0.12

## 2015-01-26 MED ORDER — CIPROFLOXACIN HCL 250 MG PO TABS: 250.0000 mg | ORAL_TABLET | Freq: Two times a day (BID) | ORAL | Status: DC

## 2015-01-26 MED ORDER — CIPROFLOXACIN HCL 500 MG PO TABS
500.0000 mg | ORAL_TABLET | ORAL | Status: DC
  Administered 2015-01-26 – 2015-01-27 (×2): 500 mg via ORAL
  Filled 2015-01-26 (×2): qty 1

## 2015-01-26 NOTE — Progress Notes (Signed)
Surgery Progress Note  S: No abdominal pain.  No nausea/vomiting, tolerating liquids O:Blood pressure 144/76, pulse 76, temperature 98.7 F (37.1 C), temperature source Oral, resp. rate 18, height 5' 9.5" (1.765 m), weight 201 lb 9.6 oz (91.445 kg), SpO2 100 %. GEN: NAD/A&Ox3 ABD: soft, min tender, nondistended  A/P 74 yo admit with enteritis vs ileus.  Doing well - regular diet - would recheck lft in am

## 2015-01-26 NOTE — Progress Notes (Signed)
[place] at [place] NAME: Todd [name]    MR#:  [id number]  DATE OF BIRTH:  [birth year]  SUBJECTIVE:  CHIEF COMPLAINT:   Chief Complaint  Patient presents with  . Abdominal Pain   - Abdominal pain has improved - nausea/vomiting improved as well - has been NPO all morning  REVIEW OF SYSTEMS:  Review of Systems  Constitutional: Negative for fever, chills and weight loss.  HENT: Negative for ear discharge, ear pain and nosebleeds.   Eyes: Negative for blurred vision, double vision and photophobia.  Respiratory: Negative for cough, hemoptysis, shortness of breath and wheezing.   Cardiovascular: Negative for chest pain, palpitations, orthopnea and leg swelling.  Gastrointestinal: Positive for abdominal pain. Negative for heartburn, nausea, vomiting, diarrhea, constipation and melena.  Genitourinary: Negative for dysuria, urgency, frequency and hematuria.  Musculoskeletal: Negative for myalgias, back pain and neck pain.  Skin: Negative for rash.  Neurological: Positive for weakness. Negative for dizziness, tingling, tremors, sensory change, speech change, focal weakness and headaches.  Endo/Heme/Allergies: Does not bruise/bleed easily.  Psychiatric/Behavioral: Negative for depression.    DRUG ALLERGIES:   Allergies  Allergen Reactions  . Valsartan Other (See Comments)    Unsure of symptoms, was on list from previous hospital and patient was unsure of name of medicine.  . Vitamin D Analogs Other (See Comments)    Bloating and swelling in abdomen region, indigestion. Prescription vit d not otc    VITALS:  Blood pressure 144/76, pulse 76, temperature 98.7 F (37.1 C), temperature source Oral, resp. rate 18, height 5' 9.5" (1.765 m), weight 91.445 kg (201 lb 9.6 oz), SpO2 100 %.  PHYSICAL EXAMINATION:  Physical Exam  GENERAL:  74 y.o.-year-old patient lying in the bed with no acute distress.  EYES: Pupils equal, round,  reactive to light and accommodation. No scleral icterus. Extraocular muscles intact.  HEENT: Head atraumatic, normocephalic. Oropharynx and nasopharynx clear.  NECK:  Supple, no jugular venous distention. No thyroid enlargement, no tenderness.  LUNGS: Normal breath sounds bilaterally, no wheezing, rales,rhonchi or crepitation. No use of accessory muscles of respiration.  CARDIOVASCULAR: S1, S2 normal. No murmurs, rubs, or gallops.  ABDOMEN: Soft, non tender but some discomfort in RUQ region, nondistended. Bowel sounds present. No organomegaly or mass.  EXTREMITIES: No pedal edema, cyanosis, or clubbing.  NEUROLOGIC: Cranial nerves II through XII are intact. Muscle strength 5/5 in all extremities. Sensation intact. Gait not checked.  PSYCHIATRIC: The patient is alert and oriented x 3.  SKIN: No obvious rash, lesion, or ulcer.    LABORATORY PANEL:   CBC  Recent Labs Lab 01/24/15 2204  WBC 7.1  HGB 10.1*  HCT 30.6*  PLT 191   ------------------------------------------------------------------------------------------------------------------  Chemistries   Recent Labs Lab 01/24/15 2204 01/26/15 0552  NA 140 146*  K 4.8 4.7  CL 116* 118*  CO2 24 25  GLUCOSE 139* 138*  BUN 50* 37*  CREATININE 3.74* 3.58*  CALCIUM 8.1* 8.0*  AST 314*  --   ALT 238*  --   ALKPHOS 94  --   BILITOT 0.8  --    ------------------------------------------------------------------------------------------------------------------  Cardiac Enzymes  Recent Labs Lab 01/24/15 0354  TROPONINI <0.03   ------------------------------------------------------------------------------------------------------------------  RADIOLOGY:  Dg Abd 2 Views  01/26/2015  CLINICAL DATA:  Abdominal pain. EXAM: ABDOMEN - 2 VIEW COMPARISON:  Abdominal series 11 08/2014.  CT 01/24/2015. FINDINGS: Soft tissue structures are unremarkable. Persistent slightly dilated loop of distal small bowel most likely ilium is  noted the  right lower quadrant . No prominent small bowel distention. Colon is normal in caliber. Oral contrast from prior CT is noted throughout the colon and rectum. No free air. Clips right upper quadrant. Degenerative changes thoracolumbar spine. Right hip replacement. Aortoiliac atherosclerotic vascular disease . IMPRESSION: 1. Persistent minimally dilated loop of distal small bowel, most likely terminal ileum in the right lower quadrant. No evidence of bowel obstruction. Colonic gas pattern is normal. Oral contrast from prior CT is noted throughout the colon rectum. No free air. 2.  Aortoiliac atherosclerotic vascular disease . Electronically Signed   By: Marcello Moores  Register   On: 01/26/2015 07:43   Dg Abd Acute W/chest  01/25/2015  CLINICAL DATA:  Abdominal pain, diarrhea and vomiting for 2-3 days. Assess for small bowel obstruction or ileus. EXAM: DG ABDOMEN ACUTE W/ 1V CHEST COMPARISON:  CT abdomen pelvis January 24, 2015 at 7:49 a.m. FINDINGS: Cardiac silhouette appears upper limits of normal in size, mediastinal silhouette is nonsuspicious, mildly calcified aortic knob. Elevated RIGHT hemidiaphragm. Lungs are clear, no pleural effusions. No pneumothorax. Soft tissue planes and included osseous structures are nonacute ; low LEFT posterior rib fractures. Contrast distended small bowel with air-fluid levels at a similar level, small bowel measures up to 3.5 cm by plain radiography. Paucity of large bowel gas. Surgical clips in the included right abdomen compatible with cholecystectomy. No intraperitoneal free air. Seminal vesicle calcifications most often associated with diabetes. 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 IMPRESSION: No acute cardiopulmonary process. Findings suspicious for small bowel obstruction, less likely ileus. Electronically Signed   By: Elon Alas M.D.   On: 01/25/2015 00:05   US Abdomen Limited Ruq  01/25/2015  CLINICAL DATA:  Acute hepatitis, remote  cholecystectomy EXAM: US ABDOMEN LIMITED - RIGHT UPPER QUADRANT COMPARISON:  01/24/2015 FINDINGS: Gallbladder: Surgically absent Common bile duct: Diameter: 5 mm Liver: Patent portal and hepatic veins with normal directional flow. No focal hepatic abnormality or biliary dilatation. Included views the right kidney demonstrate slight increased cortical echogenicity. No hydronephrosis. IMPRESSION: Prior cholecystectomy. No biliary dilatation No acute finding by ultrasound. Electronically Signed   By: Jerilynn Mages.  Shick M.D.   On: 01/25/2015 12:00    EKG:   Orders placed or performed during the hospital encounter of 01/24/15  . ED EKG  . ED EKG    ASSESSMENT AND PLAN:   74 year old male with past medical history significant for hyperlipidemia, hypertension, coronary artery disease and diabetes mellitus presents to the hospital secondary to worsening abdominal pain and nausea vomiting.  #1 acute enteritis-as noted on CT abdomen. No significant obstruction seen. -Abdominal pain and nausea vomiting are improving. No need for NG tube. -Appreciate surgical consult. -KUB with improvement. 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 -Stable for now. Avoid nephrotoxins. -Follows as outpatient with [facility] nephrology -Continue oral sodium bicarbonate  #3 elevated LFTs-likely viral -Follow-up hepatitis panel. -Check liver tests in morning. Ultrasound with no hepatic issues. Patient is status post cholecystectomy  #4 hypertension-continue home medications. On Norvasc, Coreg  #5 diabetes mellitus-continue Lantus -increased back to home dose due to being started on a regular diet and sliding scale insulin   #6 DVT prophylaxis-subcutaneous heparin  Possible discharge tomorrow  All the records are reviewed and case discussed with Care Management/Social Workerr. Management plans discussed with the patient, family and they are in  agreement.  CODE STATUS: Full Code  TOTAL  TIME TAKING CARE OF THIS PATIENT: 37 minutes.   POSSIBLE D/C TOMORROW, DEPENDING ON CLINICAL CONDITION.   Gladstone Lighter M.D on 01/26/2015 at 3:15 PM  Between 7am to 6pm - Pager - [phone]  After 6pm go to [email or web address] - password [email or web address] [facility]  [place] [facility]  Office  [phone]  CC: Primary care physician; Adrian Prows, MD

## 2015-01-26 NOTE — Progress Notes (Signed)
Surgery Progress Note  Patient in xray when I went to evaluate him.  KUB reviewed, contrast in colon without significant sb distention.  If patient without pain, nausea/vomiting, hungry, then okay to advance diet.  I will be by later to see him.

## 2015-01-26 NOTE — Progress Notes (Addendum)
ANTIBIOTIC CONSULT NOTE - INITIAL  Pharmacy Consult for Ciprofloxacin Indication: intra abdominal infection  Allergies  Allergen Reactions  . Valsartan Other (See Comments)    Unsure of symptoms, was on list from previous hospital and patient was unsure of name of medicine.  . Vitamin D Analogs Other (See Comments)    Bloating and swelling in abdomen region, indigestion. Prescription vit d not otc    Patient Measurements: Height: 5' 9.5" (176.5 cm) Weight: 201 lb 9.6 oz (91.445 kg) IBW/kg (Calculated) : 71.85 Adjusted Body Weight:   Vital Signs: Temp: 98.7 F (37.1 C) (11/08 1313) Temp Source: Oral (11/08 1313) BP: 144/76 mmHg (11/08 1313) Pulse Rate: 76 (11/08 1313) Intake/Output from previous day: 11/07 0701 - 11/08 0700 In: 2623 [I.V.:2499; IV Piggyback:124] Out: 1025 [Urine:1025] Intake/Output from this shift: Total I/O In: 986 [P.O.:290; I.V.:666; IV Piggyback:30] Out: 950 [Urine:950]  Labs:  Recent Labs  01/24/15 0354 01/24/15 2204 01/26/15 0552  WBC 8.7 7.1  --   HGB 10.6* 10.1*  --   PLT 189 191  --   CREATININE 3.83* 3.74* 3.58*   Estimated Creatinine Clearance: 20.7 mL/min (by C-G formula based on Cr of 3.58). No results for input(s): VANCOTROUGH, VANCOPEAK, VANCORANDOM, GENTTROUGH, GENTPEAK, GENTRANDOM, TOBRATROUGH, TOBRAPEAK, TOBRARND, AMIKACINPEAK, AMIKACINTROU, AMIKACIN in the last 72 hours.   Microbiology: No results found for this or any previous visit (from the past 720 hour(s)).  Medical History: Past Medical History  Diagnosis Date  . PONV (postoperative nausea and vomiting)   . Hyperlipidemia     takes Crestor daily  . Hypertension     takes Amlodipine and Metoprolol  . Coronary artery disease   . 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 Joint pain     knees,elbows,back  . Diabetes mellitus     lantus and novolog;fasting sugars run 70-120    Medications:  Prescriptions prior to admission  Medication Sig Dispense Refill Last Dose  .  amLODipine (NORVASC) 5 MG tablet Take 10 mg by mouth daily after breakfast.    01/25/2015 at Unknown time  . aspirin EC 81 MG tablet Take 81 mg by mouth daily.     01/25/2015 at Unknown time  . carvedilol (COREG) 6.25 MG tablet Take 6.25 mg by mouth 2 (two) times daily with a meal.   01/25/2015 at pm  . cholecalciferol (VITAMIN D) 1000 UNITS tablet Take 1,000 Units by mouth daily.     01/25/2015 at Unknown time  . ciprofloxacin (CIPRO) 500 MG tablet Take 1 tablet (500 mg total) by mouth 2 (two) times daily. 14 tablet 0 01/25/2015 at Unknown time  . insulin glargine (LANTUS) 100 UNIT/ML injection Inject 11 Units into the skin at bedtime.    01/24/2015 at Unknown time  . insulin lispro (HUMALOG) 100 UNIT/ML injection Inject 6 Units into the skin daily with supper.   01/25/2015 at Unknown time  . loratadine (CLARITIN) 10 MG tablet Take 10 mg by mouth daily.   01/25/2015 at Unknown time  . metroNIDAZOLE (FLAGYL) 500 MG tablet Take 1 tablet (500 mg total) by mouth 2 (two) times daily. 14 tablet 0 01/25/2015 at Unknown time  . oxyCODONE (OXY IR/ROXICODONE) 5 MG immediate release tablet Take 5-10 mg by mouth every 4 (four) hours as needed for severe pain.   01/25/2015 at Unknown time  . rosuvastatin (CRESTOR) 10 MG tablet Take 20 mg by mouth daily.    01/24/2015 at Unknown time  . sodium bicarbonate 650 MG tablet Take 1,300 mg  by mouth 3 (three) times daily.     01/25/2015 at pm  . sodium polystyrene (KAYEXALATE) 15 GM/60ML suspension Take 15 g by mouth once.   PRN at [facility]  . traMADol (ULTRAM) 50 MG tablet Take 50 mg by mouth every 6 (six) hours as needed for moderate pain.   01/25/2015 at Unknown time   Assessment: CrCl = 20.7 ml/min  Goal of Therapy:  Resolution of infection  Plan:  Expected duration 7 days with resolution of temperature and/or normalization of WBC  Ciprofloxacin 500 mg PO BID originally ordered.  Will adjust dose to ciprofloxacin 500 mg PO Q18H.   Jaxsyn Azam D 01/26/2015,3:47 PM

## 2015-01-27 LAB — COMPREHENSIVE METABOLIC PANEL
ALT: 119 U/L — AB (ref 17–63)
AST: 52 U/L — AB (ref 15–41)
Albumin: 2.9 g/dL — ABNORMAL LOW (ref 3.5–5.0)
Alkaline Phosphatase: 91 U/L (ref 38–126)
Anion gap: 8 (ref 5–15)
BUN: 38 mg/dL — AB (ref 6–20)
CHLORIDE: 113 mmol/L — AB (ref 101–111)
CO2: 24 mmol/L (ref 22–32)
CREATININE: 3.69 mg/dL — AB (ref 0.61–1.24)
Calcium: 8.1 mg/dL — ABNORMAL LOW (ref 8.9–10.3)
GFR calc non Af Amer: 15 mL/min — ABNORMAL LOW (ref >60)
GFR, EST AFRICAN AMERICAN: 17 mL/min — AB (ref >60)
Glucose, Bld: 157 mg/dL — ABNORMAL HIGH (ref 65–99)
POTASSIUM: 4.5 mmol/L (ref 3.5–5.1)
SODIUM: 145 mmol/L (ref 135–145)
Total Bilirubin: 0.4 mg/dL (ref 0.3–1.2)
Total Protein: 6 g/dL — ABNORMAL LOW (ref 6.5–8.1)

## 2015-01-27 LAB — HEPATITIS PANEL, ACUTE
HCV Ab: <0.1 s/co ratio (ref 0.0–0.9)
HEP B S AG: NEGATIVE
Hep A IgM: NEGATIVE
Hep B C IgM: NEGATIVE

## 2015-01-27 LAB — GLUCOSE, CAPILLARY
Glucose-Capillary: 132 mg/dL — ABNORMAL HIGH (ref 65–99)
Glucose-Capillary: 185 mg/dL — ABNORMAL HIGH (ref 65–99)

## 2015-01-27 MED ORDER — CARVEDILOL 6.25 MG PO TABS: 6.2500 mg | ORAL_TABLET | Freq: Once | ORAL | Status: DC

## 2015-01-27 MED ORDER — METRONIDAZOLE 500 MG PO TABS: 500.0000 mg | ORAL_TABLET | Freq: Three times a day (TID) | ORAL | Status: DC

## 2015-01-27 MED ORDER — CARVEDILOL 6.25 MG PO TABS
6.2500 mg | ORAL_TABLET | Freq: Once | ORAL | Status: AC
  Administered 2015-01-27: 6.25 mg via ORAL

## 2015-01-27 MED ORDER — CIPROFLOXACIN HCL 250 MG PO TABS: 250.0000 mg | ORAL_TABLET | Freq: Two times a day (BID) | ORAL | Status: DC

## 2015-01-27 MED ORDER — CARVEDILOL 6.25 MG PO TABS: 12.5000 mg | ORAL_TABLET | Freq: Two times a day (BID) | ORAL | Status: DC

## 2015-01-27 NOTE — Care Management (Signed)
Spoke with patient who is up for discharge today. He is alert and oriented from home with spouse. Patient sitting in chair. States that he still drives and is independent. He denies any issues with purchasing medications or making appointments.  No CM needs identified.

## 2015-01-27 NOTE — Discharge Instructions (Signed)
Activity as tolerated Diet-diabetic  follow-up with primary care physician in a week-PCP to consider repeating LFTs in 5-7 days Please follow-up on the hepatitis panel which is pending Statin is held, PCP can resume once LFTs are normal Follow-up with nephrology in 2 weeks Follow-up with gastroenterology as needed basis

## 2015-01-27 NOTE — Discharge Summary (Signed)
[facility] [place] NAME: Todd [name]    MR#:  [id number]  DATE OF BIRTH:  Aug 07, 1940  DATE OF ADMISSION:  01/24/2015 ADMITTING PHYSICIAN: Harrie Foreman, MD  DATE OF DISCHARGE: 01/27/2015 PRIMARY CARE PHYSICIAN: Adrian Prows, MD    ADMISSION DIAGNOSIS:  Enteritis [K52.9] Generalized abdominal pain [R10.84] Intractable vomiting with nausea, vomiting of unspecified type [R11.10]  DISCHARGE DIAGNOSIS:  Active Problems:   Abdominal pain   Acute hepatitis   Enteritis   Ileus ([place])   Uncontrollable vomiting   SECONDARY DIAGNOSIS:   Past Medical History  Diagnosis Date  . PONV (postoperative nausea and vomiting)   . Hyperlipidemia     takes Crestor daily  . Hypertension     takes Amlodipine and Metoprolol  . Coronary artery disease   . 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 Joint pain     knees,elbows,back  . Diabetes mellitus     lantus and novolog;fasting sugars run 70-120    HOSPITAL COURSE:   74 year old male with past medical history significant for hyperlipidemia, hypertension, coronary artery disease and diabetes mellitus presents to the hospital secondary to worsening abdominal pain and nausea vomiting.  #1 acute enteritis-as noted on CT abdomen. No significant obstruction seen. -Abdominal pain and nausea vomiting are improved . No need for NG tube. -Appreciate surgical consult, no interventions needed as clinically improved with conservative rx  -KUB with improvement. 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 -Stable for now. Avoid nephrotoxins. -Follows as outpatient with [facility] nephrology  #3 elevated LFTs-likely viral - PCP to follow-up  hepatitis panel. - Ultrasound with no hepatic issues. Patient is status post cholecystectomy  #4 hypertension-continue home medications. On Norvasc, Coreg  #5 diabetes mellitus-continue Lantus -increased back to home dose due to being started on a regular diet and sliding scale insulin   #6 DVT prophylaxis-subcutaneous heparin   DISCHARGE CONDITIONS:   fair  CONSULTS OBTAINED:  Treatment Team:  Marlyce Huge, MD   PROCEDURES none  DRUG ALLERGIES:   Allergies  Allergen Reactions  . Valsartan Other (See Comments)    Unsure of symptoms, was on list from previous hospital and patient was unsure of name of medicine.  . Vitamin D Analogs Other (See Comments)    Bloating and swelling in abdomen region, indigestion. Prescription vit d not otc    DISCHARGE MEDICATIONS:   Current Discharge Medication List    CONTINUE these medications which have CHANGED   Details  !! carvedilol (COREG) 6.25 MG tablet Take 2 tablets (12.5 mg total) by mouth 2 (two) times daily with a meal. Qty: 60 tablet, Refills: 0    !! carvedilol (COREG) 6.25 MG tablet Take 1 tablet (6.25 mg total) by mouth once. Qty: 1 tablet, Refills: 0    ciprofloxacin (CIPRO) 250 MG tablet Take 1 tablet (250 mg total) by mouth 2 (two) times daily. Qty: 7 tablet, Refills: 0    metroNIDAZOLE (FLAGYL) 500 MG tablet Take 1 tablet (500 mg total) by mouth every 8 (eight) hours. Qty: 21 tablet, Refills: 0     !! - Potential duplicate medications found. Please discuss with provider.    CONTINUE these medications which have NOT  CHANGED   Details  amLODipine (NORVASC) 5 MG tablet Take 10 mg by mouth daily after breakfast.     aspirin EC 81 MG tablet Take 81 mg by mouth daily.      cholecalciferol (VITAMIN D) 1000 UNITS tablet Take 1,000 Units by mouth daily.      insulin glargine (LANTUS) 100 UNIT/ML injection Inject 11 Units into the skin at bedtime.     insulin lispro (HUMALOG) 100 UNIT/ML injection Inject 6  Units into the skin daily with supper.    loratadine (CLARITIN) 10 MG tablet Take 10 mg by mouth daily.    oxyCODONE (OXY IR/ROXICODONE) 5 MG immediate release tablet Take 5-10 mg by mouth every 4 (four) hours as needed for severe pain.    sodium bicarbonate 650 MG tablet Take 1,300 mg by mouth 3 (three) times daily.      sodium polystyrene (KAYEXALATE) 15 GM/60ML suspension Take 15 g by mouth once.    traMADol (ULTRAM) 50 MG tablet Take 50 mg by mouth every 6 (six) hours as needed for moderate pain.      STOP taking these medications     rosuvastatin (CRESTOR) 10 MG tablet          DISCHARGE INSTRUCTIONS:   Activity as tolerated Diet-diabetic  follow-up with primary care physician in a week-PCP to consider repeating LFTs in 5-7 days Follow-up with nephrology in 2 weeks Follow-up with gastroenterology as needed basis  DIET:  Diabetic  DISCHARGE CONDITION:  fair  ACTIVITY:  As tolerated  OXYGEN:  Home Oxygen: no    Oxygen Delivery: no   DISCHARGE LOCATION:  home  If you experience worsening of your admission symptoms, develop shortness of breath, life threatening emergency, suicidal or homicidal thoughts you must seek medical attention immediately by calling 911 or calling your MD immediately  if symptoms less severe.  You Must read complete instructions/literature along with all the possible adverse reactions/side effects for all the Medicines you take and that have been prescribed to you. Take any new Medicines after you have completely understood and accpet all the possible adverse reactions/side effects.   Please note  You were cared for by a hospitalist during your hospital stay. If you have any questions about your discharge medications or the care you received while you were in the hospital after you are discharged, you can call the unit and asked to speak with the hospitalist on call if the hospitalist that took care of you is not available. Once you are  discharged, your primary care physician will handle any further medical issues. Please note that NO REFILLS for any discharge medications will be authorized once you are discharged, as it is imperative that you return to your primary care physician (or establish a relationship with a primary care physician if you do not have one) for your aftercare needs so that they can reassess your need for medications and monitor your lab values.     Today  Chief Complaint  Patient presents with  . Abdominal Pain   Pt is feeling fine, no abd pain, nausea or vomiting , tolerating diet  ROS:  CONSTITUTIONAL: Denies fevers, chills. Denies any fatigue, weakness.  EYES: Denies blurry vision, double vision, eye pain. EARS, NOSE, THROAT: Denies tinnitus, ear pain, hearing loss. RESPIRATORY: Denies cough, wheeze, shortness of breath.  CARDIOVASCULAR: Denies chest pain, palpitations, edema.  GASTROINTESTINAL: Denies nausea, vomiting, diarrhea, abdominal pain. Denies bright red blood per rectum. GENITOURINARY: Denies dysuria, hematuria. ENDOCRINE: Denies nocturia or thyroid  problems. HEMATOLOGIC AND LYMPHATIC: Denies easy bruising or bleeding. SKIN: Denies rash or lesion. MUSCULOSKELETAL: Denies pain in neck, back, shoulder, knees, hips or arthritic symptoms.  NEUROLOGIC: Denies paralysis, paresthesias.  PSYCHIATRIC: Denies anxiety or depressive symptoms.   VITAL SIGNS:  Blood pressure 167/74, pulse 75, temperature 97.4 F (36.3 C), temperature source Oral, resp. rate 18, height 5' 9.5" (1.765 m), weight 94.1 kg (207 lb 7.3 oz), SpO2 96 %.  I/O:   Intake/Output Summary (Last 24 hours) at 01/27/15 1147 Last data filed at 01/27/15 0830  Gross per 24 hour  Intake    936 ml  Output    300 ml  Net    636 ml    PHYSICAL EXAMINATION:  GENERAL:  74 y.o.-year-old patient lying in the bed with no acute distress.  EYES: Pupils equal, round, reactive to light and accommodation. No scleral icterus.  Extraocular muscles intact.  HEENT: Head atraumatic, normocephalic. Oropharynx and nasopharynx clear.  NECK:  Supple, no jugular venous distention. No thyroid enlargement, no tenderness.  LUNGS: Normal breath sounds bilaterally, no wheezing, rales,rhonchi or crepitation. No use of accessory muscles of respiration.  CARDIOVASCULAR: S1, S2 normal. No murmurs, rubs, or gallops.  ABDOMEN: Soft, non-tender, non-distended. Bowel sounds present. No organomegaly or mass.  EXTREMITIES: No pedal edema, cyanosis, or clubbing.  NEUROLOGIC: Cranial nerves II through XII are intact. Muscle strength 5/5 in all extremities. Sensation intact. Gait not checked.  PSYCHIATRIC: The patient is alert and oriented x 3.  SKIN: No obvious rash, lesion, or ulcer.   DATA REVIEW:   CBC  Recent Labs Lab 01/24/15 2204  WBC 7.1  HGB 10.1*  HCT 30.6*  PLT 191    Chemistries   Recent Labs Lab 01/27/15  NA 145  K 4.5  CL 113*  CO2 24  GLUCOSE 157*  BUN 38*  CREATININE 3.69*  CALCIUM 8.1*  AST 52*  ALT 119*  ALKPHOS 91  BILITOT 0.4    Cardiac Enzymes  Recent Labs Lab 01/24/15 0354  TROPONINI <0.03    Microbiology Results  Results for orders placed or performed in visit on 09/04/11  MRSA PCR Screening     Status: None   Collection Time: 09/04/11  2:20 PM  Result Value Ref Range Status   Micro Text Report   Final       COMMENT                   NEGATIVE - MRSA target DNA not detected   ANTIBIOTIC                                                      Urine culture     Status: None   Collection Time: 09/04/11  2:46 PM  Result Value Ref Range Status   Micro Text Report   Final       SOURCE: CC    COMMENT                   NO GROWTH IN 36 HOURS   ANTIBIOTIC  RADIOLOGY:  Ct Abdomen Pelvis Wo Contrast  01/24/2015  CLINICAL DATA:  74 year old male with complaint of mid abdominal pain since 11 p.m. yesterday evening. Pain is located  in the central abdomen. EXAM: CT ABDOMEN AND PELVIS WITHOUT CONTRAST TECHNIQUE: Multidetector CT imaging of the abdomen and pelvis was performed following the standard protocol without IV contrast. COMPARISON:  No priors. FINDINGS: Lower chest: 7 mm nodule in the medial left lower lobe (image 5 of series 4). Probable scarring and chronic volume loss in the right lower lobe. Atherosclerotic calcifications in the left main, left anterior descending, left circumflex and right coronary arteries. Hepatobiliary: No definite cystic or solid hepatic lesions are identified on today's noncontrast CT examination. Status post cholecystectomy. Pancreas: No pancreatic mass or peripancreatic inflammatory changes identified on today's noncontrast CT examination. Spleen: Unremarkable. Adrenals/Urinary Tract: Bilateral adrenal glands are normal in appearance. Bilateral kidneys are normal in appearance. No urinary tract calculi. No hydroureteronephrosis. Unenhanced appearance of the visualized portions of the urinary bladder (partially obscured by beam hardening artifact from the patient's right hip prosthesis) is unremarkable. Stomach/Bowel: Unenhanced appearance of the stomach is normal. No pathologic dilatation of small bowel or colon. There are some loops of distal ileum in the right lower quadrant which have a small amount of free fluid adjacent to them laterally (image 66 of series 2, and coronal image 57 of series 5). Normal appendix. Vascular/Lymphatic: Atherosclerosis throughout the abdominal and pelvic vasculature, without evidence of aneurysm. No lymphadenopathy noted in the abdomen or pelvis on today's noncontrast CT examination. Reproductive: Prostate gland and seminal vesicles are incompletely visualized secondary to beam hardening artifact, but appear generally unremarkable. Other: No significant volume of ascites. No pneumoperitoneum. Small ventral hernia in the epigastric region containing only omental fat.  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 There are no aggressive appearing lytic or blastic lesions noted in the visualized portions of the skeleton. IMPRESSION: 1. Small volume of fluid adjacent to some loops of bowel in the right side of the abdomen (distal ileum). This is nonspecific, but could indicate a focus of enteritis, with some reactive free fluid adjacent to inflamed loops of small bowel. 2. Small ventral hernia in the epigastric region containing only omental fat. No associated bowel incarceration or obstruction at this time. 3. 7 mm left lower lobe pulmonary nodule. If the patient is at high risk for bronchogenic carcinoma, follow-up chest CT at 3-10months is recommended. If the patient is at low risk for bronchogenic carcinoma, follow-up chest CT at 6-12 months is recommended. This recommendation follows the consensus statement: Guidelines for Management of Small Pulmonary Nodules Detected on CT Scans: A Statement from the Fleischner Society as published in Radiology 2005; 237:395-400. 4. Atherosclerosis, including left main and 3 vessel coronary artery disease. Please note that although the presence of coronary artery calcium documents the presence of coronary artery disease, the severity of this disease and any potential stenosis cannot be assessed on this non-gated CT examination. Assessment for potential risk factor modification, dietary therapy or pharmacologic therapy may be warranted, if clinically indicated. 5. Additional incidental findings, as above. Electronically Signed   By: Vinnie Langton M.D.   On: 01/24/2015 07:30   Dg Abd 2 Views  01/26/2015  CLINICAL DATA:  Abdominal pain. EXAM: ABDOMEN - 2 VIEW COMPARISON:  Abdominal series 11 08/2014.  CT 01/24/2015. FINDINGS: Soft tissue structures are unremarkable. Persistent slightly dilated loop of distal small bowel most likely ilium is noted  the right lower  quadrant . No prominent small bowel distention. Colon is normal in caliber. Oral contrast from prior CT is noted throughout the colon and rectum. No free air. Clips right upper quadrant. Degenerative changes thoracolumbar spine. Right hip replacement. Aortoiliac atherosclerotic vascular disease . IMPRESSION: 1. Persistent minimally dilated loop of distal small bowel, most likely terminal ileum in the right lower quadrant. No evidence of bowel obstruction. Colonic gas pattern is normal. Oral contrast from prior CT is noted throughout the colon rectum. No free air. 2.  Aortoiliac atherosclerotic vascular disease . Electronically Signed   By: Marcello Moores  Register   On: 01/26/2015 07:43   Dg Abd Acute W/chest  01/25/2015  CLINICAL DATA:  Abdominal pain, diarrhea and vomiting for 2-3 days. Assess for small bowel obstruction or ileus. EXAM: DG ABDOMEN ACUTE W/ 1V CHEST COMPARISON:  CT abdomen pelvis January 24, 2015 at 7:49 a.m. FINDINGS: Cardiac silhouette appears upper limits of normal in size, mediastinal silhouette is nonsuspicious, mildly calcified aortic knob. Elevated RIGHT hemidiaphragm. Lungs are clear, no pleural effusions. No pneumothorax. Soft tissue planes and included osseous structures are nonacute ; low LEFT posterior rib fractures. Contrast distended small bowel with air-fluid levels at a similar level, small bowel measures up to 3.5 cm by plain radiography. Paucity of large bowel gas. Surgical clips in the included right abdomen compatible with cholecystectomy. No intraperitoneal free air. Seminal vesicle calcifications most often associated with diabetes. 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 IMPRESSION: No acute cardiopulmonary process. Findings suspicious for small bowel obstruction, less likely ileus. Electronically Signed   By: Elon Alas M.D.   On: 01/25/2015 00:05   US Abdomen Limited Ruq  01/25/2015  CLINICAL DATA:  Acute hepatitis, remote  cholecystectomy EXAM: US ABDOMEN LIMITED - RIGHT UPPER QUADRANT COMPARISON:  01/24/2015 FINDINGS: Gallbladder: Surgically absent Common bile duct: Diameter: 5 mm Liver: Patent portal and hepatic veins with normal directional flow. No focal hepatic abnormality or biliary dilatation. Included views the right kidney demonstrate slight increased cortical echogenicity. No hydronephrosis. IMPRESSION: Prior cholecystectomy. No biliary dilatation No acute finding by ultrasound. Electronically Signed   By: Jerilynn Mages.  Shick M.D.   On: 01/25/2015 12:00    EKG:   Orders placed or performed during the hospital encounter of 01/24/15  . ED EKG  . ED EKG      Management plans discussed with the patient, family and they are in agreement.  CODE STATUS:     Code Status Orders        Start     Ordered   01/25/15 0205  Full code   Continuous     01/25/15 0204    Advance Directive Documentation        Most Recent Value   Type of Advance Directive  Living will   Pre-existing out of facility DNR order (yellow form or pink MOST form)     "MOST" Form in Place?        TOTAL TIME TAKING CARE OF THIS PATIENT: 45 minutes.    @[facility] @  on 01/27/2015 at 11:47 AM  Between 7am to 6pm - Pager - [phone]  After 6pm go to [email or web address] - password [email or web address] [facility]  [facility]  Office  [phone]  CC: Primary care physician; Adrian Prows, MD

## 2015-02-01 ENCOUNTER — Emergency Department (HOSPITAL_COMMUNITY): Payer: Medicare Other

## 2015-02-01 ENCOUNTER — Encounter (HOSPITAL_COMMUNITY): Payer: Self-pay

## 2015-02-01 ENCOUNTER — Inpatient Hospital Stay (HOSPITAL_COMMUNITY)
Admission: EM | Admit: 2015-02-01 | Discharge: 2015-02-03 | DRG: 066 | Disposition: A | Discharge status: Home / Self Care | Payer: Medicare Other | Attending: Neurology | Admitting: Neurology

## 2015-02-01 DIAGNOSIS — Z794 Long term (current) use of insulin: Secondary | ICD-10-CM

## 2015-02-01 DIAGNOSIS — I679 Cerebrovascular disease, unspecified: Secondary | ICD-10-CM | POA: Diagnosis not present

## 2015-02-01 DIAGNOSIS — I129 Hypertensive chronic kidney disease with stage 1 through stage 4 chronic kidney disease, or unspecified chronic kidney disease: Secondary | ICD-10-CM | POA: Diagnosis present

## 2015-02-01 DIAGNOSIS — Z79899 Other long term (current) drug therapy: Secondary | ICD-10-CM | POA: Diagnosis not present

## 2015-02-01 DIAGNOSIS — I619 Nontraumatic intracerebral hemorrhage, unspecified: Secondary | ICD-10-CM | POA: Diagnosis not present

## 2015-02-01 DIAGNOSIS — Z9842 Cataract extraction status, left eye: Secondary | ICD-10-CM | POA: Diagnosis not present

## 2015-02-01 DIAGNOSIS — I2510 Atherosclerotic heart disease of native coronary artery without angina pectoris: Secondary | ICD-10-CM | POA: Diagnosis present

## 2015-02-01 DIAGNOSIS — Z9841 Cataract extraction status, right eye: Secondary | ICD-10-CM | POA: Diagnosis not present

## 2015-02-01 DIAGNOSIS — E785 Hyperlipidemia, unspecified: Secondary | ICD-10-CM | POA: Diagnosis present

## 2015-02-01 DIAGNOSIS — I639 Cerebral infarction, unspecified: Secondary | ICD-10-CM

## 2015-02-01 DIAGNOSIS — E1122 Type 2 diabetes mellitus with diabetic chronic kidney disease: Secondary | ICD-10-CM | POA: Diagnosis present

## 2015-02-01 DIAGNOSIS — I610 Nontraumatic intracerebral hemorrhage in hemisphere, subcortical: Secondary | ICD-10-CM | POA: Diagnosis not present

## 2015-02-01 DIAGNOSIS — R200 Anesthesia of skin: Secondary | ICD-10-CM | POA: Diagnosis present

## 2015-02-01 DIAGNOSIS — Z7982 Long term (current) use of aspirin: Secondary | ICD-10-CM

## 2015-02-01 DIAGNOSIS — I10 Essential (primary) hypertension: Secondary | ICD-10-CM | POA: Diagnosis not present

## 2015-02-01 DIAGNOSIS — N183 Chronic kidney disease, stage 3 (moderate): Secondary | ICD-10-CM | POA: Diagnosis present

## 2015-02-01 DIAGNOSIS — Z955 Presence of coronary angioplasty implant and graft: Secondary | ICD-10-CM | POA: Diagnosis not present

## 2015-02-01 HISTORY — DX: Chronic kidney disease, unspecified: N18.9

## 2015-02-01 LAB — COMPREHENSIVE METABOLIC PANEL
ALT: 79 U/L — AB (ref 17–63)
ANION GAP: 11 (ref 5–15)
AST: 53 U/L — ABNORMAL HIGH (ref 15–41)
Albumin: 3.4 g/dL — ABNORMAL LOW (ref 3.5–5.0)
Alkaline Phosphatase: 101 U/L (ref 38–126)
BUN: 37 mg/dL — ABNORMAL HIGH (ref 6–20)
CHLORIDE: 112 mmol/L — AB (ref 101–111)
CO2: 18 mmol/L — ABNORMAL LOW (ref 22–32)
CREATININE: 4.47 mg/dL — AB (ref 0.61–1.24)
Calcium: 8.2 mg/dL — ABNORMAL LOW (ref 8.9–10.3)
GFR, EST AFRICAN AMERICAN: 14 mL/min — AB (ref >60)
GFR, EST NON AFRICAN AMERICAN: 12 mL/min — AB (ref >60)
Glucose, Bld: 128 mg/dL — ABNORMAL HIGH (ref 65–99)
Potassium: 4.3 mmol/L (ref 3.5–5.1)
SODIUM: 141 mmol/L (ref 135–145)
Total Bilirubin: 0.5 mg/dL (ref 0.3–1.2)
Total Protein: 6.8 g/dL (ref 6.5–8.1)

## 2015-02-01 LAB — CBC WITH DIFFERENTIAL/PLATELET
BASOS PCT: 1 %
Basophils Absolute: 0.1 10*3/uL (ref 0.0–0.1)
EOS ABS: 0.1 10*3/uL (ref 0.0–0.7)
Eosinophils Relative: 2 %
HEMATOCRIT: 35 % — AB (ref 39.0–52.0)
HEMOGLOBIN: 12.2 g/dL — AB (ref 13.0–17.0)
LYMPHS ABS: 1.2 10*3/uL (ref 0.7–4.0)
Lymphocytes Relative: 19 %
MCH: 31.7 pg (ref 26.0–34.0)
MCHC: 34.9 g/dL (ref 30.0–36.0)
MCV: 90.9 fL (ref 78.0–100.0)
Monocytes Absolute: 0.7 10*3/uL (ref 0.1–1.0)
Monocytes Relative: 11 %
NEUTROS ABS: 4.1 10*3/uL (ref 1.7–7.7)
NEUTROS PCT: 67 %
Platelets: 288 10*3/uL (ref 150–400)
RBC: 3.85 MIL/uL — AB (ref 4.22–5.81)
RDW: 12.4 % (ref 11.5–15.5)
WBC: 6.1 10*3/uL (ref 4.0–10.5)

## 2015-02-01 LAB — GLUCOSE, CAPILLARY
Glucose-Capillary: 190 mg/dL — ABNORMAL HIGH (ref 65–99)
Glucose-Capillary: 157 mg/dL — ABNORMAL HIGH (ref 65–99)

## 2015-02-01 LAB — CBG MONITORING, ED: GLUCOSE-CAPILLARY: 116 mg/dL — AB (ref 65–99)

## 2015-02-01 LAB — PROTIME-INR
INR: 1.02 (ref 0.00–1.49)
PROTHROMBIN TIME: 13.6 s (ref 11.6–15.2)

## 2015-02-01 MED ORDER — PANTOPRAZOLE SODIUM 40 MG IV SOLR
40.0000 mg | Freq: Every day | INTRAVENOUS | Status: DC
  Administered 2015-02-01 – 2015-02-02 (×2): 40 mg via INTRAVENOUS
  Filled 2015-02-01 (×2): qty 40

## 2015-02-01 MED ORDER — STROKE: EARLY STAGES OF RECOVERY BOOK
Freq: Once | Status: AC
Start: 2015-02-01 — End: 2015-02-01
  Administered 2015-02-01: 17:00:00

## 2015-02-01 MED ORDER — AMLODIPINE BESYLATE 10 MG PO TABS
10.0000 mg | ORAL_TABLET | Freq: Every day | ORAL | Status: DC
  Administered 2015-02-02 – 2015-02-03 (×2): 10 mg via ORAL
  Filled 2015-02-01 (×2): qty 1

## 2015-02-01 MED ORDER — CARVEDILOL 12.5 MG PO TABS
12.5000 mg | ORAL_TABLET | Freq: Two times a day (BID) | ORAL | Status: DC
  Administered 2015-02-01 – 2015-02-03 (×4): 12.5 mg via ORAL
  Filled 2015-02-01 (×4): qty 1

## 2015-02-01 MED ORDER — ROSUVASTATIN CALCIUM 10 MG PO TABS
10.0000 mg | ORAL_TABLET | Freq: Every day | ORAL | Status: DC
  Administered 2015-02-02 – 2015-02-03 (×2): 10 mg via ORAL
  Filled 2015-02-01 (×3): qty 1

## 2015-02-01 MED ORDER — SODIUM CHLORIDE 0.9 % IV SOLN
INTRAVENOUS | Status: DC
  Administered 2015-02-01: 21:00:00 via INTRAVENOUS

## 2015-02-01 MED ORDER — ACETAMINOPHEN 650 MG RE SUPP: 650.0000 mg | RECTAL | Status: DC | PRN

## 2015-02-01 MED ORDER — INSULIN GLARGINE 100 UNIT/ML ~~LOC~~ SOLN
11.0000 [IU] | Freq: Every day | SUBCUTANEOUS | Status: DC
  Administered 2015-02-01 – 2015-02-02 (×2): 11 [IU] via SUBCUTANEOUS
  Filled 2015-02-01 (×3): qty 0.11

## 2015-02-01 MED ORDER — ACETAMINOPHEN 325 MG PO TABS: 650.0000 mg | ORAL_TABLET | ORAL | Status: DC | PRN

## 2015-02-01 MED ORDER — LABETALOL HCL 5 MG/ML IV SOLN
5.0000 mg | INTRAVENOUS | Status: AC
  Administered 2015-02-01: 5 mg via INTRAVENOUS
  Filled 2015-02-01: qty 4

## 2015-02-01 MED ORDER — SENNOSIDES-DOCUSATE SODIUM 8.6-50 MG PO TABS
1.0000 | ORAL_TABLET | Freq: Two times a day (BID) | ORAL | Status: DC
  Administered 2015-02-01 – 2015-02-03 (×4): 1 via ORAL
  Filled 2015-02-01 (×4): qty 1

## 2015-02-01 MED ORDER — LABETALOL HCL 5 MG/ML IV SOLN
10.0000 mg | INTRAVENOUS | Status: DC | PRN
  Administered 2015-02-01: 10 mg via INTRAVENOUS
  Administered 2015-02-02: 20 mg via INTRAVENOUS
  Administered 2015-02-02: 15 mg via INTRAVENOUS
  Administered 2015-02-03 (×2): 20 mg via INTRAVENOUS
  Filled 2015-02-01 (×5): qty 4

## 2015-02-01 NOTE — ED Notes (Signed)
[facility]- pt reports he woke up at approximately 0200 this morning with right side numbness and tingling. Pt reports tingling in the right side of his face and down his arm and leg. Pt is a&o X4. Passed his stroke screen with EMS.

## 2015-02-01 NOTE — ED Notes (Signed)
Neurology at bedside.

## 2015-02-01 NOTE — ED Notes (Signed)
MD at bedside. 

## 2015-02-01 NOTE — ED Provider Notes (Signed)
CSN: [id number]     Arrival date & time 02/01/15  [id number] History   First MD Initiated Contact with Patient 02/01/15 [phone]     Chief Complaint  Patient presents with  . Stroke Symptoms     (Consider location/radiation/quality/duration/timing/severity/associated sxs/prior Treatment) HPI Comments: Patient is a 74 year old male with history of coronary artery disease with stents, insulin-dependent diabetes, and hypertension. He presents for evaluation of numbness of the right arm, right leg, and right side of his face. He went to bed last night at 11:30 feeling fine and woke up with these symptoms at approximately 2 AM. He denies any weakness, just a subjective feeling of numbness. He denies any prior history of CVA.  The history is provided by the patient.    Past Medical History  Diagnosis Date  . PONV (postoperative nausea and vomiting)   . Hyperlipidemia     takes Crestor daily  . Hypertension     takes Amlodipine and Metoprolol  . Coronary artery disease   . 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 Joint pain     knees,elbows,back  . Diabetes mellitus     lantus and novolog;fasting sugars run 70-120   Past Surgical History  Procedure Laterality Date  . Knee arthroscopy  80's    right knee  . Cholecystectomy  20+yrs ago  . Cardiac catheterization  01/2006    2 stents placed  . Back surgery  09/15/10  . Cataract extraction  12/21/10--02/01/11    right and left  . Elbow surgery  2012    right elbow  . Colonoscopy    . Ulnar nerve transposition  03/02/2011    Procedure: ULNAR NERVE DECOMPRESSION/TRANSPOSITION;  Surgeon: Peggyann Shoals, MD;  Location: [facility];  Service: Neurosurgery;  Laterality: Left;  LEFT ulnar nerve decompression  . Total hip arthroplasty     Family History  Problem Relation Age of Onset  . Anesthesia problems Neg Hx   . Hypotension Neg Hx   . Malignant hyperthermia Neg Hx   . Pseudochol deficiency Neg Hx    Social History  Substance Use Topics  .  Smoking status: Never Smoker   . Smokeless tobacco: None  . Alcohol Use: No    Review of Systems  All other systems reviewed and are negative.     Allergies  Valsartan and Vitamin d analogs  Home Medications   Prior to Admission medications   Medication Sig Start Date End Date Taking? Authorizing Provider  amLODipine (NORVASC) 5 MG tablet Take 10 mg by mouth daily after breakfast.     Historical Provider, MD  aspirin EC 81 MG tablet Take 81 mg by mouth daily.      Historical Provider, MD  carvedilol (COREG) 6.25 MG tablet Take 2 tablets (12.5 mg total) by mouth 2 (two) times daily with a meal. 01/27/15   Nicholes Mango, MD  carvedilol (COREG) 6.25 MG tablet Take 1 tablet (6.25 mg total) by mouth once. 01/27/15   Nicholes Mango, MD  cholecalciferol (VITAMIN D) 1000 UNITS tablet Take 1,000 Units by mouth daily.      Historical Provider, MD  ciprofloxacin (CIPRO) 250 MG tablet Take 1 tablet (250 mg total) by mouth 2 (two) times daily. 01/27/15 02/02/15  Nicholes Mango, MD  insulin glargine (LANTUS) 100 UNIT/ML injection Inject 11 Units into the skin at bedtime.     Historical Provider, MD  insulin lispro (HUMALOG) 100 UNIT/ML injection Inject 6 Units into the skin daily with supper.  Historical Provider, MD  loratadine (CLARITIN) 10 MG tablet Take 10 mg by mouth daily.    Historical Provider, MD  metroNIDAZOLE (FLAGYL) 500 MG tablet Take 1 tablet (500 mg total) by mouth every 8 (eight) hours. 01/27/15   Nicholes Mango, MD  oxyCODONE (OXY IR/ROXICODONE) 5 MG immediate release tablet Take 5-10 mg by mouth every 4 (four) hours as needed for severe pain.    Historical Provider, MD  sodium bicarbonate 650 MG tablet Take 1,300 mg by mouth 3 (three) times daily.      Historical Provider, MD  sodium polystyrene (KAYEXALATE) 15 GM/60ML suspension Take 15 g by mouth once.    Historical Provider, MD  traMADol (ULTRAM) 50 MG tablet Take 50 mg by mouth every 6 (six) hours as needed for moderate pain.    Historical  Provider, MD   BP 180/84 mmHg  Pulse 87  Temp(Src) 98 F (36.7 C) (Oral)  Resp 15  Ht 5\' 9"  (1.753 m)  Wt 204 lb (92.534 kg)  BMI 30.11 kg/m2  SpO2 98% Physical Exam  Constitutional: He is oriented to person, place, and time. He appears well-developed and well-nourished. No distress.  HENT:  Head: Normocephalic and atraumatic.  Eyes: EOM are normal. Pupils are equal, round, and reactive to light.  Neck: Normal range of motion. Neck supple.  Cardiovascular: Normal rate, regular rhythm and normal heart sounds.   No murmur heard. Pulmonary/Chest: Effort normal and breath sounds normal. No respiratory distress. He has no wheezes.  Abdominal: Soft. Bowel sounds are normal. He exhibits no distension. There is no tenderness.  Musculoskeletal: Normal range of motion. He exhibits no edema.  Lymphadenopathy:    He has no cervical adenopathy.  Neurological: He is alert and oriented to person, place, and time. No cranial nerve deficit. He exhibits normal muscle tone. Coordination normal.  Skin: Skin is warm and dry. He is not diaphoretic.  Nursing note and vitals reviewed.   ED Course  Procedures (including critical care time) Labs Review Labs Reviewed  COMPREHENSIVE METABOLIC PANEL  CBC WITH DIFFERENTIAL/PLATELET  PROTIME-INR    Imaging Review No results found. I have personally reviewed and evaluated these images and lab results as part of my medical decision-making.   EKG Interpretation   Date/Time:  Monday February 01 2015 08:37:21 EST Ventricular Rate:  88 PR Interval:  194 QRS Duration: 119 QT Interval:  401 QTC Calculation: 485 R Axis:   -50 Text Interpretation:  Sinus rhythm LAD, consider left anterior fascicular  block LVH with secondary repolarization abnormality Baseline wander in  lead(s) V2 V4 Confirmed by Ailany Koren  MD, Jenicka Coxe ([id number]) on 02/01/2015 9:17:29  AM      MDM   Final diagnoses:  None    Patient is a 74 year old male who presents with right arm,  leg, and facial numbness that started while asleep. His neurologic exam is nonfocal, however head CT scan does show a small acute thalamic hemorrhage. His blood pressure is presently 156/80 and no intervention is recommended per Dr. Leonel Ramsay. Dr. Leonel Ramsay will evaluate the patient in the ER and admit to the neuro ICU for observation and blood pressure monitoring.  CRITICAL CARE Performed by: Veryl Speak Total critical care time: 30 minutes Critical care time was exclusive of separately billable procedures and treating other patients. Critical care was necessary to treat or prevent imminent or life-threatening deterioration. Critical care was time spent personally by me on the following activities: development of treatment plan with patient and/or surrogate as well as nursing,  discussions with consultants, evaluation of patient's response to treatment, examination of patient, obtaining history from patient or surrogate, ordering and performing treatments and interventions, ordering and review of laboratory studies, ordering and review of radiographic studies, pulse oximetry and re-evaluation of patient's condition.     Veryl Speak, MD 02/01/15 1120

## 2015-02-01 NOTE — ED Notes (Signed)
Neuro PA and MD at bedside. Neuro MD advised he wanted to see if patient could ambulate, this RN stood patient up and assisted him to ambulated in his room per MD's request. Pt was very unsteady on his feet. Pt helped back to the stretcher, placed back on cardiac monitor, and pulse ox.

## 2015-02-01 NOTE — H&P (Signed)
H&P    Chief Complaint: ICH  HPI:                                                                                                                                         Todd [name] is an 74 y.o. male with history of coronary artery disease with stents, insulin-dependent diabetes, and hypertension. He presents for evaluation of numbness of the right arm, right leg, and right side of his face. He went to bed last night at 11:30 feeling fine and woke up with these symptoms at approximately 2 AM. HE was brought to hospital due to the numbness on his right side.  Currently he is under no distress. CT head shows a thalamic ICH.   Date last known well: Date: 01/31/2015 Time last known well: Time: 23:30  tPA Given: No: ICH Modified Rankin: Rankin Score=0    Past Medical History  Diagnosis Date  . PONV (postoperative nausea and vomiting)   . Hyperlipidemia     takes Crestor daily  . Hypertension     takes Amlodipine and Metoprolol  . Coronary artery disease   . 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 Joint pain     knees,elbows,back  . Diabetes mellitus     lantus and novolog;fasting sugars run 70-120  . CKD (chronic kidney disease)     Past Surgical History  Procedure Laterality Date  . Knee arthroscopy  80's    right knee  . Cholecystectomy  20+yrs ago  . Cardiac catheterization  01/2006    2 stents placed  . Back surgery  09/15/10  . Cataract extraction  12/21/10--02/01/11    right and left  . Elbow surgery  2012    right elbow  . Colonoscopy    . Ulnar nerve transposition  03/02/2011    Procedure: ULNAR NERVE DECOMPRESSION/TRANSPOSITION;  Surgeon: Peggyann Shoals, MD;  Location: [facility];  Service: Neurosurgery;  Laterality: Left;  LEFT ulnar nerve decompression  . Total hip arthroplasty      Family History  Problem Relation Age of Onset  . Anesthesia problems Neg Hx   . Hypotension Neg Hx   . Malignant hyperthermia Neg Hx   . Pseudochol deficiency Neg Hx     Social History:  reports that he has never smoked. He does not have any smokeless tobacco history on file. He reports that he does not drink alcohol or use illicit drugs.  Allergies:  Allergies  Allergen Reactions  . Valsartan Other (See Comments)    Unsure of symptoms, was on list from previous hospital and patient was unsure of name of medicine.  . Vitamin D Analogs Other (See Comments)    Bloating and swelling in abdomen region, indigestion. Prescription vit d not otc    Medications:  No current facility-administered medications for this encounter.   Current Outpatient Prescriptions  Medication Sig Dispense Refill  . amLODipine (NORVASC) 5 MG tablet Take 10 mg by mouth daily after breakfast.     . aspirin EC 81 MG tablet Take 81 mg by mouth daily.      . carvedilol (COREG) 6.25 MG tablet Take 2 tablets (12.5 mg total) by mouth 2 (two) times daily with a meal. 60 tablet 0  . cholecalciferol (VITAMIN D) 1000 UNITS tablet Take 1,000 Units by mouth daily.      . ciprofloxacin (CIPRO) 250 MG tablet Take 1 tablet (250 mg total) by mouth 2 (two) times daily. 7 tablet 0  . insulin glargine (LANTUS) 100 UNIT/ML injection Inject 11 Units into the skin at bedtime.     . insulin lispro (HUMALOG) 100 UNIT/ML injection Inject 6 Units into the skin daily with supper.    . metroNIDAZOLE (FLAGYL) 500 MG tablet Take 1 tablet (500 mg total) by mouth every 8 (eight) hours. 21 tablet 0  . rosuvastatin (CRESTOR) 10 MG tablet Take 10 mg by mouth daily.    . sodium bicarbonate 650 MG tablet Take 1,300 mg by mouth 3 (three) times daily.      . traMADol (ULTRAM) 50 MG tablet Take 50 mg by mouth every 6 (six) hours as needed for moderate pain.    . carvedilol (COREG) 6.25 MG tablet Take 1 tablet (6.25 mg total) by mouth once. 1 tablet 0     ROS:                                                                                                                                        History obtained from the patient  General ROS: negative for - chills, fatigue, fever, night sweats, weight gain or weight loss Psychological ROS: negative for - behavioral disorder, hallucinations, memory difficulties, mood swings or suicidal ideation Ophthalmic ROS: negative for - blurry vision, double vision, eye pain or loss of vision ENT ROS: negative for - epistaxis, nasal discharge, oral lesions, sore throat, tinnitus or vertigo Allergy and Immunology ROS: negative for - hives or itchy/watery eyes Hematological and Lymphatic ROS: negative for - bleeding problems, bruising or swollen lymph nodes Endocrine ROS: negative for - galactorrhea, hair pattern changes, polydipsia/polyuria or temperature intolerance Respiratory ROS: negative for - cough, hemoptysis, shortness of breath or wheezing Cardiovascular ROS: negative for - chest pain, dyspnea on exertion, edema or irregular heartbeat Gastrointestinal ROS: negative for - abdominal pain, diarrhea, hematemesis, nausea/vomiting or stool incontinence Genito-Urinary ROS: negative for - dysuria, hematuria, incontinence or urinary frequency/urgency Musculoskeletal ROS: negative for - joint swelling or muscular weakness Neurological ROS: as noted in HPI Dermatological ROS: negative for rash and skin lesion changes  Neurologic Examination:  Blood pressure 159/91, pulse 89, temperature 98 F (36.7 C), temperature source Oral, resp. rate 10, height 5\' 9"  (1.753 m), weight 92.534 kg (204 lb), SpO2 98 %.  HEENT-  Normocephalic, no lesions, without obvious abnormality.  Normal external eye and conjunctiva.  Normal TM's bilaterally.  Normal auditory canals and external ears. Normal external nose, mucus membranes and septum.  Normal pharynx. Cardiovascular- S1, S2  normal, pulses palpable throughout   Lungs- chest clear, no wheezing, rales, normal symmetric air entry Abdomen- normal findings: bowel sounds normal Extremities- no edema Lymph-no adenopathy palpable Musculoskeletal-no joint tenderness, deformity or swelling Skin-warm and dry, no hyperpigmentation, vitiligo, or suspicious lesions  Neurological Examination Mental Status: Alert, oriented, thought content appropriate.  Speech fluent without evidence of aphasia.  Able to follow 3 step commands without difficulty. Cranial Nerves: II:   Visual fields grossly normal, pupils equal, round, reactive to light and accommodation III,IV, VI: ptosis not present, extra-ocular motions intact bilaterally V,VII: smile symmetric, facial light touch sensation normal bilaterally VIII: hearing normal bilaterally IX,X: uvula rises symmetrically XI: bilateral shoulder shrug XII: midline tongue extension Motor: Right : Upper extremity   5/5    Left:     Upper extremity   5/5  Lower extremity   5/5     Lower extremity   5/5 Tone and bulk:normal tone throughout; no atrophy noted Sensory: Pinprick and light touch decreased on the right face, arm and leg (proximally 50% less than left).  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  Deep Tendon Reflexes: 2+ and symmetric throughout Plantars: Right: downgoing   Left: downgoing Cerebellar: normal finger-to-nose, normal rapid alternating movements and normal heel-to-shin test Gait: moderate to sever gait ataxia worse than baseline.         Lab Results: Basic Metabolic Panel:  Recent Labs Lab 01/26/15 0552 01/27/15 02/01/15 0917  NA 146* 145 141  K 4.7 4.5 4.3  CL 118* 113* 112*  CO2 25 24 18*  GLUCOSE 138* 157* 128*  BUN 37* 38* 37*  CREATININE 3.58* 3.69* 4.47*  CALCIUM 8.0* 8.1* 8.2*    Liver Function Tests:  Recent Labs Lab 01/27/15 02/01/15 0917  AST 52* 53*  ALT 119* 79*  ALKPHOS 91 101  BILITOT 0.4 0.5  PROT 6.0* 6.8  ALBUMIN  2.9* 3.4*   No results for input(s): LIPASE, AMYLASE in the last 168 hours. No results for input(s): AMMONIA in the last 168 hours.  CBC:  Recent Labs Lab 02/01/15 0917  WBC 6.1  NEUTROABS 4.1  HGB 12.2*  HCT 35.0*  MCV 90.9  PLT 288    Cardiac Enzymes: No results for input(s): CKTOTAL, CKMB, CKMBINDEX, TROPONINI in the last 168 hours.  Lipid Panel: No results for input(s): CHOL, TRIG, HDL, CHOLHDL, VLDL, LDLCALC in the last 168 hours.  CBG:  Recent Labs Lab 01/26/15 1631 01/26/15 2113 01/27/15 0739 01/27/15 1121 02/01/15 0916  GLUCAP 200* 175* 132* 185* 116*    Microbiology: Results for orders placed or performed in visit on 09/04/11  MRSA PCR Screening     Status: None   Collection Time: 09/04/11  2:20 PM  Result Value Ref Range Status   Micro Text Report   Final       COMMENT                   NEGATIVE - MRSA target DNA not detected   ANTIBIOTIC  Urine culture     Status: None   Collection Time: 09/04/11  2:46 PM  Result Value Ref Range Status   Micro Text Report   Final       SOURCE: CC    COMMENT                   NO GROWTH IN 36 HOURS   ANTIBIOTIC                                                        Coagulation Studies:  Recent Labs  02/01/15 0917  LABPROT 13.6  INR 1.02    Imaging: Ct Head Wo Contrast  02/01/2015  CLINICAL DATA:  Right side numbness beginning last night. Headache and neck stiffness this morning. Initial encounter. EXAM: CT HEAD WITHOUT CONTRAST TECHNIQUE: Contiguous axial images were obtained from the base of the skull through the vertex without intravenous contrast. COMPARISON:  Head CT scan 07/20/2010. FINDINGS: Focal hemorrhage is seen in the left thalamus measuring 0.9 cm transverse by 0.4 cm AP with some surrounding hypoattenuation. Calcifications in the basal ganglia and cerebellar peduncles are unchanged. Atrophy and chronic microvascular ischemic change are  also unchanged. No midline shift, hydrocephalus or abnormal extra-axial fluid collection is seen. Mild mucosal thickening is seen in the right maxillary sinus. Partial visualization of a mucous retention cyst or polyp in the right maxillary sinus also noted. IMPRESSION: Small, acute appearing hemorrhage in the left thalamus is likely hypertensive in nature. Atrophy and chronic microvascular ischemic change. Critical Value/emergent results were called by telephone at the time of interpretation on 02/01/2015 at 11:07 am to Dr. Veryl Speak , who verbally acknowledged these results. Electronically Signed   By: Inge Rise M.D.   On: 02/01/2015 11:09       Assessment and plan discussed with with attending physician and they are in agreement.    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  Exam only notable for right sensory deficit. Currently he is not significantly hypertensive and able to be managed with IV Labetalol.    Stroke Risk Factors - diabetes mellitus, hyperlipidemia and hypertension   Plan: 1) admit to [facility]M due to stable hemorrhage and stable BP 2) ICH work up 3) Tele 4) renal consultation  5) PT/OT 6) Q shift neuro check

## 2015-02-02 ENCOUNTER — Inpatient Hospital Stay (HOSPITAL_COMMUNITY): Payer: Medicare Other

## 2015-02-02 DIAGNOSIS — I679 Cerebrovascular disease, unspecified: Secondary | ICD-10-CM

## 2015-02-02 DIAGNOSIS — I610 Nontraumatic intracerebral hemorrhage in hemisphere, subcortical: Secondary | ICD-10-CM | POA: Insufficient documentation

## 2015-02-02 DIAGNOSIS — I10 Essential (primary) hypertension: Secondary | ICD-10-CM

## 2015-02-02 LAB — GLUCOSE, CAPILLARY
GLUCOSE-CAPILLARY: 186 mg/dL — AB (ref 65–99)
GLUCOSE-CAPILLARY: 285 mg/dL — AB (ref 65–99)
Glucose-Capillary: 154 mg/dL — ABNORMAL HIGH (ref 65–99)
Glucose-Capillary: 206 mg/dL — ABNORMAL HIGH (ref 65–99)

## 2015-02-02 LAB — LIPID PANEL
CHOL/HDL RATIO: 3.5 ratio
CHOLESTEROL: 109 mg/dL (ref 0–200)
HDL: 31 mg/dL — ABNORMAL LOW (ref >40)
LDL Cholesterol: 42 mg/dL (ref 0–99)
Triglycerides: 181 mg/dL — ABNORMAL HIGH (ref <150)
VLDL: 36 mg/dL (ref 0–40)

## 2015-02-02 MED ORDER — SODIUM BICARBONATE 650 MG PO TABS
1300.0000 mg | ORAL_TABLET | Freq: Three times a day (TID) | ORAL | Status: DC
  Administered 2015-02-02 – 2015-02-03 (×3): 1300 mg via ORAL
  Filled 2015-02-02 (×3): qty 2

## 2015-02-02 MED ORDER — INSULIN ASPART 100 UNIT/ML ~~LOC~~ SOLN
0.0000 [IU] | Freq: Three times a day (TID) | SUBCUTANEOUS | Status: DC
  Administered 2015-02-02: 5 [IU] via SUBCUTANEOUS
  Administered 2015-02-03 (×2): 2 [IU] via SUBCUTANEOUS

## 2015-02-02 NOTE — Progress Notes (Signed)
PT Cancellation Note  Patient Details Name: Todd [name] MRN: [id number] DOB: January 04, 1941   Cancelled Treatment:    Reason Eval/Treat Not Completed: Patient not medically ready. Pt remains on bedrest. Will see for PT eval once activity status increased.   Embry Huss 02/02/2015, 8:15 AM  Pager [phone]

## 2015-02-02 NOTE — Care Management Note (Signed)
Case Management Note  Patient Details  Name: Todd [name] MRN: [id number] Date of Birth: [birth year]  Subjective/Objective:                    Action/Plan: Patient admitted with ICH. Patient is from home with his wife. Awaiting PT/OT recommendations for discharge disposition. CM will continue to follow for discharge needs.  Expected Discharge Date:                  Expected Discharge Plan:     In-House Referral:     Discharge planning Services     Post Acute Care Choice:    Choice offered to:     DME Arranged:    DME Agency:     HH Arranged:    HH Agency:     Status of Service:  In process, will continue to follow  Medicare Important Message Given:    Date Medicare IM Given:    Medicare IM give by:    Date Additional Medicare IM Given:    Additional Medicare Important Message give by:     If discussed at [place] of Stay Meetings, dates discussed:    Additional Comments:  Pollie Friar, RN 02/02/2015, 9:54 AM

## 2015-02-02 NOTE — Progress Notes (Signed)
PT Cancellation Note  Patient Details Name: Todd [name] MRN: [id number] DOB: Sep 27, 1940   Cancelled Treatment:    Reason Eval/Treat Not Completed: Patient at procedure or test/unavailable   [name] 02/02/2015, 1:57 PM Pager [phone]

## 2015-02-02 NOTE — Evaluation (Signed)
Speech Language Pathology Evaluation Patient Details Name: Todd [name] MRN: [id number] DOB: 1940-05-06 Today's Date: 02/02/2015 Time: [id number] SLP Time Calculation (min) (ACUTE ONLY): 17 min  Problem List:  Patient Active Problem List   Diagnosis Date Noted  . ICH (intracerebral hemorrhage) ([place]) 02/01/2015  . Abdominal pain 01/25/2015  . Acute hepatitis   . Enteritis   . Ileus ([place])   . Uncontrollable vomiting    Past Medical History:  Past Medical History  Diagnosis Date  . PONV (postoperative nausea and vomiting)   . Hyperlipidemia     takes Crestor daily  . Hypertension     takes Amlodipine and Metoprolol  . Coronary artery disease   . 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 Joint pain     knees,elbows,back  . Diabetes mellitus     lantus and novolog;fasting sugars run 70-120  . CKD (chronic kidney disease)    Past Surgical History:  Past Surgical History  Procedure Laterality Date  . Knee arthroscopy  80's    right knee  . Cholecystectomy  20+yrs ago  . Cardiac catheterization  01/2006    2 stents placed  . Back surgery  09/15/10  . Cataract extraction  12/21/10--02/01/11    right and left  . Elbow surgery  2012    right elbow  . Colonoscopy    . Ulnar nerve transposition  03/02/2011    Procedure: ULNAR NERVE DECOMPRESSION/TRANSPOSITION;  Surgeon: Peggyann Shoals, MD;  Location: [facility];  Service: Neurosurgery;  Laterality: Left;  LEFT ulnar nerve decompression  . Total hip arthroplasty     HPI:  Todd [name] is an 74 y.o. male with history of coronary artery disease with stents, insulin-dependent diabetes, and hypertension. He presents for evaluation of numbness of the right arm, right leg, and right side of his face. He went to bed last night at 11:30 feeling fine and woke up with these symptoms at approximately 2 AM. HE was brought to hospital due to the numbness on his right side. Currently he is under no distress. CT head shows a thalamic ICH.     Assessment / Plan / Recommendation Clinical Impression  Cognitive-linguistic evaluation complete.  Patient completed the [facility] Cognitive Assessment [facility]) and demonstrated skills consistent with a normal range score with an extra repetition for storage and slightly increased time for recall of all 5 words following the delay.  Speech is fully intelligible and expressive and receptive language is intact.  Education complete and no further skilled SLP services are warranted at this time; patient and wife in agreement.      SLP Assessment  Patient does not need any further Speech Lanaguage Pathology Services    Follow Up Recommendations  None               SLP Evaluation Prior Functioning  Cognitive/Linguistic Baseline: Within functional limits  Lives With: Spouse Vocation: Self employed   Cognition  Overall Cognitive Status: Within Functional Limits for tasks assessed Arousal/Alertness: Awake/alert Orientation Level: Oriented X4 Safety/Judgment: Appears intact    Comprehension  Auditory Comprehension Overall Auditory Comprehension: Appears within functional limits for tasks assessed Visual Recognition/Discrimination Discrimination: Within Function Limits Reading Comprehension Reading Status: Within funtional limits    Expression Expression Primary Mode of Expression: Verbal Verbal Expression Overall Verbal Expression: Appears within functional limits for tasks assessed Written Expression Written Expression: Not tested   Oral / Motor Oral Motor/Sensory Function Overall Oral Motor/Sensory Function: Within functional limits Motor Speech Overall  Motor Speech: Appears within functional limits for tasks assessed   Todd [name]., CCC-SLP [id number]  [place] 02/02/2015, 10:58 AM

## 2015-02-02 NOTE — Progress Notes (Signed)
SLP Cancellation Note  Patient Details Name: ALPHONZO SAAH MRN: [id number] DOB: 08-05-1940   Cancelled treatment:       Reason Eval/Treat Not Completed: Patient at procedure or test/unavailable.  sLP will follow up as able.  Gunnar Fusi, M.A., CCC-SLP [phone]  [place] 02/02/2015, 10:00 AM

## 2015-02-02 NOTE — Progress Notes (Signed)
  Echocardiogram 2D Echocardiogram has been performed.  Todd [name] 02/02/2015, 2:07 PM

## 2015-02-02 NOTE — Evaluation (Signed)
Occupational Therapy Evaluation Patient Details Name: Todd [name] MRN: [id number] DOB: [birth year] Today's Date: 02/02/2015    History of Present Illness Adm with Rt sided numbness and tingling; CT showed LEFT thalamic ICH  PMHx- THR, Elbow surgery with nerve transposition, CAD, DM   Clinical Impression   Pt admitted with above. He demonstrates the below listed deficits and will benefit from continued OT to maximize safety and independence with BADLs.  Pt demonstrates impaired coordination and sensation Rt UE (non dominant UE).  He requires min A for ADLs.  Will follow acutely.  Recommend OPOT>       Follow Up Recommendations  Outpatient OT;Supervision/Assistance - 24 hour    Equipment Recommendations  None recommended by OT    Recommendations for Other Services       Precautions / Restrictions Precautions Precautions: Fall      Mobility Bed Mobility                  Transfers Overall transfer level: Needs assistance Equipment used: None Transfers: Sit to/from [facility] Sit to Stand: Min assist Stand pivot transfers: Min assist       General transfer comment: Pt stabilizes back of legs against chair. Is unstedy when support is withdrawn.      Balance Overall balance assessment: Needs assistance Sitting-balance support: Feet supported Sitting balance-Leahy Scale: Good     Standing balance support: During functional activity Standing balance-Leahy Scale: Fair Standing balance comment: Pt able to reach within BOS without LOB and simulated shower in standing reaching to knees without support, but when challenged further requires min A for balance                             ADL Overall ADL's : Needs assistance/impaired Eating/Feeding: Modified independent;Sitting   Grooming: Wash/dry hands;Wash/dry face;Oral care;Brushing hair   Upper Body Bathing: Set up;Sitting   Lower Body Bathing: Minimal assistance;Sit to/from  stand Lower Body Bathing Details (indicate cue type and reason): simulated sit to stand Upper Body Dressing : Set up;Sitting   Lower Body Dressing: Minimal assistance   Toilet Transfer: Minimal assistance;Ambulation;Comfort height toilet;Grab bars   Toileting- Clothing Manipulation and Hygiene: Minimal assistance;Sit to/from stand       Functional mobility during ADLs: Minimal assistance General ADL Comments: Pt requires min A for balance and fasteners. Lengthy discussion with pt and family re: deficits and impact on function, specifically work related tasks and his ability to accurately key information with Rt hand      Vision Additional Comments: vision not formally tested.  Pt denies deficits.  Instructed him to read newspaper this pm and to compare to his normal.     Perception Perception Perception Tested?: Yes   Praxis Praxis Praxis tested?: Within functional limits    Pertinent Vitals/Pain Pain Assessment: No/denies pain     Hand Dominance Left   Extremity/Trunk Assessment Upper Extremity Assessment Upper Extremity Assessment: RUE deficits/detail RUE Deficits / Details: strength 5/5; impaired coordination - pt uses more force than necessary when reaching for objects, using Rt UE.  Proprioception appears intact per formal testing, however, appears diminished during functional tasks.   Demonstrates difficulty tying gown  RUE Sensation: decreased light touch;decreased proprioception RUE Coordination: decreased fine motor   Lower Extremity Assessment Lower Extremity Assessment: Defer to PT evaluation RLE Deficits / Details: strength 5/5 RLE Sensation: decreased light touch;decreased proprioception RLE Coordination: decreased gross motor   Cervical / Trunk Assessment  Cervical / Trunk Assessment: Other exceptions Cervical / Trunk Exceptions: almost ataxic trunk movements during sit to stand (x 2)   Communication Communication Communication: HOH   Cognition  Arousal/Alertness: Awake/alert Behavior During Therapy: WFL for tasks assessed/performed Overall Cognitive Status: Impaired/Different from baseline Area of Impairment: Awareness           Awareness: Intellectual   General Comments: Pt iniitally denied coordination deficits with Rt UE, only acknoleging sensory loss.  It wasn't until he was challenged with a [facility] acitivity that should have been easy that he acknowleged difficulty.  When asked how he walked with PT, if he had any difficulty, pt stated "I think I did pretty well".  Upon reading PT notes, pt required min - mod A    General Comments       Exercises       Shoulder Instructions      Home Living Family/patient expects to be discharged to:: Private residence Living Arrangements: Spouse/significant other Available Help at Discharge: Available 24 hours/day Type of Home: House Home Access: Stairs to enter [employer] of Steps: 4 Entrance Stairs-Rails: Left Home Layout: Multi-level;Able to live on main level with bedroom/bathroom     Bathroom Shower/Tub: Occupational psychologist: Standard     Home Equipment: Walker - standard;Shower seat - built in          Prior Functioning/Environment Level of Independence: Independent        Comments: Pt works full time as an Optometrist, goes to cardiac rehab 3x/week     OT Diagnosis: Generalized weakness;Ataxia (mild deficits )   OT Problem List: Decreased strength;Impaired balance (sitting and/or standing);Decreased coordination;Impaired vision/perception;Decreased safety awareness;Decreased knowledge of use of DME or AE;Impaired UE functional use   OT Treatment/Interventions: Self-care/ADL training;Neuromuscular education;Therapeutic activities;Visual/perceptual remediation/compensation;Patient/family education;Balance training    OT Goals(Current goals can be found in the care plan section) Acute Rehab OT Goals Patient Stated Goal: to go back to  work  OT Goal Formulation: With patient Time For Goal Achievement: 02/16/15 Potential to Achieve Goals: Good ADL Goals Pt Will Perform Lower Body Bathing: with supervision;sit to/from stand Pt Will Perform Lower Body Dressing: with supervision;sit to/from stand Pt Will Transfer to Toilet: with supervision;ambulating;regular height toilet;bedside commode;grab bars Pt Will Perform Toileting - Clothing Manipulation and hygiene: with supervision;sit to/from stand Pt Will Perform Tub/Shower Transfer: Shower transfer;with supervision;ambulating;shower seat Additional ADL Goal #1: Pt will be independent with HEP for Rt UE Additional ADL Goal #2: Pt will demonstrates anticipatory awareness of deficits independently   OT Frequency: Min 2X/week   Barriers to D/C:            Co-evaluation              End of Session Nurse Communication: Mobility status  Activity Tolerance: Patient tolerated treatment well Patient left: in chair;with call bell/phone within reach;with chair alarm set;with family/visitor present   Time: [id number] OT Time Calculation (min): 29 min Charges:  OT General Charges $OT Visit: 1 Procedure OT Evaluation $Initial OT Evaluation Tier I: 1 Procedure OT Treatments $Therapeutic Activity: 8-22 mins G-Codes:    Kyheem Bathgate M 20-Feb-2015, 6:14 PM

## 2015-02-02 NOTE — Evaluation (Signed)
Physical Therapy Evaluation Patient Details Name: Todd [name] MRN: [id number] DOB: 07-08-1940 Today's Date: 02/02/2015   History of Present Illness  Adm with Rt sided numbness and tingling; CT showed LEFT thalamic ICH  PMHx- THR, Elbow surgery with nerve transposition, CAD, DM  Clinical Impression  Pt admitted with above diagnosis. Pt very unsteady initially with improvement during session (?partially anxiety vs first time ambulating since admission). Pt currently with functional limitations due to the deficits listed below (see PT Problem List).  Pt will benefit from skilled PT to increase their independence and safety with mobility to allow discharge to the venue listed below.       Follow Up Recommendations Home health PT (with progression to OPPT)    Equipment Recommendations  Other (comment) (TBA (?RW))    Recommendations for Other Services OT consult     Precautions / Restrictions Precautions Precautions: Fall      Mobility  Bed Mobility                  Transfers Overall transfer level: Needs assistance Equipment used: None Transfers: Sit to/from Stand Sit to Stand: Mod assist         General transfer comment: pushes calves against chair to steady himself as coming to stand with unsteady trunk movements (appears ataxic-like)  Ambulation/Gait Ambulation/Gait assistance: Mod assist;Min assist Ambulation Distance (Feet): 140 Feet Assistive device: 1 person hand held assist;None Gait Pattern/deviations: Step-through pattern;Decreased stride length;Wide base of support   Gait velocity interpretation: Below normal speed for age/gender General Gait Details: very guarded initially with strong lean on PT's UE for support; progressed to no UE support, however his RUE draws into flexion while walking (high guard position); LUE does not  Stairs            Wheelchair Mobility    Modified Rankin (Stroke Patients Only) Modified Rankin (Stroke Patients  Only) Pre-Morbid Rankin Score: No symptoms Modified Rankin: Moderately severe disability     Balance Overall balance assessment: Needs assistance         Standing balance support: No upper extremity supported Standing balance-Leahy Scale: Fair Standing balance comment: after ambulation                             Pertinent Vitals/Pain Pain Assessment: No/denies pain    Home Living Family/patient expects to be discharged to:: Private residence Living Arrangements: Spouse/significant other Available Help at Discharge: Available 24 hours/day Type of Home: House Home Access: Stairs to enter Entrance Stairs-Rails: Left Entrance Stairs-Number of Steps: 4 Home Layout: Multi-level;Able to live on main level with bedroom/bathroom Home Equipment: Walker - standard;Shower seat - built in      Prior Function Level of Independence: Independent         Comments: goes to cardiac rehab 3x/wk     Hand Dominance   Dominant Hand: Left    Extremity/Trunk Assessment   Upper Extremity Assessment: Defer to OT evaluation           Lower Extremity Assessment: RLE deficits/detail RLE Deficits / Details: strength 5/5    Cervical / Trunk Assessment: Other exceptions  Communication   Communication: HOH  Cognition Arousal/Alertness: Awake/alert Behavior During Therapy: WFL for tasks assessed/performed Overall Cognitive Status: Within Functional Limits for tasks assessed                      General Comments      Exercises  Assessment/Plan    PT Assessment Patient needs continued PT services  PT Diagnosis Abnormality of gait   PT Problem List Decreased activity tolerance;Decreased balance;Decreased mobility;Decreased knowledge of use of DME;Decreased coordination;Impaired sensation  PT Treatment Interventions DME instruction;Gait training;Stair training;Functional mobility training;Therapeutic activities;Balance training;Neuromuscular  re-education;Patient/family education   PT Goals (Current goals can be found in the Care Plan section) Acute Rehab PT Goals Patient Stated Goal: walk without device PT Goal Formulation: With patient Time For Goal Achievement: 02/09/15 Potential to Achieve Goals: Good    Frequency Min 4X/week   Barriers to discharge        Co-evaluation               End of Session Equipment Utilized During Treatment: Gait belt Activity Tolerance: Patient limited by fatigue Patient left: in chair;with call bell/phone within reach Nurse Communication: Mobility status         Time: [id number] PT Time Calculation (min) (ACUTE ONLY): 22 min   Charges:   PT Evaluation $Initial PT Evaluation Tier I: 1 Procedure     PT G Codes:        Jenafer Winterton 02-04-15, 5:02 PM Pager [phone]

## 2015-02-02 NOTE — Progress Notes (Signed)
Utilization review completed. Nichola Warren, RN, BSN. 

## 2015-02-02 NOTE — Progress Notes (Signed)
STROKE TEAM PROGRESS NOTE   HISTORY Todd [name] is an 74 y.o. male with history of coronary artery disease with stents, insulin-dependent diabetes, and hypertension. He presents for evaluation of numbness of the right arm, right leg, and right side of his face. He went to bed last night at 11:30 feeling fine and woke up with these symptoms at approximately 2 AM. He was brought to hospital due to the numbness on his right side. Currently he is under no distress. CT head shows a LEFT thalamic ICH.   Patient was not administered TPA secondary to [place].   SUBJECTIVE (INTERVAL HISTORY) His wife is at the bedside. Overall he feels his condition is gradually improving. Still with right-sided sensory deficits and mild coordination deficits.    OBJECTIVE Temp:  [97.5 F (36.4 C)-98.6 F (37 C)] 97.5 F (36.4 C) (11/15 1011) Pulse Rate:  [70-95] 70 (11/15 1011) Cardiac Rhythm:  [-]  Resp:  [10-18] 18 (11/15 1011) BP: (146-179)/(73-94) 165/73 mmHg (11/15 1011) SpO2:  [95 %-100 %] 100 % (11/15 1011)  CBC:  Recent Labs Lab 02/01/15 0917  WBC 6.1  NEUTROABS 4.1  HGB 12.2*  HCT 35.0*  MCV 90.9  PLT [id number]    Basic Metabolic Panel:  Recent Labs Lab 01/27/15 02/01/15 0917  NA 145 141  K 4.5 4.3  CL 113* 112*  CO2 24 18*  GLUCOSE 157* 128*  BUN 38* 37*  CREATININE 3.69* 4.47*  CALCIUM 8.1* 8.2*    HgbA1c:  Lab Results  Component Value Date   HGBA1C 6.1* 01/25/2015    IMAGING  Ct Head Wo Contrast  02/01/2015  CLINICAL DATA:  Right side numbness beginning last night. Headache and neck stiffness this morning. Initial encounter. EXAM: CT HEAD WITHOUT CONTRAST TECHNIQUE: Contiguous axial images were obtained from the base of the skull through the vertex without intravenous contrast. COMPARISON:  Head CT scan 07/20/2010. FINDINGS: Focal hemorrhage is seen in the left thalamus measuring 0.9 cm transverse by 0.4 cm AP with some surrounding hypoattenuation. Calcifications in the basal  ganglia and cerebellar peduncles are unchanged. Atrophy and chronic microvascular ischemic change are also unchanged. No midline shift, hydrocephalus or abnormal extra-axial fluid collection is seen. Mild mucosal thickening is seen in the right maxillary sinus. Partial visualization of a mucous retention cyst or polyp in the right maxillary sinus also noted. IMPRESSION: Small, acute appearing hemorrhage in the left thalamus is likely hypertensive in nature. Atrophy and chronic microvascular ischemic change. Critical Value/emergent results were called by telephone at the time of interpretation on 02/01/2015 at 11:07 am to Dr. Veryl Speak , who verbally acknowledged these results. Electronically Signed   By: Inge Rise M.D.   On: 02/01/2015 11:09    PHYSICAL EXAM  General - Well nourished, well developed, in no apparent distress.  Cardiovascular - Regular rate and rhythm with no murmur.  Mental Status -  Level of arousal and orientation to time, place, and person were intact. Language including expression, naming, repetition, comprehension was assessed and found intact. Attention span and concentration were normal. Recent and remote memory were intact. Fund of Knowledge was assessed and was intact.  Cranial Nerves II - XII - II - Visual field intact. III, IV, VI - Extraocular movements intact. V - Facial sensation slightly impaired on the right. VII - Facial movement intact bilaterally. VIII - Hearing & vestibular intact bilaterally. X - Palate elevates symmetrically. XI - Chin turning & shoulder shrug intact bilaterally. XII - Tongue protrusion intact.  Motor Strength - The patient's strength was 4+/5 in the right extremities, otherwise normal, pronator drift was absent.  Bulk was normal and fasciculations were absent.   Motor Tone - Muscle tone was assessed at the neck and appendages and was normal.  Reflexes - The patient's reflexes were 1+ in all extremities and he had no  pathological reflexes.  Sensory - Decreased sensation on the right face, upper, and lower extremities.   Coordination - Slightly impaired coordination on the right.   Gait and Station - Deferred  ASSESSMENT/PLAN Todd [name] is a 74 y.o. 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   CT head: Small, acute appearing hemorrhage in the left thalamus is likely hypertensive in nature.  Carotid Doppler  Pending  2D Echo  Pending  LDL pending  HgbA1c 6.1  SCD'sfor VTE prophylaxis  Diet Carb Modified Fluid consistency:: Thin; Room service appropriate?: Yes  aspirin 81 mg daily prior to admission, now on No antithrombotic given ICH  Therapy recommendations:  Pending; changed to activity as tolerated  Disposition:  Pending; likely home   Acute on CKD  Cr mildly elevated from baseline. No electrolyte abnormalities. Mild acidosis.   Restart HCO3 1300 mg tid  Repeat BMP in AM  Follow up with renal as an outpatient  Hypertension  Mild elevation Continue Norvasc 10 mg qd + Coreg 12.5 mg bid + Labetalol prn for SBP >160.   Hyperlipidemia  Home meds:  Crestor 10 mg qhs resumed in hospital  LDL pending, goal < 70  Continue statin at discharge  Diabetes  HgbA1c 6.1, goal < 7.0  Controlled  Add ISS AC/HS  Other Stroke Risk Factors  Coronary artery disease  Hospital day # 1  Patient presented with right sided sensory deficits, brought to the ED by wife, found to have small left thalamic ICH, most likely related to small vessel disease and uncontrolled HTN. Still with sensory deficits on the right. Was previously on ASA for cardiac stents, this will need to be held for time being given small ICH. Will  need tight BP control, lipid panel, CA dopplers, and ECHO pending.   Natasha Bence, MD PGY-3, Internal Medicine Pager: [phone] I have personally examined this patient, reviewed notes, independently viewed imaging studies, participated in medical decision making and plan of care. I have made any additions or clarifications directly to the above note. Agree with note above. He presented with right hemisensory deficits to do a small left thalamic hemorrhage likely from hypertension. He remains at risk for neurological worsening, hematoma expansion, recurrent stroke and needs close neurological monitoring and aggressive blood pressure control. I had a long discussion with the patient and wife regarding his prognosis, plan for evaluation, treatment and answered questions. Mobilize out of bed. Physical occupational therapy consults. Repeat brain scan and likely discharge tomorrow if stable and safe to walk  Antony Contras, [place] Pager: [phone] 02/02/2015 2:12 PM     To contact Stroke Continuity provider, please refer to [email or web address] After hours, contact General Neurology

## 2015-02-03 ENCOUNTER — Inpatient Hospital Stay (HOSPITAL_COMMUNITY): Payer: Medicare Other

## 2015-02-03 DIAGNOSIS — I619 Nontraumatic intracerebral hemorrhage, unspecified: Secondary | ICD-10-CM

## 2015-02-03 LAB — GLUCOSE, CAPILLARY
GLUCOSE-CAPILLARY: 169 mg/dL — AB (ref 65–99)
Glucose-Capillary: 162 mg/dL — ABNORMAL HIGH (ref 65–99)

## 2015-02-03 MED ORDER — PANTOPRAZOLE SODIUM 40 MG PO TBEC
40.0000 mg | DELAYED_RELEASE_TABLET | Freq: Every day | ORAL | Status: DC
Start: 2015-02-03 — End: 2015-02-03

## 2015-02-03 MED ORDER — ASPIRIN EC 81 MG PO TBEC: 81.0000 mg | DELAYED_RELEASE_TABLET | Freq: Every day | ORAL | Status: AC

## 2015-02-03 NOTE — Progress Notes (Signed)
VASCULAR LAB PRELIMINARY  PRELIMINARY  PRELIMINARY  PRELIMINARY  Carotid duplex completed.    Preliminary report:  Bilateral:  1-39% ICA stenosis.  Vertebral artery flow is antegrade.     SimonettiDoyne Keel, RVS 02/03/2015, 2:55 PM

## 2015-02-03 NOTE — Care Management Note (Signed)
Case Management Note  Patient Details  Name: Todd [name] MRN: [id number] Date of Birth: Sep 25, 1940  Subjective/Objective:                    Action/Plan: Patient being discharged home today. Orders placed for outpatient PT/OT services. CM spoke with the patient and his wife and they would like to go to [facility]. Orders placed in EPIC and information placed on the AVS.   Expected Discharge Date:  02/04/15               Expected Discharge Plan:  Home/Self Care  In-House Referral:     Discharge planning Services  CM Consult  Post Acute Care Choice:    Choice offered to:     DME Arranged:    DME Agency:     HH Arranged:    HH Agency:     Status of Service:  Completed, signed off  Medicare Important Message Given:    Date Medicare IM Given:    Medicare IM give by:    Date Additional Medicare IM Given:    Additional Medicare Important Message give by:     If discussed at [place] of Stay Meetings, dates discussed:    Additional Comments:  Pollie Friar, RN 02/03/2015, 3:25 PM

## 2015-02-03 NOTE — Progress Notes (Signed)
Occupational Therapy Progress Note  Pt demonstrates independence with HEP.  Rt UE coordination improving.     02/03/15 1700  OT Visit Information  Last OT Received On 02/03/15  Assistance Needed +1  History of Present Illness Adm with Rt sided numbness and tingling; CT showed LEFT thalamic ICH  PMHx- THR, Elbow surgery with nerve transposition, CAD, DM  OT Time Calculation  OT Start Time (ACUTE ONLY) 1402  OT Stop Time (ACUTE ONLY) 1423  OT Time Calculation (min) 21 min  Precautions  Precautions Fall  Pain Assessment  Pain Assessment No/denies pain  Cognition  Arousal/Alertness Awake/alert  Behavior During Therapy WFL for tasks assessed/performed  Overall Cognitive Status Within Functional Limits for tasks assessed  General Comments Pt able to perform serial couting backwards by 100 by 7's while tossing ball with only one error   ADL  General ADL Comments Reinforced need to sit when showering, and discussed with wife the need to encourage him to take rest breaks and pace himself   Other Exercises  Other Exercises Pt demonstrate independence with HEP.    OT - End of Session  Activity Tolerance Patient tolerated treatment well  Patient left Other (comment) (taken to procedure)  OT Assessment/Plan  OT Plan Discharge plan remains appropriate  OT Frequency (ACUTE ONLY) Min 2X/week  Follow Up Recommendations Outpatient OT;Supervision/Assistance - 24 hour  OT Equipment None recommended by OT  OT Goal Progression  Progress towards OT goals Progressing toward goals  OT General Charges  $OT Visit 1 Procedure  OT Treatments  $Neuromuscular Re-education 8-22 mins  [facility], OTR/L [phone]

## 2015-02-03 NOTE — Progress Notes (Signed)
Physical Therapy Treatment Patient Details Name: Todd [name] MRN: [id number] DOB: 1940/10/27 Today's Date: 02/03/2015    History of Present Illness Adm with Rt sided numbness and tingling; CT showed LEFT thalamic ICH  PMHx- THR, Elbow surgery with nerve transposition, CAD, DM    PT Comments    Patient with improved mobility (minguard assist when not fatigued or when using RW). Wife present and observed. Good reinforcement to pt to recognize his limitations.   Follow Up Recommendations  Home health PT (with progression to OPPT)     Equipment Recommendations  Rolling walker with 5" wheels    Recommendations for Other Services       Precautions / Restrictions Precautions Precautions: Fall    Mobility  Bed Mobility Overal bed mobility: Modified Independent             General bed mobility comments: HOB elevated  Transfers Overall transfer level: Needs assistance Equipment used: Rolling walker (2 wheeled) Transfers: Sit to/from Stand Sit to Stand: Min assist;Min guard         General transfer comment: did not stabilize against furniture with posterior calves; vc for sequence with RW; improved with practice  Ambulation/Gait Ambulation/Gait assistance: Min assist;Min guard Ambulation Distance (Feet): 220 Feet (seated rest, 150, rest, 150) Assistive device: None;Rolling walker (2 wheeled) Gait Pattern/deviations: Step-through pattern;Decreased stride length;Steppage;Drifts right/left Gait velocity: slow but able to incr to normal   General Gait Details: initially with no device and no UE support with first 75 ft near normal (good coordination and placement of RLE, normal arm swing); as pt fatigued, he began to have more of steppage gait with drifting to his Rt and actually bumped into door on his rt; educated in use of RW and progressed to minguard with RW   Stairs Stairs: Yes Stairs assistance: Min guard Stair Management: Two rails;Step to  pattern;Forwards Number of Stairs: 4 General stair comments: pt recalled proper sequencing (s/p THR)  Wheelchair Mobility    Modified Rankin (Stroke Patients Only) Modified Rankin (Stroke Patients Only) Pre-Morbid Rankin Score: No symptoms Modified Rankin: Moderately severe disability     Balance           Standing balance support: No upper extremity supported Standing balance-Leahy Scale: Fair                      Cognition Arousal/Alertness: Awake/alert Behavior During Therapy: WFL for tasks assessed/performed Overall Cognitive Status: Within Functional Limits for tasks assessed (better awareness of deficits)                      Exercises      General Comments General comments (skin integrity, edema, etc.): wife present and discussed d/c planning. They both feel strongly that pt needs to begin with HHPT and then transition to [place]. Discussed pro's/con's of each.      Pertinent Vitals/Pain Pain Assessment: No/denies pain    Home Living                      Prior Function            PT Goals (current goals can now be found in the care plan section) Acute Rehab PT Goals Patient Stated Goal: walk without device PT Goal Formulation: With patient Time For Goal Achievement: 02/09/15 Potential to Achieve Goals: Good Progress towards PT goals: Progressing toward goals    Frequency  Min 4X/week    PT Plan Current plan remains  appropriate    Co-evaluation             End of Session Equipment Utilized During Treatment: Gait belt Activity Tolerance: Patient limited by fatigue Patient left: with call bell/phone within reach;in bed;with bed alarm set     Time: [id number] PT Time Calculation (min) (ACUTE ONLY): 25 min  Charges:  $Gait Training: 23-37 mins                    G Codes:      Dulse Rutan 2015-02-14, 10:01 AM Pager [phone]

## 2015-02-03 NOTE — Progress Notes (Signed)
Patient is discharged from room 5M11 at this time. Alert and in stable condition. IV site d/c'd as well as tele. Instructions read to patient and wife with understanding verbalized. Left unit via wheelchair with all belongings.

## 2015-02-03 NOTE — Progress Notes (Signed)
Occupational Therapy Treatment Patient Details Name: Todd [name] MRN: [id number] DOB: 1941-02-07 Today's Date: 02/03/2015    History of present illness Adm with Rt sided numbness and tingling; CT showed LEFT thalamic ICH  PMHx- THR, Elbow surgery with nerve transposition, CAD, DM   OT comments  Pt demonstrates improving FMC Rt UE.   Written HEP provided, will review again with pt and address safety issues with ADLs.   Follow Up Recommendations  Outpatient OT;Supervision/Assistance - 24 hour    Equipment Recommendations  None recommended by OT    Recommendations for Other Services      Precautions / Restrictions Precautions Precautions: Fall       Mobility Bed Mobility                  Transfers                      Balance                                   ADL Overall ADL's : Needs assistance/impaired Eating/Feeding: Modified independent;Sitting Eating/Feeding Details (indicate cue type and reason): Pt able to cut food using Rt UE to stablize food object                                    General ADL Comments: Pt reports improved Rt UE funciton.  He is able to tie gown today, and reports he has been working on it most of morning       Vision                 Additional Comments: Pt reports he read a magazine last pm and the newspaper this am.  He denies any difficulties    Perception     Praxis      Cognition   Behavior During Therapy: WFL for tasks assessed/performed Overall Cognitive Status: Within Functional Limits for tasks assessed                       Extremity/Trunk Assessment               Exercises Other Exercises Other Exercises: Pt provided with written HEP for Rt UE and reviewed it with he and wife.   Pt able to return demonstration with min cues.   Discussed need to use Rt UE functionally as much as able.  Did begin discussion re: sensory loss precautions and the need to be  cautious around sharp objects and heat sources    Shoulder Instructions       General Comments      Pertinent Vitals/ Pain       Pain Assessment: No/denies pain  Home Living                                          Prior Functioning/Environment              Frequency Min 2X/week     Progress Toward Goals  OT Goals(current goals can now be found in the care plan section)  Progress towards OT goals: Progressing toward goals  ADL Goals Pt Will Perform Lower Body Bathing: with supervision;sit to/from stand Pt Will Perform  Lower Body Dressing: with supervision;sit to/from stand Pt Will Transfer to Toilet: with supervision;ambulating;regular height toilet;bedside commode;grab bars Pt Will Perform Toileting - Clothing Manipulation and hygiene: with supervision;sit to/from stand Pt Will Perform Tub/Shower Transfer: Shower transfer;with supervision;ambulating;shower seat Additional ADL Goal #1: Pt will be independent with HEP for Rt UE Additional ADL Goal #2: Pt will demonstrates anticipatory awareness of deficits independently   Plan Discharge plan remains appropriate    Co-evaluation                 End of Session     Activity Tolerance Patient tolerated treatment well   Patient Left in chair;with call bell/phone within reach;with chair alarm set;with family/visitor present   Nurse Communication          Time: [id number] OT Time Calculation (min): 18 min  Charges: OT General Charges $OT Visit: 1 Procedure OT Treatments $Neuromuscular Re-education: 8-22 mins  Ramil Edgington M 02/03/2015, 2:00 PM

## 2015-02-03 NOTE — Discharge Summary (Signed)
Stroke Discharge Summary  Patient ID: Todd [name]   MRN: [id number]      DOB: [birth year]  Date of Admission: 02/01/2015 Date of Discharge: 02/03/2015  Attending Physician:  Garvin Fila, MD, Stroke MD Patient's PCP:  Adrian Prows, MD  DISCHARGE DIAGNOSIS:  1. 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 Resultant Right-sided sensory deficits and mild coordination dysfunction.  3. Acute on CKD 4. Hypertension  5. Hyperlipidemia 6. Diabetes 7. Coronary artery disease 8. Obesity, BMI: Body mass index is 30.11 kg/(m^2).  Past Medical History  Diagnosis Date  . PONV (postoperative nausea and vomiting)   . Hyperlipidemia     takes Crestor daily  . Hypertension     takes Amlodipine and Metoprolol  . Coronary artery disease   . 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 Joint pain     knees,elbows,back  . Diabetes mellitus     lantus and novolog;fasting sugars run 70-120  . CKD (chronic kidney disease)    Past Surgical History  Procedure Laterality Date  . Knee arthroscopy  80's    right knee  . Cholecystectomy  20+yrs ago  . Cardiac catheterization  01/2006    2 stents placed  . Back surgery  09/15/10  . Cataract extraction  12/21/10--02/01/11    right and left  . Elbow surgery  2012    right elbow  . Colonoscopy    . Ulnar nerve transposition  03/02/2011    Procedure: ULNAR NERVE DECOMPRESSION/TRANSPOSITION;  Surgeon: Peggyann Shoals, MD;  Location: [place] [facility];  Service: Neurosurgery;  Laterality: Left;  LEFT ulnar nerve decompression  . Total hip arthroplasty        Medication List    STOP taking these medications        ciprofloxacin 250 MG tablet  Commonly known as:  CIPRO     metroNIDAZOLE 500 MG tablet  Commonly known as:  FLAGYL     traMADol 50 MG tablet  Commonly known as:  ULTRAM      TAKE these medications        amLODipine 5 MG tablet  Commonly known as:  NORVASC  Take 10 mg by mouth daily  after breakfast.     aspirin EC 81 MG tablet  Take 1 tablet (81 mg total) by mouth daily.  Start taking on:  02/18/2015     carvedilol 6.25 MG tablet  Commonly known as:  COREG  Take 2 tablets (12.5 mg total) by mouth 2 (two) times daily with a meal.     cholecalciferol 1000 UNITS tablet  Commonly known as:  VITAMIN D  Take 1,000 Units by mouth daily.     insulin glargine 100 UNIT/ML injection  Commonly known as:  LANTUS  Inject 11 Units into the skin at bedtime.     insulin lispro 100 UNIT/ML injection  Commonly known as:  HUMALOG  Inject 6 Units into the skin daily with supper.     rosuvastatin 10 MG tablet  Commonly known as:  CRESTOR  Take 10 mg by mouth daily.     sodium bicarbonate 650 MG tablet  Take 1,300 mg by mouth 3 (three) times daily.        LABORATORY STUDIES CBC    Component Value Date/Time   WBC 6.1 02/01/2015 0917   WBC 8.8 02/28/2014 0054   RBC 3.85* 02/01/2015 0917   RBC 3.09* 02/28/2014 0054   HGB  12.2* 02/01/2015 0917   HGB 9.8* 02/28/2014 0054   HCT 35.0* 02/01/2015 0917   HCT 29.8* 02/28/2014 0054   PLT 288 02/01/2015 0917   PLT 195 02/28/2014 0054   MCV 90.9 02/01/2015 0917   MCV 96 02/28/2014 0054   MCH 31.7 02/01/2015 0917   MCH 31.6 02/28/2014 0054   MCHC 34.9 02/01/2015 0917   MCHC 32.8 02/28/2014 0054   RDW 12.4 02/01/2015 0917   RDW 12.6 02/28/2014 0054   LYMPHSABS 1.2 02/01/2015 0917   MONOABS 0.7 02/01/2015 0917   EOSABS 0.1 02/01/2015 0917   BASOSABS 0.1 02/01/2015 0917   CMP    Component Value Date/Time   NA 141 02/01/2015 0917   NA 146* 02/28/2014 0054   K 4.3 02/01/2015 0917   K 4.5 02/28/2014 0054   CL 112* 02/01/2015 0917   CL 117* 02/28/2014 0054   CO2 18* 02/01/2015 0917   CO2 23 02/28/2014 0054   GLUCOSE 128* 02/01/2015 0917   GLUCOSE 96 02/28/2014 0054   BUN 37* 02/01/2015 0917   BUN 47* 02/28/2014 0054   CREATININE 4.47* 02/01/2015 0917   CREATININE 3.23* 02/28/2014 0054   CALCIUM 8.2* 02/01/2015  0917   CALCIUM 7.3* 02/28/2014 0054   PROT 6.8 02/01/2015 0917   PROT 5.9* 02/28/2014 0054   ALBUMIN 3.4* 02/01/2015 0917   ALBUMIN 2.8* 02/28/2014 0054   AST 53* 02/01/2015 0917   AST 57* 02/28/2014 0054   ALT 79* 02/01/2015 0917   ALT 38 02/28/2014 0054   ALKPHOS 101 02/01/2015 0917   ALKPHOS 81 02/28/2014 0054   BILITOT 0.5 02/01/2015 0917   BILITOT 0.3 02/28/2014 0054   GFRNONAA 12* 02/01/2015 0917   GFRNONAA 20* 02/28/2014 0054   GFRNONAA 31* 09/20/2011 0511   GFRAA 14* 02/01/2015 0917   GFRAA 24* 02/28/2014 0054   GFRAA 35* 09/20/2011 0511   COAGS Lab Results  Component Value Date   INR 1.02 02/01/2015   INR 1.15 01/25/2015   INR 0.9 09/04/2011   Lipid Panel    Component Value Date/Time   CHOL 109 02/02/2015 1204   TRIG 181* 02/02/2015 1204   HDL 31* 02/02/2015 1204   CHOLHDL 3.5 02/02/2015 1204   VLDL 36 02/02/2015 1204   LDLCALC 42 02/02/2015 1204   HgbA1C  Lab Results  Component Value Date   HGBA1C 6.1* 01/25/2015   Cardiac Panel (last 3 results) No results for input(s): CKTOTAL, CKMB, TROPONINI, RELINDX in the last 72 hours. Urinalysis    Component Value Date/Time   COLORURINE Straw 02/28/2014 0312   APPEARANCEUR Clear 02/28/2014 0312   LABSPEC 1.012 02/28/2014 0312   PHURINE 6.0 02/28/2014 0312   GLUCOSEU 150 mg/dL 02/28/2014 0312   HGBUR 1+ 02/28/2014 0312   BILIRUBINUR Negative 02/28/2014 0312   KETONESUR Negative 02/28/2014 0312   PROTEINUR >=500 02/28/2014 0312   NITRITE Negative 02/28/2014 0312   LEUKOCYTESUR Negative 02/28/2014 0312   Urine Drug Screen No results found for: LABOPIA, COCAINSCRNUR, LABBENZ, AMPHETMU, THCU, LABBARB  Alcohol Level No results found for: [facility]   SIGNIFICANT DIAGNOSTIC STUDIES  Ct Abdomen Pelvis Wo Contrast 01/24/2015  1. Small volume of fluid adjacent to some loops of bowel in the right side of the abdomen (distal ileum). This is nonspecific, but could indicate a focus of enteritis, with some reactive free  fluid adjacent to inflamed loops of small bowel. 2. Small ventral hernia in the epigastric region containing only omental fat. No associated bowel incarceration or obstruction at this time. 3. 7  mm left lower lobe pulmonary nodule.   Chest 2 View 02/02/2015   There is no evidence of aspiration pneumonia nor other acute cardiopulmonary abnormality.   Ct Head Wo Contrast  02/01/2015  Small, acute appearing hemorrhage in the left thalamus is likely hypertensive in nature. Atrophy and chronic microvascular ischemic change.   Dg Abd 2 Views 01/26/2015  1. Persistent minimally dilated loop of distal small bowel, most likely terminal ileum in the right lower quadrant. No evidence of bowel obstruction. Colonic gas pattern is normal. Oral contrast from prior CT is noted throughout the colon rectum. No free air. 2.  Aortoiliac atherosclerotic vascular disease .   Dg Abd Acute W/chest 01/25/2015  No acute cardiopulmonary process. Findings suspicious for small bowel obstruction, less likely ileus.   US Abdomen Limited Ruq 01/25/2015  Prior cholecystectomy. No biliary dilatation No acute finding by ultrasound.   2D Echocardiogram  - Left ventricle: The cavity size was normal. Wall thickness wasincreased in a pattern of mild LVH. Systolic function was normal.The estimated ejection fraction was in the range of 55% to 60%Wall motion was normal; there were no regional wall motionabnormalities. Doppler parameters are consistent with abnormalleft ventricular relaxation (grade 1 diastolic dysfunction).Doppler parameters are consistent with high ventricular fillingpressure. - Mitral valve: Calcified annulus. Mobility was not restricted.Transvalvular velocity was within the normal range. There was noevidence for stenosis. There was mild regurgitation. - Right ventricle: The cavity size was normal. Wall thickness wasnormal. Systolic function was normal. - Atrial septum: No defect or patent foramen ovale was  identified. - Tricuspid valve: There was mild regurgitation. - Pulmonary arteries: Systolic pressure was mildly increased. PApeak pressure: 34 mm Hg (S). - Inferior vena cava: The vessel was normal in size. Therespirophasic diameter changes were in the normal range (>= 50%),consistent with normal central venous pressure.  Carotid Doppler   Bilateral - 1% to 39% ICA stenosis. ECA stenosis. Vertebral artery flow is antegrade.   HISTORY OF PRESENT ILLNESS TAGGART WASHABAUGH is an 74 y.o. male with history of coronary artery disease with stents, insulin-dependent diabetes, and hypertension. He presents for evaluation of numbness of the right arm, right leg, and right side of his face. He went to bed last night at 11:30 feeling fine and woke up with these symptoms at approximately 2 AM. He was brought to hospital due to the numbness on his right side. Currently he is under no distress. CT head shows a LEFT thalamic ICH.   Patient was not administered TPA secondary to [place].    HOSPITAL COURSE Mr. SHLOK ENO is a 74 y.o. 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   CT head: Small, acute appearing hemorrhage in the left thalamus is likely hypertensive in nature.  Carotid Doppler No significant stenosis   2D Echo No source of embolus   LDL 42  HgbA1c 6.1  aspirin 81 mg daily prior to admission, now on no antithrombotic given ICH for 2 more weeks, then ok to resume  Therapy recommendations: HH PT, OP OT   Disposition: home with OP PT and OT, wife can drive him. Pt advised not to drive for now, can resume driving when stable; discharge home  Acute on CKD  Cr mildly elevated from baseline.  No electrolyte abnormalities.  Mild acidosis.   Restart HCO3 1300 mg tid  Follow up with renal as an outpatient  Hypertension   Mild elevation  Continued home meds of Norvasc 10 mg qd + Coreg 12.5 mg bid  Anticipate will slowly decline. No need for additional medications at time of discharge  Needs OP follow up  Hyperlipidemia  Home meds: Crestor 10 mg qhs resumed in hospital  LDL 42, goal < 70  Continue statin at discharge  Diabetes  HgbA1c 6.1, goal < 7.0  Controlled  Other Stroke Risk Factors  Coronary artery disease  Obesity, Body mass index is 30.11 kg/(m^2).    DISCHARGE EXAM Blood pressure 166/74, pulse 78, temperature 98.3 F (36.8 C), temperature source Oral, resp. rate 20, height 5\' 9"  (1.753 m), weight 92.534 kg (204 lb), SpO2 99 %. General - Well nourished, well developed, in no apparent distress.  Cardiovascular - Regular rate and rhythm with no murmur.  Mental Status -  Level of arousal and orientation to time, place, and person were intact. Language including expression, naming, repetition, comprehension was assessed and found intact. Attention span and concentration were normal. Recent and remote memory were intact. Fund of Knowledge was assessed and was intact.  Cranial Nerves II - XII - II - Visual field intact. III, IV, VI - Extraocular movements intact. V - Facial sensation slightly impaired on the right. VII - Facial movement intact bilaterally. VIII - Hearing & vestibular intact bilaterally. X - Palate elevates symmetrically. XI - Chin turning & shoulder shrug intact bilaterally. XII - Tongue protrusion intact.  Motor Strength - The patient's strength was 4+/5 in the right extremities, otherwise normal, pronator drift was absent. Bulk was normal and fasciculations were absent.  Motor Tone - Muscle tone was assessed at the neck and appendages and was normal.  Reflexes - The patient's reflexes were 1+ in all extremities and he had no pathological  reflexes.  Sensory - Decreased sensation on the right face, upper, and lower extremities.   Coordination - Slightly impaired coordination on the right.   Gait and Station - Deferred  Discharge Diet   Diet Carb Modified Fluid consistency:: Thin; Room service appropriate?: Yes liquids  DISCHARGE PLAN  Disposition:  Home with wife   OP PT and OT  In 2 weeks, on 12.1.2016, resume aspirin 81 mg daily for secondary stroke prevention.  Follow-up FITZGERALD, DAVID, MD in 2 weeks.  Follow-up with Dr. Antony Contras, Stroke Clinic in 2 months.  45 minutes were spent preparing discharge.  Kalida Douds for Pager information 02/04/2015 3:08 PM   I have personally examined this patient, reviewed notes, independently viewed imaging studies, participated in medical decision making and plan of care. I have made any additions or clarifications directly to the above note. Agree with note above.  Antony Contras, MD Medical Director [facility] Pager: [phone] 02/04/2015 7:48 PM

## 2015-02-09 ENCOUNTER — Encounter: Payer: Self-pay | Admitting: Rehabilitation

## 2015-02-09 ENCOUNTER — Ambulatory Visit: Payer: Medicare Other | Attending: Nurse Practitioner | Admitting: Rehabilitation

## 2015-02-09 DIAGNOSIS — R4189 Other symptoms and signs involving cognitive functions and awareness: Secondary | ICD-10-CM | POA: Diagnosis present

## 2015-02-09 DIAGNOSIS — I6980 Unspecified sequelae of other cerebrovascular disease: Secondary | ICD-10-CM | POA: Diagnosis present

## 2015-02-09 DIAGNOSIS — R279 Unspecified lack of coordination: Secondary | ICD-10-CM | POA: Diagnosis present

## 2015-02-09 DIAGNOSIS — R201 Hypoesthesia of skin: Secondary | ICD-10-CM | POA: Diagnosis present

## 2015-02-09 DIAGNOSIS — R449 Unspecified symptoms and signs involving general sensations and perceptions: Secondary | ICD-10-CM

## 2015-02-09 DIAGNOSIS — G8193 Hemiplegia, unspecified affecting right nondominant side: Secondary | ICD-10-CM | POA: Insufficient documentation

## 2015-02-09 DIAGNOSIS — I69898 Other sequelae of other cerebrovascular disease: Secondary | ICD-10-CM | POA: Insufficient documentation

## 2015-02-09 DIAGNOSIS — R531 Weakness: Secondary | ICD-10-CM | POA: Insufficient documentation

## 2015-02-09 DIAGNOSIS — R269 Unspecified abnormalities of gait and mobility: Secondary | ICD-10-CM

## 2015-02-09 DIAGNOSIS — R2681 Unsteadiness on feet: Secondary | ICD-10-CM | POA: Diagnosis present

## 2015-02-09 DIAGNOSIS — IMO0002 Reserved for concepts with insufficient information to code with codable children: Secondary | ICD-10-CM

## 2015-02-09 DIAGNOSIS — Z7409 Other reduced mobility: Secondary | ICD-10-CM | POA: Diagnosis present

## 2015-02-09 NOTE — Therapy (Signed)
[facility] [address] [place], [place], [address] Phone: [phone]   Fax:  [phone]  Physical Therapy Evaluation  Patient Details  Name: Todd [name] MRN: [id number] Date of Birth: [birth year] Referring Provider: Burnetta Sabin, NP  Encounter Date: 02/09/2015      PT End of Session - 02/09/15 1312    Visit Number 1   Number of Visits 84  may transition if needed to [place] OP after 4 weeks   Date for PT Re-Evaluation 04/10/15   Authorization Type UHC MCR-Gcodes on every 10th visit.    PT Start Time 1100   PT Stop Time 1148   PT Time Calculation (min) 48 min   Activity Tolerance Patient limited by fatigue   Behavior During Therapy [facility] for tasks assessed/performed      Past Medical History  Diagnosis Date  . PONV (postoperative nausea and vomiting)   . Hyperlipidemia     takes Crestor daily  . Hypertension     takes Amlodipine and Metoprolol  . Coronary artery disease   . 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 Joint pain     knees,elbows,back  . Diabetes mellitus     lantus and novolog;fasting sugars run 70-120  . CKD (chronic kidney disease)     Past Surgical History  Procedure Laterality Date  . Knee arthroscopy  80's    right knee  . Cholecystectomy  20+yrs ago  . Cardiac catheterization  01/2006    2 stents placed  . Back surgery  09/15/10  . Cataract extraction  12/21/10--02/01/11    right and left  . Elbow surgery  2012    right elbow  . Colonoscopy    . Ulnar nerve transposition  03/02/2011    Procedure: ULNAR NERVE DECOMPRESSION/TRANSPOSITION;  Surgeon: Peggyann Shoals, MD;  Location: [place] [facility];  Service: Neurosurgery;  Laterality: Left;  LEFT ulnar nerve decompression  . Total hip arthroplasty      There were no vitals filed for this visit.  Visit Diagnosis:  Weakness due to cerebrovascular accident - Plan: PT plan of care cert/re-cert  Unsteadiness - Plan: PT plan of care  cert/re-cert  Abnormality of gait - Plan: PT plan of care cert/re-cert  Sensory deficit, right - Plan: PT plan of care cert/re-cert      Subjective Assessment - 02/09/15 1105    Subjective "I guess I had a stroke on the R side." "My hand and my leg feels like its numb or asleep."     Patient is accompained by: Family member  wife   Limitations Standing;Walking;House hold activities   Patient Stated Goals to get back to being independent   Currently in Pain? No/denies            [facility] PT Assessment - 02/09/15 0001    Assessment   Medical Diagnosis thalamic ICH   Referring Provider Burnetta Sabin, NP   Onset Date/Surgical Date 02/01/15   Hand Dominance Left   Precautions   Precautions Fall   Restrictions   Weight Bearing Restrictions No   Balance Screen   Has the patient fallen in the past 6 months No   Has the patient had a decrease in activity level because of a fear of falling?  No   Is the patient reluctant to leave their home because of a fear of falling?  No   Home Ecologist residence   Living Arrangements Spouse/significant other  Available Help at Discharge Family;Available 24 hours/day   Type of Home House   Home Access Stairs to enter   Entrance Stairs-Number of Steps 3  garage-5, front-8-10   Entrance Stairs-Rails Can reach both;Right;Left   Home Layout Two level;Able to live on main level with bedroom/bathroom   Alternate Level Stairs-Number of Steps 15-20   Alternate Level Stairs-Rails Left   Home Equipment Walker - 2 wheels;Shower seat;Shower seat - built in   Prior Function   Level of Independence Independent   Vocation Full time employment   Psychologist, forensic  wants to return to work   Leisure play golf   Cognition   Overall Cognitive Status Within Functional Limits for tasks assessed  notes that with fatigue, needs increased time to complete ta   Sensation   Light Touch Impaired Detail   Light Touch  Impaired Details Impaired RUE;Impaired RLE   Coordination   Gross Motor Movements are Fluid and Coordinated No   Fine Motor Movements are Fluid and Coordinated No   Coordination and Movement Description Decreased strength causing decreased coordination   Heel Shin Test decreased smoothness due to strength deficits   ROM / Strength   AROM / PROM / Strength Strength   Strength   Overall Strength Deficits   Overall Strength Comments R hip flex 3-/5, R knee flex 3+/5, R knee ext 3+/5, R ankle DF 3+/5, R ankle PF 4/5, LLE WFL  functionally note that R hip weak   Transfers   Transfers Sit to Stand;Stand to Sit   Sit to Stand 6: Modified independent (Device/Increase time)   Stand to Sit 6: Modified independent (Device/Increase time)   Ambulation/Gait   Ambulation/Gait Yes   Ambulation/Gait Assistance 5: Supervision;4: Min guard   Ambulation Distance (Feet) 115 Feet   Assistive device Rolling walker;None   Gait Pattern Step-through pattern;Decreased stride length;Decreased arm swing - right;Decreased arm swing - left;Decreased dorsiflexion - right;Wide base of support;Trunk flexed;Lateral hip instability;Poor foot clearance - right   Ambulation Surface Level;Indoor   Gait velocity 1.71 ft/sec   Stairs Yes   Stairs Assistance 5: Supervision   Stair Management Technique Two rails;Step to pattern;Forwards   Number of Stairs 4   Height of Stairs 6   Standardized Balance Assessment   Standardized Balance Assessment Dynamic Gait Index   Dynamic Gait Index   Level Surface Moderate Impairment   Change in Gait Speed Moderate Impairment   Gait with Horizontal Head Turns Moderate Impairment   Gait with Vertical Head Turns Moderate Impairment   Gait and Pivot Turn Mild Impairment   Step Over Obstacle Severe Impairment   Step Around Obstacles Mild Impairment   Steps Moderate Impairment   Total Score 9   DGI comment: Scores of 19 or less are predictive of falls in older community living adults                            PT Education - 02/09/15 1311    Education provided Yes   Education Details Education on evaluation findings, POC, and walking program   Person(s) Educated Patient;Spouse   Methods Explanation;Handout   Comprehension Verbalized understanding          PT Short Term Goals - 02/09/15 1319    PT SHORT TERM GOAL #1   Title Pt will initiate HEP for RLE strengthening and balance to indicate decreased fall risk and improved functional mobility.  (Target Date: 03/09/15)   PT SHORT  TERM GOAL #2   Title Pt will perform 6MWT and increase score by 150' in order to indicate functional improvement in endurance. (Target Date: 03/09/15)   PT SHORT TERM GOAL #3   Title Pt will verbalize compliance with walking program to indicate improvement in functional endurance.  (Target Date: 03/09/15)   PT SHORT TERM GOAL #4   Title Pt will increase gait speed to 2.31 ft/sec in order to indicate decreased fall risk and more efficient gait.  (Target Date: 03/09/15)   PT SHORT TERM GOAL #5   Title Pt will increase DGI to 13/24 in order to indicate decreased fall risk and functional improvemnet in balance. (Target Date: 03/09/15)   Additional Short Term Goals   Additional Short Term Goals Yes   PT SHORT TERM GOAL #6   Title Pt will ambulate 300' on indoor surfaces with SPC at mod I level in order to indicate safe home negotiation.  (Target Date: 03/09/15)           PT Long Term Goals - 2015/02/13 1331    PT LONG TERM GOAL #1   Title Pt will be independent with HEP in order to indicate improved RLE strength, balance and decreased fall risk. (Target Date: 04/06/15)   PT LONG TERM GOAL #2   Title Pt will increase 6MWT distance by 150' in order to indicate improvement in functional endurance.  (Target Date: 04/06/15)   PT LONG TERM GOAL #3   Title Pt will increase DGI to 20/24 in order to indicate decreased fall risk and functional improvement in balance. (Target Date:  04/06/15)   PT LONG TERM GOAL #4   Title Pt will verbalize return to community fitness/leisure activity to indicate return to prior level of function. (Target Date: 04/06/15)   PT LONG TERM GOAL #5   Title Pt will ambulate >500' on outdoor surfaces (curb and ramp included) without AD at mod I level in order to indicate safe community negotiation. 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  Upon evaluation, note slower gait speed of 1.71 ft/sec indicative of increased fall risk, DGI score of 9/24, also indicative of significant fall risk.  Also note impaired gait pattern with decreased strength in RLE, with hip instability, decreased ability to weight shift onto RLE and marked decrease in endurance during session.  Feel that pt is great candiidate for OP neuro PT to address previously mentioned deficits.    Pt will benefit from skilled therapeutic intervention in order to improve on the following deficits Abnormal gait;Decreased activity tolerance;Decreased balance;Decreased coordination;Decreased endurance;Decreased mobility;Decreased strength;Impaired perceived functional ability;Impaired sensation;Impaired UE functional use;Postural dysfunction   Rehab Potential Excellent   PT Frequency 2x / week   PT Duration 8 weeks  may transition to OP in [place] following 4 weeks   PT Treatment/Interventions ADLs/Self Care Home Management;Electrical Stimulation;DME Instruction;Gait training;Stair training;Functional mobility training;Therapeutic activities;Therapeutic exercise;Balance training;Neuromuscular re-education;Patient/family education;Orthotic Fit/Training;Visual/perceptual remediation/compensation   PT Next Visit Plan Assess compliance with walking program, add strengthening (esp R hip and R ankle DF) for HEP, balance, gait with SPC   PT  Home Exercise Plan see pt instruction   Consulted and Agree with Plan of Care Patient;Family member/caregiver   Family Member Consulted wife          G-Codes - February 13, 2015  1337    Functional Assessment Tool Used DGI: 9/24   Functional Limitation Mobility: Walking and moving around   Mobility: Walking and Moving Around Current Status [id number]) At least 60 percent but less than 80 percent impaired, limited or restricted   Mobility: Walking and Moving Around Goal Status [phone]) At least 1 percent but less than 20 percent impaired, limited or restricted       Problem List Patient Active Problem List   Diagnosis Date Noted  . Thalamic hemorrhage ([place])   . ICH (intracerebral hemorrhage) ([place]) 02/01/2015  . Abdominal pain 01/25/2015  . Acute hepatitis   . Enteritis   . Ileus ([place])   . Uncontrollable vomiting    Cameron Sprang, PT, MPT [facility] [address] [place], [place], [address] Phone: [phone]   Fax:  [phone] 02/09/2015, 1:40 PM  Name: Todd [name] MRN: [id number] Date of Birth: 03-29-1940

## 2015-02-09 NOTE — Patient Instructions (Signed)
Walking Program:    I want you to start a walking program for endurance.  Start with 3 mins at a time, once a day.  If after a week of doing 3 mins gets easier, add one min.  Add one minute each week if getting easier.  If the time that you are doing that week is still difficult, stay on that amount of time until it gets easier.

## 2015-02-15 ENCOUNTER — Ambulatory Visit: Payer: Medicare Other | Admitting: Occupational Therapy

## 2015-02-15 ENCOUNTER — Encounter: Payer: Self-pay | Admitting: Occupational Therapy

## 2015-02-15 DIAGNOSIS — IMO0002 Reserved for concepts with insufficient information to code with codable children: Secondary | ICD-10-CM

## 2015-02-15 DIAGNOSIS — I69898 Other sequelae of other cerebrovascular disease: Secondary | ICD-10-CM | POA: Diagnosis not present

## 2015-02-15 DIAGNOSIS — R201 Hypoesthesia of skin: Secondary | ICD-10-CM

## 2015-02-15 DIAGNOSIS — Z7409 Other reduced mobility: Secondary | ICD-10-CM

## 2015-02-15 DIAGNOSIS — G8193 Hemiplegia, unspecified affecting right nondominant side: Secondary | ICD-10-CM

## 2015-02-15 NOTE — Therapy (Signed)
[facility] [address] [place], [place], [address] Phone: [phone]   Fax:  [phone]  Occupational Therapy Evaluation  Patient Details  Name: Todd [name] MRN: [id number] Date of Birth: Feb 02, 1941 Referring Provider: Dr. Antony Contras  Encounter Date: 02/15/2015      OT End of Session - 02/15/15 1452    Visit Number 1   Number of Visits 16   Date for OT Re-Evaluation 04/12/15   Authorization Type UHC medicare   Authorization Time Period 60 days   OT Start Time 0932   OT Stop Time 1015   OT Time Calculation (min) 43 min   Activity Tolerance Patient tolerated treatment well      Past Medical History  Diagnosis Date  . PONV (postoperative nausea and vomiting)   . Hyperlipidemia     takes Crestor daily  . Hypertension     takes Amlodipine and Metoprolol  . Coronary artery disease   . 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 Joint pain     knees,elbows,back  . Diabetes mellitus     lantus and novolog;fasting sugars run 70-120  . CKD (chronic kidney disease)     Past Surgical History  Procedure Laterality Date  . Knee arthroscopy  80's    right knee  . Cholecystectomy  20+yrs ago  . Cardiac catheterization  01/2006    2 stents placed  . Back surgery  09/15/10  . Cataract extraction  12/21/10--02/01/11    right and left  . Elbow surgery  2012    right elbow  . Colonoscopy    . Ulnar nerve transposition  03/02/2011    Procedure: ULNAR NERVE DECOMPRESSION/TRANSPOSITION;  Surgeon: Peggyann Shoals, MD;  Location: [facility];  Service: Neurosurgery;  Laterality: Left;  LEFT ulnar nerve decompression  . Total hip arthroplasty      There were no vitals filed for this visit.  Visit Diagnosis:  Hemiplegia affecting right nondominant side ([name]) - Plan: Ot plan of care cert/re-cert  Impaired sensation - Plan: Ot plan of care cert/re-cert  Lack of coordination due to stroke - Plan: Ot plan of care  cert/re-cert  Impaired functional mobility and activity tolerance - Plan: Ot plan of care cert/re-cert      Subjective Assessment - 02/15/15 0936    Subjective  I worked with an OT in the hospital   Patient is accompained by: Family member  wife [name]   Pertinent History L Thalamic CVA, h/o of TKR   Patient Stated Goals Get bacl like I was - play golf, work do anything I want to   Currently in Pain? No/denies           [facility] OT Assessment - 02/15/15 0001    Assessment   Diagnosis L thalamic CVA   Referring Provider Dr. Antony Contras   Onset Date 02/01/15   Assessment Pt had ulnar nerve surgery in 2012. and has some permanent damage.  Pt feels he learned how to accomodate activities prior to stroke   Prior Therapy PT and OT in acute care only   Precautions   Precautions Fall   Restrictions   Weight Bearing Restrictions No   Balance Screen   Has the patient fallen in the past 6 months Yes  Pt seeing PT    [place] expects to be discharged to: Private residence   Living Arrangements Spouse/significant other   Available Help at Discharge Available 24 hours/day  Type of [place]  5 steps railing on L as you ascend   Home Layout Two level   Bathroom Art gallery manager Handicapped height   Additional Comments Pt has shower seat with back no grab bars   Prior Function   Level of Independence Independent   Vocation Full time employment   Psychologist, forensic   Leisure play golf   ADL   Eating/Feeding Independent   Grooming Independent   Upper Body Bathing Modified independent   Lower Body Bathing Modified independent   Upper Body Dressing --  modified independent   Lower Body Dressing Minimal assistance  wife assists with donning pants on Right leg   Toilet Tranfer Modified independent   [place]  Transfer Supervision/safety   ADL comments Wife supervises shower transfers   IADL   Shopping --  N/a - pt never shopped before   [employer] Does not participate in any housekeeping tasks  pt's wife did most activties prior to stroke   Meal Prep Needs to have meals prepared and served  pt only did sandwich and snack prep prior to stroke   [employer] Relies on family or friends for transportation   Medication Management Is responsible for taking medication in correct dosages at correct time   Financial Management --  n/a wife did this prior to stroke   Mobility   Mobility Status Needs assist   Mobility Status Comments supervision for community mobility   Written Expression   Dominant Hand Left   Vision - History   Baseline Vision Wears glasses all the time   Vision Assessment   Comment Pt reports no changes in vision   Activity Tolerance   Activity Tolerance Tolerate 30+ min activity without fatigue   Cognition   Overall Cognitive Status Within Functional Limits for tasks assessed   Sensation   Light Touch Impaired by gross assessment   Hot/Cold Appears Intact   Proprioception Appears Intact   Coordination   Gross Motor Movements are Fluid and Coordinated Yes   Fine Motor Movements are Fluid and Coordinated No   Finger Nose Finger Test mild dysmetria   9 Hole Peg Test Right;Left   Right 9 Hole Peg Test 33.01   Left 9 Hole Peg Test 48.22   Tone   Assessment Location Right Upper Extremity   ROM / Strength   AROM / PROM / Strength AROM;Strength   AROM   Overall AROM  Within functional limits for tasks performed   Strength   Overall Strength Deficits   Overall Strength Comments RUE shoulder flexion 4/5 all other WFL's   Hand Function   Right Hand Gross Grasp Impaired   Right Hand Grip (lbs) 55   Left Hand Gross Grasp Functional   Left Hand Grip (lbs) 70   RUE Tone   RUE Tone Within Functional Limits                           OT  Short Term Goals - 02/15/15 1430    OT SHORT TERM GOAL #1   Title Pt and wife will be mod I with HEP - 03/15/2015   Status New   OT SHORT TERM GOAL #2   Title Pt will demonstrate improved coordination in RUE as evidenced by decreasing time on 9 hole peg by at least  5 seconds (baseline = 48.22)   Status New   OT SHORT TERM GOAL #3   Title Pt will demonstrate improved grip strength by 5 pounds in LUE to assist in functional tasks (baseline= 55 pounds)   Status New   OT SHORT TERM GOAL #4   Title Pt will be mod I with shower transfers   Status New   OT SHORT TERM GOAL #5   Title Pt will be mod I with LB dressing   Status New           OT Long Term Goals - 03/01/15 1434    OT LONG TERM GOAL #1   Title Pt and wife will be mod I with upgraded HEP prn - 04/12/2015   Status New   OT LONG TERM GOAL #2   Title Pt will demonstrate improved coordination in RUE as evidenced by decreasing time on 9 hole peg test by 8 seconds (baseline=48.22)   Status New   OT LONG TERM GOAL #3   Title Pt will be able to lift 5 pound object off overhead shelf 3x with RUE.   Status New   OT LONG TERM GOAL #4   Title Pt will demonstrate improved grip strength by at least 7 pounds to assist in functional activities (baseline=55 pounds)   OT LONG TERM GOAL #5   Title Pt will be able to use RUE as non domnant hand for computer use for work    Status New               Plan - 01-Mar-2015 1448    Clinical Impression Statement Pt is a 74 year old male s/p L thalamic CVA on 02/01/2015. Pt was hospitalized from 02/01/2015 - 02/03/2015. Pt presents today with the following deficits that impact his ability to complete ADL and IADL activities independently: R non dominant hemiplegia, impaired sensaton, impaired functional use of RUE, decreased coordination, decreased grip strength, decreased balance, decreased activity tolerance. Pt will benefit from skilled OT to address these deficits and maximize independence.     Pt will benefit from skilled therapeutic intervention in order to improve on the following deficits (Retired) Decreased activity tolerance;Decreased balance;Decreased coordination;Decreased mobility;Decreased strength;Impaired UE functional use;Impaired sensation   Rehab Potential Good   OT Frequency 2x / week   OT Duration 8 weeks   OT Treatment/Interventions Self-care/ADL training;Moist Heat;Fluidtherapy;Ultrasound;Therapeutic exercise;Neuromuscular education;DME and/or AE instruction;Manual Therapy;Functional Mobility Training;Splinting;Therapeutic activities;Patient/family education;Balance training   Plan initiate HEP   Consulted and Agree with Plan of Care Patient;Family member/caregiver   Family Member Consulted wife Lizabeth Leyden - 2015-03-01 1445    Functional Assessment Tool Used 9 hole peg, dynamometer   Functional Limitation Carrying, moving and handling objects   Carrying, Moving and Handling Objects Current Status [id number]) At least 40 percent but less than 60 percent impaired, limited or restricted   Carrying, Moving and Handling Objects Goal Status [id number]) At least 20 percent but less than 40 percent impaired, limited or restricted      Problem List Patient Active Problem List   Diagnosis Date Noted  . Thalamic hemorrhage ([place])   . ICH (intracerebral hemorrhage) ([place]) 02/01/2015  . Abdominal pain 01/25/2015  . Acute hepatitis   . Enteritis   . Ileus ([place])   . Uncontrollable vomiting     Quay Burow, OTR/L 03-01-2015, 2:56 PM  [facility] [address] [place], [place], [address] Phone: [phone]  Fax:  [phone]  Name: JAQUAIL KLUESNER MRN: [id number] Date of Birth: 1940-11-26

## 2015-02-17 ENCOUNTER — Ambulatory Visit: Payer: Medicare Other | Admitting: Rehabilitation

## 2015-02-17 ENCOUNTER — Encounter: Payer: Self-pay | Admitting: Occupational Therapy

## 2015-02-17 ENCOUNTER — Encounter: Payer: Self-pay | Admitting: Rehabilitation

## 2015-02-17 ENCOUNTER — Ambulatory Visit: Payer: Medicare Other | Admitting: Occupational Therapy

## 2015-02-17 VITALS — BP 138/63

## 2015-02-17 DIAGNOSIS — R2681 Unsteadiness on feet: Secondary | ICD-10-CM

## 2015-02-17 DIAGNOSIS — R201 Hypoesthesia of skin: Secondary | ICD-10-CM

## 2015-02-17 DIAGNOSIS — IMO0002 Reserved for concepts with insufficient information to code with codable children: Secondary | ICD-10-CM

## 2015-02-17 DIAGNOSIS — G8193 Hemiplegia, unspecified affecting right nondominant side: Secondary | ICD-10-CM

## 2015-02-17 DIAGNOSIS — I69898 Other sequelae of other cerebrovascular disease: Secondary | ICD-10-CM | POA: Diagnosis not present

## 2015-02-17 DIAGNOSIS — R269 Unspecified abnormalities of gait and mobility: Secondary | ICD-10-CM

## 2015-02-17 NOTE — Therapy (Signed)
[facility] [address] [place], [place], [address] Phone: [phone]   Fax:  [phone]  Physical Therapy Treatment  Patient Details  Name: Todd [name] MRN: [id number] Date of Birth: [birth year] Referring Provider: Burnetta Sabin, NP  Encounter Date: 02/17/2015      PT End of Session - 02/17/15 1322    Visit Number 2   Number of Visits 34  may transition if needed to [facility] OP after 4 weeks   Date for PT Re-Evaluation 04/10/15   Authorization Type UHC MCR-Gcodes on every 10th visit.    PT Start Time 1315   PT Stop Time 1400   PT Time Calculation (min) 45 min   Activity Tolerance Patient limited by fatigue   Behavior During Therapy [facility] for tasks assessed/performed      Past Medical History  Diagnosis Date  . PONV (postoperative nausea and vomiting)   . Hyperlipidemia     takes Crestor daily  . Hypertension     takes Amlodipine and Metoprolol  . Coronary artery disease   . 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 Joint pain     knees,elbows,back  . Diabetes mellitus     lantus and novolog;fasting sugars run 70-120  . CKD (chronic kidney disease)     Past Surgical History  Procedure Laterality Date  . Knee arthroscopy  80's    right knee  . Cholecystectomy  20+yrs ago  . Cardiac catheterization  01/2006    2 stents placed  . Back surgery  09/15/10  . Cataract extraction  12/21/10--02/01/11    right and left  . Elbow surgery  2012    right elbow  . Colonoscopy    . Ulnar nerve transposition  03/02/2011    Procedure: ULNAR NERVE DECOMPRESSION/TRANSPOSITION;  Surgeon: Peggyann Shoals, MD;  Location: [facility];  Service: Neurosurgery;  Laterality: Left;  LEFT ulnar nerve decompression  . Total hip arthroplasty      There were no vitals filed for this visit.  Visit Diagnosis:  Unsteadiness  Abnormality of gait  Weakness due to cerebrovascular accident  Impaired sensation      Subjective  Assessment - 02/17/15 1320    Subjective Reports no changes since last visits, no falls.  He also states that he has been walking, but did not yesterday due to having an MD appt and walking a lot there."     Patient is accompained by: Family member   Limitations Standing;Walking;House hold activities   Patient Stated Goals to get back to being independent   Currently in Pain? No/denies              Gait:  Assessed gait with SPC indoors and outdoors today.  Performed 345' indoors around track and around mats/obstacles with use of SPC at S level.  Provided clearance for use of SPC at home only and on deck with S from wife.  Addressed outdoors gait x 500' with SPC on uneven paved surfaces as well as grassy and gravel surfaces.  Performed at min/guard to min A level with continued cues for sequencing and technique with SPC.  Continue to recommend that he use RW when in community and on paved surfaces, but would continue to address unlevel surfaces in therapy to ensure safety before performing at home.  Pt and wife verbalized understanding.    Therex:  Standing mini squats x 10 reps, standing hip abd x 10 reps, standing marching x 10 reps BLE  and sit<>stand with use of hands on lap for functional BLE strengthening x 5 reps from mat, however note marked difficulty therefore had pt perform from higher arm chair still pushing from lap only x 10 reps.  Cues for increased forward weight shift and decreased use of legs on back of chair to stabilize.  See pt instruction for further details.     Pt continues to require increased time for rest break due to decreased functional endurance.  Will continue to address during therapy sessions and therefore continue to recommend walking program to address this.                  PT Education - 02/17/15 1322    Education provided Yes   Education Details education on continued compliance with walking program and new HEP   Person(s) Educated  Patient;Spouse   Methods Explanation;Handout;Demonstration   Comprehension Verbalized understanding;Returned demonstration          PT Short Term Goals - 02/09/15 1319    PT SHORT TERM GOAL #1   Title Pt will initiate HEP for RLE strengthening and balance to indicate decreased fall risk and improved functional mobility.  (Target Date: 03/09/15)   PT SHORT TERM GOAL #2   Title Pt will perform 6MWT and increase score by 150' in order to indicate functional improvement in endurance. (Target Date: 03/09/15)   PT SHORT TERM GOAL #3   Title Pt will verbalize compliance with walking program to indicate improvement in functional endurance.  (Target Date: 03/09/15)   PT SHORT TERM GOAL #4   Title Pt will increase gait speed to 2.31 ft/sec in order to indicate decreased fall risk and more efficient gait.  (Target Date: 03/09/15)   PT SHORT TERM GOAL #5   Title Pt will increase DGI to 13/24 in order to indicate decreased fall risk and functional improvemnet in balance. (Target Date: 03/09/15)   Additional Short Term Goals   Additional Short Term Goals Yes   PT SHORT TERM GOAL #6   Title Pt will ambulate 300' on indoor surfaces with SPC at mod I level in order to indicate safe home negotiation.  (Target Date: 03/09/15)           PT Long Term Goals - 02/09/15 1331    PT LONG TERM GOAL #1   Title Pt will be independent with HEP in order to indicate improved RLE strength, balance and decreased fall risk. (Target Date: 04/06/15)   PT LONG TERM GOAL #2   Title Pt will increase 6MWT distance by 150' in order to indicate improvement in functional endurance.  (Target Date: 04/06/15)   PT LONG TERM GOAL #3   Title Pt will increase DGI to 20/24 in order to indicate decreased fall risk and functional improvement in balance. (Target Date: 04/06/15)   PT LONG TERM GOAL #4   Title Pt will verbalize return to community fitness/leisure activity to indicate return to prior level of function. (Target Date:  04/06/15)   PT LONG TERM GOAL #5   Title Pt will ambulate >500' on outdoor surfaces (curb and ramp included) without AD at mod I level in order to indicate safe community negotiation. (Target Date: 04/06/15)               Plan - 02/17/15 1425    Clinical Impression Statement Skilled session focused on assessment of gait with use of SPC.  Pt at S level with use of cane, therefore provided pt okay to ambulate in  home with S from wife and on deck, but should continue to use RW in community at this time.  Also provided pt with LE strengthening HEP, see pt instruction.     Pt will benefit from skilled therapeutic intervention in order to improve on the following deficits Abnormal gait;Decreased activity tolerance;Decreased balance;Decreased coordination;Decreased endurance;Decreased mobility;Decreased strength;Impaired perceived functional ability;Impaired sensation;Impaired UE functional use;Postural dysfunction   Rehab Potential Excellent   PT Frequency 2x / week   PT Duration 8 weeks  may transition to OP in [place] following 4 weeks   PT Treatment/Interventions ADLs/Self Care Home Management;Electrical Stimulation;DME Instruction;Gait training;Stair training;Functional mobility training;Therapeutic activities;Therapeutic exercise;Balance training;Neuromuscular re-education;Patient/family education;Orthotic Fit/Training;Visual/perceptual remediation/compensation   PT Next Visit Plan Perform 6MWT. Assess compliance with walking program, RLE strengthening, balance, gait with SPC on outdoor surfaces    PT Home Exercise Plan see pt instruction   Consulted and Agree with Plan of Care Patient;Family member/caregiver   Family Member Consulted wife        Problem List Patient Active Problem List   Diagnosis Date Noted  . Thalamic hemorrhage ([place])   . ICH (intracerebral hemorrhage) ([place]) 02/01/2015  . Abdominal pain 01/25/2015  . Acute hepatitis   . Enteritis   . Ileus ([place])   .  Uncontrollable vomiting     Cameron Sprang, PT, MPT [facility] [address] [place], [place], [address] Phone: [phone]   Fax:  [phone] 02/17/2015, 2:32 PM  Name: Todd [name] MRN: [id number] Date of Birth: 03-30-1940

## 2015-02-17 NOTE — Patient Instructions (Signed)
"  I love a Merchandiser, retail at counter top for support, march in place as high as you can.   Repeat _15___ times. Do __2__ sessions per day.  http://gt2.exer.us/344   Copyright  VHI. All rights reserved.   ABDUCTION: Standing (Active)    Facing counter top with light support on counter.  Stand, feet flat. Lift right leg out to side.  Complete _1__ sets of _10__ repetitions. Perform _2__ sessions per day.  http://gtsc.exer.us/110   Copyright  VHI. All rights reserved.   Knee [facility] a chair for balance or while facing counter top, slowly bend knees. Keep both feet on the floor.  Stick your bottom out and pretend as though you are sitting in a chair.  Repeat __10__ times. Do ___2_ sessions per day.  http://gt2.exer.us/261   Copyright  VHI. All rights reserved.   Functional Quadriceps: Sit to Stand    Sit on edge of chair, feet flat on floor. Stand upright, extending knees fully.  Scoot out to front of chair and do not let the back of your legs touch the chair.  Start with hands on lap.   Repeat _10___ times per set. Do _1___ sets per session. Do __10__ sessions per day.  http://orth.exer.us/734   Copyright  VHI. All rights reserved.

## 2015-02-17 NOTE — Therapy (Signed)
[facility] [address] [place], [place], [address] Phone: [phone]   Fax:  [phone]  Occupational Therapy Treatment  Patient Details  Name: Todd [name] MRN: [id number] Date of Birth: 04-05-1940 Referring Provider: Dr. Antony Contras  Encounter Date: 02/17/2015      OT End of Session - 02/17/15 1549    Visit Number 2   Number of Visits 16   Date for OT Re-Evaluation 04/12/15   Authorization Type UHC medicare   Authorization Time Period 60 days   OT Start Time 1403   OT Stop Time 1445   OT Time Calculation (min) 42 min   Activity Tolerance Patient tolerated treatment well   Behavior During Therapy [facility] for tasks assessed/performed      Past Medical History  Diagnosis Date  . PONV (postoperative nausea and vomiting)   . Hyperlipidemia     takes Crestor daily  . Hypertension     takes Amlodipine and Metoprolol  . Coronary artery disease   . 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 Joint pain     knees,elbows,back  . Diabetes mellitus     lantus and novolog;fasting sugars run 70-120  . CKD (chronic kidney disease)     Past Surgical History  Procedure Laterality Date  . Knee arthroscopy  80's    right knee  . Cholecystectomy  20+yrs ago  . Cardiac catheterization  01/2006    2 stents placed  . Back surgery  09/15/10  . Cataract extraction  12/21/10--02/01/11    right and left  . Elbow surgery  2012    right elbow  . Colonoscopy    . Ulnar nerve transposition  03/02/2011    Procedure: ULNAR NERVE DECOMPRESSION/TRANSPOSITION;  Surgeon: Peggyann Shoals, MD;  Location: [facility];  Service: Neurosurgery;  Laterality: Left;  LEFT ulnar nerve decompression  . Total hip arthroplasty      Filed Vitals:   02/17/15 1529 02/17/15 1530  BP: 114/50 138/63    Visit Diagnosis:  Unsteadiness  Impaired sensation  Weakness due to cerebrovascular accident  Lack of coordination due to stroke  Hemiplegia  affecting right nondominant side (HCC)      Subjective Assessment - 02/17/15 1530    Patient is accompained by: Family member   Pertinent History L Thalamic CVA, h/o of TKR   Patient Stated Goals Get back like I was - play golf, work do anything I want to   Pain Score 0-No pain                      OT Treatments/Exercises (OP) - 02/17/15 0001    ADLs   LB Dressing Patient reports he was able to don sock and tie shoes without assistance this am for the first time.   Bathing Patient stated that he wanted to be able to shower standing up.  Reviewed all short and long term goals with patient and wife.     ADL Comments At beginning of this session, patient pale, and reporting feeling"worn out"  Patient's wife concerned.  Patient seen by PT prior to OT session, and patient had worked 2 hours this am.  Patient's wife concerned about blood sugar - patient given crackers and peanut butter.  Patient's BP taken, see vitals.  Low BP, patient encouraged to lie down with legs elevated.  Patient reported light headedness.  After 10 minutes new vitals taken, patient much more responsive, coloring better - BP  coming up, see second vitals.  Patient able to continue with OT session after rest / snack.  Patient is significantly deconditioned.     Fine Motor Coordination   Fine Motor Coordination Stacking coins;Manipulating coins;Picking up coins;Dealing card with thumb;Flipping cards;Manipulation of small objects;Nuts and Bolts   Manipulation of small objects Patient unable to rotate stell balls in hand with either right or left hand.  Patient moving very quickly, and easily frustrated with this task    Flipping cards Patient able to complete with non dominant right hand - increased challenge to rotating each card full revolution with each card.    Dealing card with thumb Patient required cueing to reduce shoulder tension / overactivation during this task   Picking up coins Added to HEP    Manipulating coins Added to HEP   Stacking coins Added to HEP   Other Fine Motor Exercises Rotating pen in fingers and clicking pen.  In hand manipulation skills.                  OT Education - 02/17/15 1549    Education provided Yes   Education Details Fine motor coordination exercises   Person(s) Educated Patient;Spouse   Methods Explanation;Demonstration   Comprehension Verbalized understanding;Returned demonstration          OT Short Term Goals - 02/17/15 1552    OT SHORT TERM GOAL #1   Title Pt and wife will be mod I with HEP - 03/15/2015   Status On-going   OT SHORT TERM GOAL #2   Title Pt will demonstrate improved coordination in RUE as evidenced by decreasing time on 9 hole peg by at least 5 seconds (baseline = 48.22)   Status On-going   OT SHORT TERM GOAL #3   Title Pt will demonstrate improved grip strength by 5 pounds in LUE to assist in functional tasks (baseline= 55 pounds)   Status On-going   OT SHORT TERM GOAL #4   Title Pt will be mod I with shower transfers   Status On-going   OT SHORT TERM GOAL #5   Title Pt will be mod I with LB dressing   Status On-going           OT Long Term Goals - 02/17/15 1552    OT LONG TERM GOAL #1   Title Pt and wife will be mod I with upgraded HEP prn - 04/12/2015   Status On-going   OT LONG TERM GOAL #2   Title Pt will demonstrate improved coordination in RUE as evidenced by decreasing time on 9 hole peg test by 8 seconds (baseline=48.22)   Status On-going   OT LONG TERM GOAL #3   Title Pt will be able to lift 5 pound object off overhead shelf 3x with RUE.   Status On-going   OT LONG TERM GOAL #4   Title Pt will demonstrate improved grip strength by at least 7 pounds to assist in functional activities (baseline=55 pounds)   Status On-going   OT LONG TERM GOAL #5   Title Pt will be able to use RUE as non domnant hand for computer use for work    Status On-going               Plan - 02/17/15 1549     Clinical Impression Statement Reviewed short and long term goals with patient and wife.  Patient is eager to return to independence with ADL, and work related activities.  Patient with significant deconditioning following  recent stroke - yet showing desire for functional improvement.     Pt will benefit from skilled therapeutic intervention in order to improve on the following deficits (Retired) Decreased activity tolerance;Decreased balance;Decreased coordination;Decreased mobility;Decreased strength;Impaired UE functional use;Impaired sensation   Rehab Potential Good   OT Frequency 2x / week   OT Duration 8 weeks   OT Treatment/Interventions Self-care/ADL training;Moist Heat;Fluidtherapy;Ultrasound;Therapeutic exercise;Neuromuscular education;DME and/or AE instruction;Manual Therapy;Functional Mobility Training;Splinting;Therapeutic activities;Patient/family education;Balance training   Plan Give print out of fm coordination exercises, shower stall transfer, stand balance static to dynamic   Consulted and Agree with Plan of Care Patient;Family member/caregiver   Family Member Consulted wife Beverlee Nims        Problem List Patient Active Problem List   Diagnosis Date Noted  . Thalamic hemorrhage ([place])   . ICH (intracerebral hemorrhage) ([place]) 02/01/2015  . Abdominal pain 01/25/2015  . Acute hepatitis   . Enteritis   . Ileus ([place])   . Uncontrollable vomiting     Mariah Milling, OTR/L 02/17/2015, 3:54 PM  [facility] [address] [place], [place], [address] Phone: [phone]   Fax:  [phone]  Name: Todd [name] MRN: [id number] Date of Birth: 06-Dec-1940

## 2015-02-17 NOTE — Patient Instructions (Signed)
Fine Motor Coordination Exercises  Perform the following exercises 15 min a day, as recommended by your occupational therapist.   Pick up 5 small objects (coins, marbles, paperclips, beads, etc.) one at a time and hold them in hand, then place them one by one onto the table.  Pick up small objects and place them into a cup or container.  Play card games. Practice shuffling and dealing cards. Flip cards over onto table one by one.  Stack approximately [employer] (checkers, coins, etc.) onto table.  Practice lacing and tying a shoelace.

## 2015-02-19 ENCOUNTER — Ambulatory Visit: Payer: Medicare Other | Attending: Nurse Practitioner | Admitting: Physical Therapy

## 2015-02-19 VITALS — BP 152/69 | HR 60

## 2015-02-19 DIAGNOSIS — R4189 Other symptoms and signs involving cognitive functions and awareness: Secondary | ICD-10-CM | POA: Insufficient documentation

## 2015-02-19 DIAGNOSIS — I6980 Unspecified sequelae of other cerebrovascular disease: Secondary | ICD-10-CM | POA: Insufficient documentation

## 2015-02-19 DIAGNOSIS — R2681 Unsteadiness on feet: Secondary | ICD-10-CM | POA: Diagnosis present

## 2015-02-19 DIAGNOSIS — I69898 Other sequelae of other cerebrovascular disease: Secondary | ICD-10-CM | POA: Insufficient documentation

## 2015-02-19 DIAGNOSIS — R449 Unspecified symptoms and signs involving general sensations and perceptions: Secondary | ICD-10-CM

## 2015-02-19 DIAGNOSIS — R531 Weakness: Secondary | ICD-10-CM | POA: Insufficient documentation

## 2015-02-19 DIAGNOSIS — R201 Hypoesthesia of skin: Secondary | ICD-10-CM | POA: Insufficient documentation

## 2015-02-19 DIAGNOSIS — R279 Unspecified lack of coordination: Secondary | ICD-10-CM | POA: Insufficient documentation

## 2015-02-19 DIAGNOSIS — Z7409 Other reduced mobility: Secondary | ICD-10-CM | POA: Insufficient documentation

## 2015-02-19 DIAGNOSIS — G8193 Hemiplegia, unspecified affecting right nondominant side: Secondary | ICD-10-CM

## 2015-02-19 DIAGNOSIS — R269 Unspecified abnormalities of gait and mobility: Secondary | ICD-10-CM | POA: Diagnosis present

## 2015-02-19 NOTE — Patient Instructions (Addendum)
"  I love a Merchandiser, retail at counter top for support, march in place as high as you can.  Repeat _15___ times. Do __2__ sessions per day.  http://gt2.exer.us/344   Copyright  VHI. All rights reserved.   ABDUCTION: Standing (Active)    Facing counter top with light support on counter. Stand, feet flat. Lift right leg out to side.  Complete _1__ sets of _10__ repetitions. Perform _2__ sessions per day.  http://gtsc.exer.us/110   Copyright  VHI. All rights reserved.   Knee [facility] a chair for balance or while facing counter top, slowly bend knees. Keep both feet on the floor. Stick your bottom out and pretend as though you are sitting in a chair.  Repeat __10__ times. Do ___2_ sessions per day.  http://gt2.exer.us/261   Copyright  VHI. All rights reserved.   Functional Quadriceps: Sit to Stand    Sit on edge of chair, feet flat on floor. Stand upright, extending knees fully. Scoot out to front of chair and do not let the back of your legs touch the chair. Start with hands on lap.  Repeat _10___ times per set. Do _2_ sets per day.   * Make sure you breathe while you're performing these exercises ("exhale on exertion").

## 2015-02-20 NOTE — Therapy (Addendum)
[facility] [address] [place], [place], [address] Phone: [phone]   Fax:  [phone]  Physical Therapy Treatment  Patient Details  Name: Todd [name] MRN: [id number] Date of Birth: 1940-05-29 Referring Provider: Burnetta Sabin, NP  Encounter Date: 02/19/2015      PT End of Session - 02/20/15 2028    Visit Number 3   Number of Visits 10  may transition to [place] OP after 4 weeks   Date for PT Re-Evaluation 04/10/15   Authorization Type UHC MCR-Gcodes on every 10th visit.    PT Start Time 1021   PT Stop Time 1106   PT Time Calculation (min) 45 min   Activity Tolerance Patient limited by fatigue  frequent rest breaks required   Behavior During Therapy [facility] for tasks assessed/performed      Past Medical History  Diagnosis Date  . PONV (postoperative nausea and vomiting)   . Hyperlipidemia     takes Crestor daily  . Hypertension     takes Amlodipine and Metoprolol  . Coronary artery disease   . 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 Joint pain     knees,elbows,back  . Diabetes mellitus     lantus and novolog;fasting sugars run 70-120  . CKD (chronic kidney disease)     Past Surgical History  Procedure Laterality Date  . Knee arthroscopy  80's    right knee  . Cholecystectomy  20+yrs ago  . Cardiac catheterization  01/2006    2 stents placed  . Back surgery  09/15/10  . Cataract extraction  12/21/10--02/01/11    right and left  . Elbow surgery  2012    right elbow  . Colonoscopy    . Ulnar nerve transposition  03/02/2011    Procedure: ULNAR NERVE DECOMPRESSION/TRANSPOSITION;  Surgeon: Peggyann Shoals, MD;  Location: [facility];  Service: Neurosurgery;  Laterality: Left;  LEFT ulnar nerve decompression  . Total hip arthroplasty      Filed Vitals:   02/19/15 1027 02/19/15 1031  BP: 166/78 152/69  Pulse: 65 60    Visit Diagnosis:  Hemiplegia affecting right nondominant side  (HCC)  Abnormality of gait  Unsteadiness  Sensory deficit, right         02/19/15 0001  Ambulation/Gait  Ambulation/Gait Yes  Ambulation/Gait Assistance 5: Supervision  Ambulation/Gait Assistance Details Gait training x210' with SPC, pt exhibited effective between-session carryover of sequencing of SPC with gait. Required cueing for paced breathing during gait (inhalation for 3 steps, then exhalation during subsequent 3 steps) to ensure pt consistently breathing. Pt will requre reinforcement. Also required cueing for RUE reciprocal swing.  Ambulation Distance (Feet) [phone] Feet (891' (on 6MWT) + 210' then 100')  Assistive device Rolling walker;Straight cane  Gait Pattern Step-through pattern;Decreased arm swing - right;Trendelenburg;Decreased weight shift to right;Decreased dorsiflexion - right;Right flexed knee in stance;Trunk flexed;Wide base of support  Ambulation Surface Level;Indoor  Exercises  Exercises Other Exercises  Other Exercises  Reviewed home exercises provided durig previous session, with pt required min cueing for technique, verbal reminders for pt to breathe throughout exercisee. See Pt Instructions for details on all exercises, reps, and sets.                           PT Education - 02/20/15 2015    Education provided Yes   Education Details Paced breathing during ambulation and with home exercises.   Person(s) Educated  Patient;Spouse   Methods Explanation;Demonstration;Verbal cues   Comprehension Verbalized understanding;Need further instruction          PT Short Term Goals - 02/20/15 2037    PT SHORT TERM GOAL #1   Title Pt will initiate HEP for RLE strengthening and balance to indicate decreased fall risk and improved functional mobility.  (Target Date: 03/09/15)   Baseline Met 12/2.   Status Achieved   PT SHORT TERM GOAL #2   Title Pt will perform 6MWT and increase score by 150' in order to indicate functional improvement in  endurance. (Target Date: 03/09/15)   Baseline 12/2: baseline 6MWT distance = 891' with RW   PT SHORT TERM GOAL #3   Title Pt will verbalize compliance with walking program to indicate improvement in functional endurance.  (Target Date: 03/09/15)   PT SHORT TERM GOAL #4   Title Pt will increase gait speed to 2.31 ft/sec in order to indicate decreased fall risk and more efficient gait.  (Target Date: 03/09/15)   PT SHORT TERM GOAL #5   Title Pt will increase DGI to 13/24 in order to indicate decreased fall risk and functional improvemnet in balance. (Target Date: 03/09/15)   PT SHORT TERM GOAL #6   Title Pt will ambulate 300' on indoor surfaces with SPC at mod I level in order to indicate safe home negotiation.  (Target Date: 03/09/15)           PT Long Term Goals - 02/09/15 1331    PT LONG TERM GOAL #1   Title Pt will be independent with HEP in order to indicate improved RLE strength, balance and decreased fall risk. (Target Date: 04/06/15)   PT LONG TERM GOAL #2   Title Pt will increase 6MWT distance by 150' in order to indicate improvement in functional endurance.  (Target Date: 04/06/15)   PT LONG TERM GOAL #3   Title Pt will increase DGI to 20/24 in order to indicate decreased fall risk and functional improvement in balance. (Target Date: 04/06/15)   PT LONG TERM GOAL #4   Title Pt will verbalize return to community fitness/leisure activity to indicate return to prior level of function. (Target Date: 04/06/15)   PT LONG TERM GOAL #5   Title Pt will ambulate >500' on outdoor surfaces (curb and ramp included) without AD at mod I level in order to indicate safe community negotiation. (Target Date: 04/06/15)               Plan - 02/20/15 2028    Clinical Impression Statement Session focused on increasing functional endurance and gait training with SPC. 6MWT distance was 891', suggesting limited functional endurance. Cueing during gait training and exercises focused on consistent,  paced breathing due to pt tendency to hold breath. Wife expressing concern that pt worked too hard in prior PT session due to significant pt fatigue post-session; pt not in agreement with wife's concerns. Encouraged pt/wife to continue to provide feedback to therapists regarding pt tolerance to therapy sessions.   Pt will benefit from skilled therapeutic intervention in order to improve on the following deficits Abnormal gait;Decreased activity tolerance;Decreased balance;Decreased coordination;Decreased endurance;Decreased mobility;Decreased strength;Impaired perceived functional ability;Impaired sensation;Impaired UE functional use;Postural dysfunction   Rehab Potential Excellent   PT Frequency 2x / week   PT Duration 8 weeks  may transition to [facility] OP following 4 weeks   PT Treatment/Interventions ADLs/Self Care Home Management;Electrical Stimulation;DME Instruction;Gait training;Stair training;Functional mobility training;Therapeutic activities;Therapeutic exercise;Balance training;Neuromuscular re-education;Patient/family education;Orthotic Fit/Training;Visual/perceptual remediation/compensation   PT  Next Visit Plan Continue RLE strengthening, balance, gait with SPC on outdoor surfaces. Cue pt to breathe consistently during gait and exercise.   Consulted and Agree with Plan of Care Patient;Family member/caregiver   Family Member Consulted wife        Problem List Patient Active Problem List   Diagnosis Date Noted  . Thalamic hemorrhage ([place])   . ICH (intracerebral hemorrhage) ([place]) 02/01/2015  . Abdominal pain 01/25/2015  . Acute hepatitis   . Enteritis   . Ileus ([place])   . Uncontrollable vomiting     Billie Ruddy, PT, DPT [facility] [address] [place], [place], [address] Phone: [phone]   Fax:  [phone] 02/20/2015, 8:39 PM   Name: Todd [name] MRN: [id number] Date of Birth: 1941/03/05

## 2015-02-23 ENCOUNTER — Ambulatory Visit: Payer: Medicare Other

## 2015-02-23 ENCOUNTER — Ambulatory Visit: Payer: Medicare Other | Admitting: Occupational Therapy

## 2015-02-23 ENCOUNTER — Encounter: Payer: Self-pay | Admitting: Occupational Therapy

## 2015-02-23 VITALS — BP 140/74

## 2015-02-23 DIAGNOSIS — G8193 Hemiplegia, unspecified affecting right nondominant side: Secondary | ICD-10-CM | POA: Diagnosis not present

## 2015-02-23 DIAGNOSIS — IMO0002 Reserved for concepts with insufficient information to code with codable children: Secondary | ICD-10-CM

## 2015-02-23 DIAGNOSIS — R4189 Other symptoms and signs involving cognitive functions and awareness: Secondary | ICD-10-CM

## 2015-02-23 DIAGNOSIS — R2681 Unsteadiness on feet: Secondary | ICD-10-CM

## 2015-02-23 DIAGNOSIS — R201 Hypoesthesia of skin: Secondary | ICD-10-CM

## 2015-02-23 DIAGNOSIS — R449 Unspecified symptoms and signs involving general sensations and perceptions: Secondary | ICD-10-CM

## 2015-02-23 DIAGNOSIS — Z7409 Other reduced mobility: Secondary | ICD-10-CM

## 2015-02-23 DIAGNOSIS — R269 Unspecified abnormalities of gait and mobility: Secondary | ICD-10-CM

## 2015-02-23 NOTE — Patient Instructions (Addendum)
Install 2 grab bars in shower to increase safety and independence.  Place an 18 inch vertical grab bar as you enter the shower.  Place a second 30 inch grab bar on the back wall on a diagonal.  Have Jatinder stand next to the shower to determine where to put the grab bar for entrance into shower.  Then have Hersh sit in the shower to determine placement of the lower end of the inside of the grab bar. Once you know this have him stand to determine the upper end of the diagonal bar.  Have a professional install the grab bar so that you ensure it goes into the studs in the wall for safety.   Theraputty exercises:  Using green color putty:  Do these 1-2 times per day. STOP if you get pain and let your therapist know.  1. Make a ball     Make a pancake     Make a cone Do this sequence 3-5  times  2. Make a fat hot dog. Squeeze as hard as you can. Do this 10 times

## 2015-02-23 NOTE — Therapy (Signed)
[facility] [address] [place], [place], [address] Phone: [phone]   Fax:  [phone]  Physical Therapy Treatment  Patient Details  Name: Todd [name] MRN: [id number] Date of Birth: [birth year] Referring Provider: Burnetta Sabin, NP  Encounter Date: 02/23/2015      PT End of Session - 02/23/15 1930    Visit Number 4   Number of Visits 17   Date for PT Re-Evaluation 04/10/15   Authorization Type UHC MCR-Gcodes on every 10th visit.    PT Start Time 1402   PT Stop Time 1445   PT Time Calculation (min) 43 min   Equipment Utilized During Treatment Gait belt   Activity Tolerance Patient limited by fatigue   Behavior During Therapy [facility] for tasks assessed/performed      Past Medical History  Diagnosis Date  . PONV (postoperative nausea and vomiting)   . Hyperlipidemia     takes Crestor daily  . Hypertension     takes Amlodipine and Metoprolol  . Coronary artery disease   . 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 Joint pain     knees,elbows,back  . Diabetes mellitus     lantus and novolog;fasting sugars run 70-120  . CKD (chronic kidney disease)     Past Surgical History  Procedure Laterality Date  . Knee arthroscopy  80's    right knee  . Cholecystectomy  20+yrs ago  . Cardiac catheterization  01/2006    2 stents placed  . Back surgery  09/15/10  . Cataract extraction  12/21/10--02/01/11    right and left  . Elbow surgery  2012    right elbow  . Colonoscopy    . Ulnar nerve transposition  03/02/2011    Procedure: ULNAR NERVE DECOMPRESSION/TRANSPOSITION;  Surgeon: Peggyann Shoals, MD;  Location: [facility]UR[facility] ORS;  Service: Neurosurgery;  Laterality: Left;  LEFT ulnar nerve decompression  . Total hip arthroplasty      There were no vitals filed for this visit.  Visit Diagnosis:  Abnormality of gait  Unsteadiness  Lack of coordination due to stroke      Subjective Assessment - 02/23/15 1405    Subjective Pt denied falls or changes since last visit.   Patient is accompained by: Family member  wife: [name]   Limitations Standing;Walking;House hold activities   Patient Stated Goals to get back to being independent   Currently in Pain? No/denies                         [employer] Adult PT Treatment/Exercise - 02/23/15 1406    Ambulation/Gait   Ambulation/Gait Yes   Ambulation/Gait Assistance 5: Supervision   Ambulation/Gait Assistance Details Pt amb. with and without performing head turns over even terrain with RW. Pt also amb. over red mat with and without SPC. Pt required increased assist during amb. over compliant surface, even with SPC. Cues for paced breathing, improved L step length and R heel strike during amb. over compliant surfaces.  cues to decr. R hand ext. and R elbow flexion during gait with SPC and no AD. Frequent rest breaks required 2/2 fatigue.   Ambulation Distance (Feet) --  230'x3 with RW, 8x7' over mat with and without SPC   Assistive device Rolling walker;Straight cane   Gait Pattern Step-through pattern;Decreased arm swing - right;Trendelenburg;Decreased weight shift to right;Decreased dorsiflexion - right;Right flexed knee in stance;Trunk flexed;Wide base of support   Ambulation Surface Level;Indoor  Balance   Balance Assessed Yes   Dynamic Standing Balance   Dynamic Standing - Balance Support No upper extremity supported   Dynamic Standing - Level of Assistance 4: Min assist;Other (comment)  min guard   Dynamic Standing - Balance Activities Alternating  foot traps;Lateral lean/weight shifting   Dynamic Standing - Comments Pt performed single and double cone taps with B LEs with min A required during 6 LOB episodes. Cues and demonstration to improve lateral wt. shifting, especially during sustained SLS (double cone taps). Pt performed 4x5cones/LE. Rest breaks required 2/2 fatigue.                PT Education - 02/23/15 1928     Education provided Yes   Education Details During OT session, pt asked if he could use the cane and OT asked for PT's input. PT educated pt that he experienced R hand extension and R elbow flexion and decr. stance time on R LE during amb. with SPC/no AD vs. safe and decr. gait deviations with RW. Therefore, PT adviced pt to use RW at all times, and to discuss with primary PT.   Person(s) Educated Patient;Spouse   Methods Explanation   Comprehension Verbalized understanding          PT Short Term Goals - 02/20/15 2037    PT SHORT TERM GOAL #1   Title Pt will initiate HEP for RLE strengthening and balance to indicate decreased fall risk and improved functional mobility.  (Target Date: 03/09/15)   Baseline Met 12/2.   Status Achieved   PT SHORT TERM GOAL #2   Title Pt will perform 6MWT and increase score by 150' in order to indicate functional improvement in endurance. (Target Date: 03/09/15)   Baseline 12/2: baseline 6MWT distance = 891' with RW   PT SHORT TERM GOAL #3   Title Pt will verbalize compliance with walking program to indicate improvement in functional endurance.  (Target Date: 03/09/15)   PT SHORT TERM GOAL #4   Title Pt will increase gait speed to 2.31 ft/sec in order to indicate decreased fall risk and more efficient gait.  (Target Date: 03/09/15)   PT SHORT TERM GOAL #5   Title Pt will increase DGI to 13/24 in order to indicate decreased fall risk and functional improvemnet in balance. (Target Date: 03/09/15)   PT SHORT TERM GOAL #6   Title Pt will ambulate 300' on indoor surfaces with SPC at mod I level in order to indicate safe home negotiation.  (Target Date: 03/09/15)           PT Long Term Goals - 02/09/15 1331    PT LONG TERM GOAL #1   Title Pt will be independent with HEP in order to indicate improved RLE strength, balance and decreased fall risk. (Target Date: 04/06/15)   PT LONG TERM GOAL #2   Title Pt will increase 6MWT distance by 150' in order to indicate  improvement in functional endurance.  (Target Date: 04/06/15)   PT LONG TERM GOAL #3   Title Pt will increase DGI to 20/24 in order to indicate decreased fall risk and functional improvement in balance. (Target Date: 04/06/15)   PT LONG TERM GOAL #4   Title Pt will verbalize return to community fitness/leisure activity to indicate return to prior level of function. (Target Date: 04/06/15)   PT LONG TERM GOAL #5   Title Pt will ambulate >500' on outdoor surfaces (curb and ramp included) without AD at mod I level in order  to indicate safe community negotiation. (Target Date: 04/06/15)               Plan - 02/23/15 1930    Clinical Impression Statement Pt continues to require frequent seated rest breaks during session 2/2 fatigue. Pt's SaO2 on room air remained >/=98% and HR between 65-73bpm during session. Pt noted to experience increased postural sway and incr. gait deviations during amb. without AD and with SPC vs. RW. Pt would continue to benefit from lateral wt. shifting (SLS) activities to improve R SLS time during amb. Continue with POC.   Pt will benefit from skilled therapeutic intervention in order to improve on the following deficits Abnormal gait;Decreased activity tolerance;Decreased balance;Decreased coordination;Decreased endurance;Decreased mobility;Decreased strength;Impaired perceived functional ability;Impaired sensation;Impaired UE functional use;Postural dysfunction   Rehab Potential Excellent   PT Frequency 2x / week   PT Duration 8 weeks   PT Treatment/Interventions ADLs/Self Care Home Management;Electrical Stimulation;DME Instruction;Gait training;Stair training;Functional mobility training;Therapeutic activities;Therapeutic exercise;Balance training;Neuromuscular re-education;Patient/family education;Orthotic Fit/Training;Visual/perceptual remediation/compensation   PT Next Visit Plan Continue RLE strengthening, balance (SLS/lateral wt. shifting), gait with SPC on  outdoor/compliant surfaces. Cue pt to breathe consistently during gait and exercise.   PT Home Exercise Plan see pt instruction   Consulted and Agree with Plan of Care Patient;Family member/caregiver   Family Member Consulted wife-[name]        Problem List Patient Active Problem List   Diagnosis Date Noted  . Thalamic hemorrhage ([place])   . ICH (intracerebral hemorrhage) ([place]) 02/01/2015  . Abdominal pain 01/25/2015  . Acute hepatitis   . Enteritis   . Ileus ([place])   . Uncontrollable vomiting     Alexcia Schools L 02/23/2015, 7:35 PM  [facility] [address] [place], [place], [address] Phone: [phone]   Fax:  [phone]  Name: Todd [name] MRN: [id number] Date of Birth: 11/25/1940    Geoffry Paradise, PT,DPT 02/23/2015 7:35 PM Phone: [phone] Fax: [phone]

## 2015-02-23 NOTE — Therapy (Signed)
[facility] [address] [place], [place], [address] Phone: [phone]   Fax:  [phone]  Occupational Therapy Treatment  Patient Details  Name: Todd [name] MRN: [id number] Date of Birth: 11/19/1940 Referring Provider: Dr. Antony Contras  Encounter Date: 02/23/2015      OT End of Session - 02/23/15 1646    Visit Number 3   Number of Visits 16   Date for OT Re-Evaluation 04/12/15   Authorization Type UHC medicare   Authorization Time Period 60 days   OT Start Time 1448   OT Stop Time 1530   OT Time Calculation (min) 42 min   Activity Tolerance Patient tolerated treatment well      Past Medical History  Diagnosis Date  . PONV (postoperative nausea and vomiting)   . Hyperlipidemia     takes Crestor daily  . Hypertension     takes Amlodipine and Metoprolol  . Coronary artery disease   . 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 Joint pain     knees,elbows,back  . Diabetes mellitus     lantus and novolog;fasting sugars run 70-120  . CKD (chronic kidney disease)     Past Surgical History  Procedure Laterality Date  . Knee arthroscopy  80's    right knee  . Cholecystectomy  20+yrs ago  . Cardiac catheterization  01/2006    2 stents placed  . Back surgery  09/15/10  . Cataract extraction  12/21/10--02/01/11    right and left  . Elbow surgery  2012    right elbow  . Colonoscopy    . Ulnar nerve transposition  03/02/2011    Procedure: ULNAR NERVE DECOMPRESSION/TRANSPOSITION;  Surgeon: Peggyann Shoals, MD;  Location: [facility]UR[facility];  Service: Neurosurgery;  Laterality: Left;  LEFT ulnar nerve decompression  . Total hip arthroplasty      Filed Vitals:   02/23/15 1448  BP: 140/74    Visit Diagnosis:  Hemiplegia affecting right nondominant side (HCC)  Sensory deficit, right  Impaired sensation  Weakness due to cerebrovascular accident  Lack of coordination due to stroke  Impaired functional mobility  and activity tolerance      Subjective Assessment - 02/23/15 1448    Subjective  I feel much better today vs last time.   Pertinent History L Thalamic CVA, h/o of TKR   Patient Stated Goals Get back like I was - play golf, work do anything I want to   Currently in Pain? No/denies                      OT Treatments/Exercises (OP) - 02/23/15 0001    ADLs   Functional Mobility Practiced shower transfers and problem solved with pt and wife to determine placement and size of grab bars as well as instructing wife in best way to guard while letting pt be as independent as possible. Wife able to return demonstrate and recommendations given in writing.    Exercises   Exercises Hand   Hand Exercises   Theraputty Flatten;Roll;Grip  green putty   Other Hand Exercises Pt issued green putty along with HEP for theraputty to address R hand strength.  See pt instructions for details of program.  Pt able to return demonstrate and verbalized understanding.                 OT Education - 02/23/15 1641    Education provided Yes   Education Details theraputty HEP  green, grab bars in bathroom   Person(s) Educated Patient;Spouse   Methods Explanation;Demonstration;Handout;Verbal cues   Comprehension Verbalized understanding;Returned demonstration          OT Short Term Goals - 02/23/15 1644    OT SHORT TERM GOAL #1   Title Pt and wife will be mod I with HEP - 03/15/2015   Status On-going   OT SHORT TERM GOAL #2   Title Pt will demonstrate improved coordination in RUE as evidenced by decreasing time on 9 hole peg by at least 5 seconds (baseline = 48.22)   Status On-going   OT SHORT TERM GOAL #3   Title Pt will demonstrate improved grip strength by 5 pounds in LUE to assist in functional tasks (baseline= 55 pounds)   Status On-going   OT SHORT TERM GOAL #4   Title Pt will be mod I with shower transfers   Status On-going   OT SHORT TERM GOAL #5   Title Pt will be mod I  with LB dressing   Status On-going           OT Long Term Goals - 02/23/15 1645    OT LONG TERM GOAL #1   Title Pt and wife will be mod I with upgraded HEP prn - 04/12/2015   Status On-going   OT LONG TERM GOAL #2   Title Pt will demonstrate improved coordination in RUE as evidenced by decreasing time on 9 hole peg test by 8 seconds (baseline=48.22)   Status On-going   OT LONG TERM GOAL #3   Title Pt will be able to lift 5 pound object off overhead shelf 3x with RUE.   Status On-going   OT LONG TERM GOAL #4   Title Pt will demonstrate improved grip strength by at least 7 pounds to assist in functional activities (baseline=55 pounds)   Status On-going   OT LONG TERM GOAL #5   Title Pt will be able to use RUE as non domnant hand for computer use for work    Status On-going               Plan - 02/23/15 1645    Clinical Impression Statement Pt making good progress toward goals. Pt easily frustrated and requires encouragement and support to stay focused on goals.    Pt will benefit from skilled therapeutic intervention in order to improve on the following deficits (Retired) Decreased activity tolerance;Decreased balance;Decreased coordination;Decreased mobility;Decreased strength;Impaired UE functional use;Impaired sensation   Rehab Potential Good   OT Frequency 2x / week   OT Duration 8 weeks   OT Treatment/Interventions Self-care/ADL training;Moist Heat;Fluidtherapy;Ultrasound;Therapeutic exercise;Neuromuscular education;DME and/or AE instruction;Manual Therapy;Functional Mobility Training;Splinting;Therapeutic activities;Patient/family education;Balance training   Plan check theraputty HEP, check grab bars, standing balance.   Consulted and Agree with Plan of Care Patient;Family member/caregiver   Family Member Consulted wife Beverlee Nims        Problem List Patient Active Problem List   Diagnosis Date Noted  . Thalamic hemorrhage ([place])   . ICH (intracerebral hemorrhage)  ([place]) 02/01/2015  . Abdominal pain 01/25/2015  . Acute hepatitis   . Enteritis   . Ileus ([place])   . Uncontrollable vomiting     Quay Burow, OTR/L 02/23/2015, 5:00 PM  [facility] [address] [place], [place], [address] Phone: [phone]   Fax:  [phone]  Name: Todd [name] MRN: [id number] Date of Birth: 01/31/1941

## 2015-02-26 ENCOUNTER — Encounter: Payer: Self-pay | Admitting: Occupational Therapy

## 2015-02-26 ENCOUNTER — Ambulatory Visit: Payer: Medicare Other | Admitting: Rehabilitation

## 2015-02-26 ENCOUNTER — Ambulatory Visit: Payer: Medicare Other | Admitting: Occupational Therapy

## 2015-02-26 ENCOUNTER — Encounter: Payer: Self-pay | Admitting: Rehabilitation

## 2015-02-26 DIAGNOSIS — R269 Unspecified abnormalities of gait and mobility: Secondary | ICD-10-CM

## 2015-02-26 DIAGNOSIS — IMO0002 Reserved for concepts with insufficient information to code with codable children: Secondary | ICD-10-CM

## 2015-02-26 DIAGNOSIS — R449 Unspecified symptoms and signs involving general sensations and perceptions: Secondary | ICD-10-CM

## 2015-02-26 DIAGNOSIS — G8193 Hemiplegia, unspecified affecting right nondominant side: Secondary | ICD-10-CM | POA: Diagnosis not present

## 2015-02-26 DIAGNOSIS — R2681 Unsteadiness on feet: Secondary | ICD-10-CM

## 2015-02-26 DIAGNOSIS — R4189 Other symptoms and signs involving cognitive functions and awareness: Secondary | ICD-10-CM

## 2015-02-26 NOTE — Therapy (Signed)
[facility] [address] [place], [place], [address] Phone: [phone]   Fax:  [phone]  Physical Therapy Treatment  Patient Details  Name: Todd [name] MRN: [id number] Date of Birth: [birth year] Referring Provider: Burnetta Sabin, NP  Encounter Date: 02/26/2015      PT End of Session - 02/26/15 1403    Visit Number 5   Number of Visits 17   Date for PT Re-Evaluation 04/10/15   Authorization Type UHC MCR-Gcodes on every 10th visit.    PT Start Time 1400   PT Stop Time 1445   PT Time Calculation (min) 45 min   Equipment Utilized During Treatment Gait belt   Activity Tolerance Patient limited by fatigue   Behavior During Therapy WFL for tasks assessed/performed      Past Medical History  Diagnosis Date  . PONV (postoperative nausea and vomiting)   . Hyperlipidemia     takes Crestor daily  . Hypertension     takes Amlodipine and Metoprolol  . Coronary artery disease   . 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 Joint pain     knees,elbows,back  . Diabetes mellitus     lantus and novolog;fasting sugars run 70-120  . CKD (chronic kidney disease)     Past Surgical History  Procedure Laterality Date  . Knee arthroscopy  80's    right knee  . Cholecystectomy  20+yrs ago  . Cardiac catheterization  01/2006    2 stents placed  . Back surgery  09/15/10  . Cataract extraction  12/21/10--02/01/11    right and left  . Elbow surgery  2012    right elbow  . Colonoscopy    . Ulnar nerve transposition  03/02/2011    Procedure: ULNAR NERVE DECOMPRESSION/TRANSPOSITION;  Surgeon: Peggyann Shoals, MD;  Location: [facility];  Service: Neurosurgery;  Laterality: Left;  LEFT ulnar nerve decompression  . Total hip arthroplasty      There were no vitals filed for this visit.  Visit Diagnosis:  Abnormality of gait  Unsteadiness  Weakness due to cerebrovascular accident      Subjective Assessment - 02/26/15 1402    Subjective Pt reports walking with cane at home regardless of advice from previous session therapist.     Patient is accompained by: Family member   Limitations Standing;Walking;House hold activities   Patient Stated Goals to get back to being independent   Currently in Pain? No/denies             NMR:  Addressed SLS, R lateral weight shift and balance during session.  Performed cone tapping and side stepping while on foam mat x 6 reps alternating LEs progressing to tipping cones over and back to upright position in order to increase amount of time in SLS.  Note increased difficulty when stepping LLE up to target due to imbalance and decreased strength in RLE.  Continued with this with activity in // bars stepping RLE to small step and advancing LLE all the way to chair seat with single UE support x 10 reps progressing to no UE support.  Note marked increase in difficulty, therefore lowered higher target for increased success.  Pt able to complete 5 more reps without UE support and min A for upright posture and increased R lateral weight shift during task.  Provided seated rest breaks during all tasks in order to decrease fatigue with cues for increased breathing during tasks.  Progressed from there to single UE  support while tapping LLE upwards and laterally to the L to unstable target, again to increase time spent in R SLS for NMR, WB and strengthening.  Tolerated well.  Ended NMR on mat while performing squats with use of mirror for increased visual feedback as pt tends to lean to the L to avoid R WB.  Pt did state he has some R knee pain as well which may be contributing to decreased WB.   Gait:  Ended session with gait with SPC x 230' at min A level.  Provided facilitation at R hand with therapists L hand (held pts hand) in order to increase amount of arm swing during gait.   Note marked improvement in gait fluidity and improved balance with improved arm swing, however pt unable to carryover  without HHA during gait.  Therefore recommend that pt continue to use RW at home for increased safety at this time.  Pt and wife verbalized understanding.                     PT Education - 02/26/15 1403    Education provided Yes   Education Details Education to ambulate at home with RW only for now due to unsteadiness   Person(s) Educated Patient;Spouse   Methods Explanation   Comprehension Verbalized understanding          PT Short Term Goals - 02/20/15 2037    PT SHORT TERM GOAL #1   Title Pt will initiate HEP for RLE strengthening and balance to indicate decreased fall risk and improved functional mobility.  (Target Date: 03/09/15)   Baseline Met 12/2.   Status Achieved   PT SHORT TERM GOAL #2   Title Pt will perform 6MWT and increase score by 150' in order to indicate functional improvement in endurance. (Target Date: 03/09/15)   Baseline 12/2: baseline 6MWT distance = 891' with RW   PT SHORT TERM GOAL #3   Title Pt will verbalize compliance with walking program to indicate improvement in functional endurance.  (Target Date: 03/09/15)   PT SHORT TERM GOAL #4   Title Pt will increase gait speed to 2.31 ft/sec in order to indicate decreased fall risk and more efficient gait.  (Target Date: 03/09/15)   PT SHORT TERM GOAL #5   Title Pt will increase DGI to 13/24 in order to indicate decreased fall risk and functional improvemnet in balance. (Target Date: 03/09/15)   PT SHORT TERM GOAL #6   Title Pt will ambulate 300' on indoor surfaces with SPC at mod I level in order to indicate safe home negotiation.  (Target Date: 03/09/15)           PT Long Term Goals - 02/09/15 1331    PT LONG TERM GOAL #1   Title Pt will be independent with HEP in order to indicate improved RLE strength, balance and decreased fall risk. (Target Date: 04/06/15)   PT LONG TERM GOAL #2   Title Pt will increase 6MWT distance by 150' in order to indicate improvement in functional endurance.   (Target Date: 04/06/15)   PT LONG TERM GOAL #3   Title Pt will increase DGI to 20/24 in order to indicate decreased fall risk and functional improvement in balance. (Target Date: 04/06/15)   PT LONG TERM GOAL #4   Title Pt will verbalize return to community fitness/leisure activity to indicate return to prior level of function. (Target Date: 04/06/15)   PT LONG TERM GOAL #5   Title Pt will  ambulate >500' on outdoor surfaces (curb and ramp included) without AD at mod I level in order to indicate safe community negotiation. (Target Date: 04/06/15)               Plan - 02/26/15 1404    Clinical Impression Statement Skilled session focused on R SLS activities for balance and RLE strengthening during session.  Also addressed more midline postures and gait with SPC.  Pt continues to be highly guarded with RUE during gait and unsteady, therefore recommend continued use of RW at home for now but would continue to work on gait with [facility] during therapy.     Pt will benefit from skilled therapeutic intervention in order to improve on the following deficits Abnormal gait;Decreased activity tolerance;Decreased balance;Decreased coordination;Decreased endurance;Decreased mobility;Decreased strength;Impaired perceived functional ability;Impaired sensation;Impaired UE functional use;Postural dysfunction   Rehab Potential Excellent   PT Frequency 2x / week   PT Duration 8 weeks   PT Treatment/Interventions ADLs/Self Care Home Management;Electrical Stimulation;DME Instruction;Gait training;Stair training;Functional mobility training;Therapeutic activities;Therapeutic exercise;Balance training;Neuromuscular re-education;Patient/family education;Orthotic Fit/Training;Visual/perceptual remediation/compensation   PT Next Visit Plan Continue RLE strengthening, balance (SLS/lateral wt. shifting), gait with SPC on outdoor/compliant surfaces. Cue pt to breathe consistently during gait and exercise.   PT Home Exercise Plan  see pt instruction   Consulted and Agree with Plan of Care Patient;Family member/caregiver   Family Member Consulted wife-[name]        Problem List Patient Active Problem List   Diagnosis Date Noted  . Thalamic hemorrhage ([place])   . ICH (intracerebral hemorrhage) ([place]) 02/01/2015  . Abdominal pain 01/25/2015  . Acute hepatitis   . Enteritis   . Ileus ([place])   . Uncontrollable vomiting    Cameron Sprang, PT, MPT [facility] [address] [place], [place], [address] Phone: [phone]   Fax:  [phone] 02/26/2015, 3:52 PM  Name: OZIAS DICENZO MRN: [id number] Date of Birth: 1940/03/22

## 2015-02-26 NOTE — Therapy (Signed)
[facility] [address] [place], [place], [address] Phone: [phone]   Fax:  [phone]  Occupational Therapy Treatment  Patient Details  Name: Todd [name] MRN: [id number] Date of Birth: 01/04/1941 Referring Provider: Dr. Antony Contras  Encounter Date: 02/26/2015      OT End of Session - 02/26/15 1701    Visit Number 4   Number of Visits 16   Date for OT Re-Evaluation 04/12/15   Authorization Type UHC medicare   Authorization Time Period 60 days   OT Start Time 1447   OT Stop Time 1531   OT Time Calculation (min) 44 min   Activity Tolerance Patient tolerated treatment well   Behavior During Therapy [facility] for tasks assessed/performed      Past Medical History  Diagnosis Date  . PONV (postoperative nausea and vomiting)   . Hyperlipidemia     takes Crestor daily  . Hypertension     takes Amlodipine and Metoprolol  . Coronary artery disease   . 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 Joint pain     knees,elbows,back  . Diabetes mellitus     lantus and novolog;fasting sugars run 70-120  . CKD (chronic kidney disease)     Past Surgical History  Procedure Laterality Date  . Knee arthroscopy  80's    right knee  . Cholecystectomy  20+yrs ago  . Cardiac catheterization  01/2006    2 stents placed  . Back surgery  09/15/10  . Cataract extraction  12/21/10--02/01/11    right and left  . Elbow surgery  2012    right elbow  . Colonoscopy    . Ulnar nerve transposition  03/02/2011    Procedure: ULNAR NERVE DECOMPRESSION/TRANSPOSITION;  Surgeon: Peggyann Shoals, MD;  Location: [facility];  Service: Neurosurgery;  Laterality: Left;  LEFT ulnar nerve decompression  . Total hip arthroplasty      There were no vitals filed for this visit.  Visit Diagnosis:  Hemiplegia affecting right nondominant side (HCC)  Sensory deficit, right  Lack of coordination due to stroke  Unsteadiness      Subjective  Assessment - 02/26/15 1654    Subjective  I have not been using the putty.  I have been practicing with my calculator though.   Patient is accompained by: Family member   Pertinent History L Thalamic CVA, h/o of TKR   Patient Stated Goals Get back like I was - play golf, work do anything I want to   Currently in Pain? No/denies   Pain Score 0-No pain                      OT Treatments/Exercises (OP) - 02/26/15 0001    ADLs   Bathing Patient stated that he has not yet had grab bars installed in shower.     Neurological Re-education Exercises   Other Exercises 1 Neuromuscular reeducation to address motor control in UE's and LE's through transitional movements.  Patient with tendency to use momentum i transitioning from sit to stand resulting in loss of balance, and knee locking in right leg.  Worked on graded control of sit to and from stand, and sustained motor activity in mid ranges of this transiton.  Patient with ineffective weight shift onto and off of right lower extremity, and posterior bias in standing.  With facilitation, patient better able to weight shift onto and off of right leg in standing.  Patient needed  cueing for right foot placement in each transition - decreased sensation right lower leg.     Other Exercises 2 Dynamic stand balance to address retrieving light weight objects from floor with one hand then two hands, with head turns and trunk rotation in standing.  Patient able to toss and catch ball using both hands without significant loss of balance with only intermittent assistance.                  OT Education - 02/26/15 1701    Education provided Yes   Education Details Foot palcement for sit to stand, controlled sit to stand (versus momentum)   Person(s) Educated Patient;Spouse   Methods Explanation;Demonstration   Comprehension Verbalized understanding;Need further instruction          OT Short Term Goals - 02/23/15 1644    OT SHORT TERM  GOAL #1   Title Pt and wife will be mod I with HEP - 03/15/2015   Status On-going   OT SHORT TERM GOAL #2   Title Pt will demonstrate improved coordination in RUE as evidenced by decreasing time on 9 hole peg by at least 5 seconds (baseline = 48.22)   Status On-going   OT SHORT TERM GOAL #3   Title Pt will demonstrate improved grip strength by 5 pounds in LUE to assist in functional tasks (baseline= 55 pounds)   Status On-going   OT SHORT TERM GOAL #4   Title Pt will be mod I with shower transfers   Status On-going   OT SHORT TERM GOAL #5   Title Pt will be mod I with LB dressing   Status On-going           OT Long Term Goals - 02/23/15 1645    OT LONG TERM GOAL #1   Title Pt and wife will be mod I with upgraded HEP prn - 04/12/2015   Status On-going   OT LONG TERM GOAL #2   Title Pt will demonstrate improved coordination in RUE as evidenced by decreasing time on 9 hole peg test by 8 seconds (baseline=48.22)   Status On-going   OT LONG TERM GOAL #3   Title Pt will be able to lift 5 pound object off overhead shelf 3x with RUE.   Status On-going   OT LONG TERM GOAL #4   Title Pt will demonstrate improved grip strength by at least 7 pounds to assist in functional activities (baseline=55 pounds)   Status On-going   OT LONG TERM GOAL #5   Title Pt will be able to use RUE as non domnant hand for computer use for work    Status On-going               Plan - 02/26/15 1702    Clinical Impression Statement Patient is making nice progress toward his OT goals.  Patient is slowly increasing his activity tolerance in home and at work.     Pt will benefit from skilled therapeutic intervention in order to improve on the following deficits (Retired) Decreased activity tolerance;Decreased balance;Decreased coordination;Decreased mobility;Decreased strength;Impaired UE functional use;Impaired sensation   Rehab Potential Good   OT Frequency 2x / week   OT Duration 8 weeks   OT  Treatment/Interventions Self-care/ADL training;Moist Heat;Fluidtherapy;Ultrasound;Therapeutic exercise;Neuromuscular education;DME and/or AE instruction;Manual Therapy;Functional Mobility Training;Splinting;Therapeutic activities;Patient/family education;Balance training   Plan stand balance, coordination - right side   Consulted and Agree with Plan of Care Patient;Family member/caregiver   Family Member Consulted wife Beverlee Nims  Problem List Patient Active Problem List   Diagnosis Date Noted  . Thalamic hemorrhage ([place])   . ICH (intracerebral hemorrhage) ([place]) 02/01/2015  . Abdominal pain 01/25/2015  . Acute hepatitis   . Enteritis   . Ileus ([place])   . Uncontrollable vomiting     Mariah Milling, OTR/L 02/26/2015, 5:04 PM  [facility] [address] [place], [place], [address] Phone: [phone]   Fax:  [phone]  Name: Todd [name] MRN: [id number] Date of Birth: 1940/07/07

## 2015-03-02 ENCOUNTER — Encounter: Payer: Self-pay | Admitting: Occupational Therapy

## 2015-03-02 ENCOUNTER — Ambulatory Visit: Payer: Medicare Other | Admitting: Physical Therapy

## 2015-03-02 ENCOUNTER — Ambulatory Visit: Payer: Medicare Other | Admitting: Occupational Therapy

## 2015-03-02 VITALS — BP 148/70 | HR 61

## 2015-03-02 DIAGNOSIS — IMO0002 Reserved for concepts with insufficient information to code with codable children: Secondary | ICD-10-CM

## 2015-03-02 DIAGNOSIS — G8193 Hemiplegia, unspecified affecting right nondominant side: Secondary | ICD-10-CM

## 2015-03-02 DIAGNOSIS — R2681 Unsteadiness on feet: Secondary | ICD-10-CM

## 2015-03-02 DIAGNOSIS — R269 Unspecified abnormalities of gait and mobility: Secondary | ICD-10-CM

## 2015-03-02 DIAGNOSIS — R201 Hypoesthesia of skin: Secondary | ICD-10-CM

## 2015-03-02 DIAGNOSIS — Z7409 Other reduced mobility: Secondary | ICD-10-CM

## 2015-03-02 NOTE — Therapy (Signed)
[facility] [address] [place], [place], [address] Phone: [phone]   Fax:  [phone]  Occupational Therapy Treatment  Patient Details  Name: Todd [name] MRN: [id number] Date of Birth: [birth year] Referring Provider: Dr. Antony Contras  Encounter Date: 03/02/2015      OT End of Session - 03/02/15 1702    Visit Number 5   Number of Visits 16   Date for OT Re-Evaluation 04/12/15   Authorization Type UHC medicare   Authorization Time Period 60 days   OT Start Time 1446   OT Stop Time 1528   OT Time Calculation (min) 42 min   Activity Tolerance Patient tolerated treatment well      Past Medical History  Diagnosis Date  . PONV (postoperative nausea and vomiting)   . Hyperlipidemia     takes Crestor daily  . Hypertension     takes Amlodipine and Metoprolol  . Coronary artery disease   . 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 Joint pain     knees,elbows,back  . Diabetes mellitus     lantus and novolog;fasting sugars run 70-120  . CKD (chronic kidney disease)     Past Surgical History  Procedure Laterality Date  . Knee arthroscopy  80's    right knee  . Cholecystectomy  20+yrs ago  . Cardiac catheterization  01/2006    2 stents placed  . Back surgery  09/15/10  . Cataract extraction  12/21/10--02/01/11    right and left  . Elbow surgery  2012    right elbow  . Colonoscopy    . Ulnar nerve transposition  03/02/2011    Procedure: ULNAR NERVE DECOMPRESSION/TRANSPOSITION;  Surgeon: Peggyann Shoals, MD;  Location: [facility];  Service: Neurosurgery;  Laterality: Left;  LEFT ulnar nerve decompression  . Total hip arthroplasty      There were no vitals filed for this visit.  Visit Diagnosis:  Hemiplegia affecting right nondominant side ([place])  Lack of coordination due to stroke  Impaired functional mobility and activity tolerance  Impaired sensation      Subjective Assessment - 03/02/15 1454    Subjective  I am really getting around   Patient is accompained by: Family member  wife   Pertinent History L Thalamic CVA, h/o of TKR   Patient Stated Goals Get back like I was - play golf, work do anything I want to   Currently in Pain? No/denies                      OT Treatments/Exercises (OP) - 03/02/15 0001    ADLs   ADL Comments Utilized leisure activity of golf (pt wishes to return to golf at some point) to address alignment, increased weight bearing on RLE, weight shifting, trunk control, dynamic standing balance  and coordinated movement of RUE with swing of golf club. Pt needs min guard and cues however improves with repetition.    Fine Motor Coordination   Other Fine Motor Exercises Therapeutic activities to address in hand manipulation, accuracy and then speed. Pt needs max encouragement to try and use R hand to manipulate objects vs using L hand to assist.  Pt needs increased time to manipulate objects. Pt is easily frustrated however responds to humor.                    OT Short Term Goals - 03/02/15 1700    OT SHORT TERM GOAL #  1   Title Pt and wife will be mod I with HEP - 03/15/2015   Status On-going   OT SHORT TERM GOAL #2   Title Pt will demonstrate improved coordination in RUE as evidenced by decreasing time on 9 hole peg by at least 5 seconds (baseline = 48.22)   Status On-going   OT SHORT TERM GOAL #3   Title Pt will demonstrate improved grip strength by 5 pounds in LUE to assist in functional tasks (baseline= 55 pounds)   Status On-going   OT SHORT TERM GOAL #4   Title Pt will be mod I with shower transfers   Status On-going   OT SHORT TERM GOAL #5   Title Pt will be mod I with LB dressing   Status On-going           OT Long Term Goals - 03/02/15 1700    OT LONG TERM GOAL #1   Title Pt and wife will be mod I with upgraded HEP prn - 04/12/2015   Status On-going   OT LONG TERM GOAL #2   Title Pt will demonstrate improved  coordination in RUE as evidenced by decreasing time on 9 hole peg test by 8 seconds (baseline=48.22)   Status On-going   OT LONG TERM GOAL #3   Title Pt will be able to lift 5 pound object off overhead shelf 3x with RUE.   Status On-going   OT LONG TERM GOAL #4   Title Pt will demonstrate improved grip strength by at least 7 pounds to assist in functional activities (baseline=55 pounds)   Status On-going   OT LONG TERM GOAL #5   Title Pt will be able to use RUE as non domnant hand for computer use for work    Status On-going               Plan - 03/02/15 1701    Clinical Impression Statement Pt is making good progress toward goals. Pt with improved functional mobility, coordination and use of RUE. Pt needs encouragement as he is easily frustrated.    Pt will benefit from skilled therapeutic intervention in order to improve on the following deficits (Retired) Decreased activity tolerance;Decreased balance;Decreased coordination;Decreased mobility;Decreased strength;Impaired UE functional use;Impaired sensation   Rehab Potential Good   OT Frequency 2x / week   OT Duration 8 weeks   OT Treatment/Interventions Self-care/ADL training;Moist Heat;Fluidtherapy;Ultrasound;Therapeutic exercise;Neuromuscular education;DME and/or AE instruction;Manual Therapy;Functional Mobility Training;Splinting;Therapeutic activities;Patient/family education;Balance training   Plan standing balance, coordination, functional use of R hand   Consulted and Agree with Plan of Care Patient;Family member/caregiver   Family Member Consulted wife Beverlee Nims        Problem List Patient Active Problem List   Diagnosis Date Noted  . Thalamic hemorrhage ([place])   . ICH (intracerebral hemorrhage) ([place]) 02/01/2015  . Abdominal pain 01/25/2015  . Acute hepatitis   . Enteritis   . Ileus ([place])   . Uncontrollable vomiting     Quay Burow, OTR/L 03/02/2015, 5:04 PM  [facility] [address] [place], [place], [address] Phone: [phone]   Fax:  [phone]  Name: Todd [name] MRN: [id number] Date of Birth: [birth year]

## 2015-03-02 NOTE — Therapy (Signed)
[facility] [address] [place], [place], [address] Phone: [phone]   Fax:  [phone]  Physical Therapy Treatment  Patient Details  Name: Todd [name] MRN: [id number] Date of Birth: [birth year] Referring Provider: Burnetta Sabin, NP  Encounter Date: 03/02/2015      PT End of Session - 03/02/15 1649    Visit Number 6   Number of Visits 17   Date for PT Re-Evaluation 04/10/15   Authorization Type UHC MCR-Gcodes on every 10th visit.    PT Start Time 1533   PT Stop Time 1615   PT Time Calculation (min) 42 min   Activity Tolerance Patient limited by fatigue   Behavior During Therapy [facility] for tasks assessed/performed      Past Medical History  Diagnosis Date  . PONV (postoperative nausea and vomiting)   . Hyperlipidemia     takes Crestor daily  . Hypertension     takes Amlodipine and Metoprolol  . Coronary artery disease   . 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 Joint pain     knees,elbows,back  . Diabetes mellitus     lantus and novolog;fasting sugars run 70-120  . CKD (chronic kidney disease)     Past Surgical History  Procedure Laterality Date  . Knee arthroscopy  80's    right knee  . Cholecystectomy  20+yrs ago  . Cardiac catheterization  01/2006    2 stents placed  . Back surgery  09/15/10  . Cataract extraction  12/21/10--02/01/11    right and left  . Elbow surgery  2012    right elbow  . Colonoscopy    . Ulnar nerve transposition  03/02/2011    Procedure: ULNAR NERVE DECOMPRESSION/TRANSPOSITION;  Surgeon: Peggyann Shoals, MD;  Location: [place] [facility];  Service: Neurosurgery;  Laterality: Left;  LEFT ulnar nerve decompression  . Total hip arthroplasty      Filed Vitals:   03/02/15 1600  BP: 148/70  Pulse: 61    Visit Diagnosis:  Abnormality of gait  Hemiplegia affecting right nondominant side (HCC)  Unsteadiness      Subjective Assessment - 03/02/15 1535    Subjective Pt reports he  has not been walking with cane at home since last session, stating,"The last therapist took me off of it." Pt denies falls and reports no siginificant changes.    Patient is accompained by: Family member   Limitations Standing;Walking;House hold activities   Patient Stated Goals to get back to being independent   Currently in Pain? No/denies            [facility] PT Assessment - 03/02/15 0001    Ambulation/Gait   Ambulation/Gait Yes                     [facility] Adult PT Treatment/Exercise - 03/02/15 0001    Ambulation/Gait   Ambulation/Gait Assistance 5: Supervision;4: Min guard;4: Min assist   Ambulation/Gait Assistance Details Gait [phone]' with RW, x230' with SPC, and x230' without AD. Pt requested seated rest break after 230' due to fatigue.    Ambulation Distance (Feet) 575 Feet   Assistive device Rolling walker;Straight cane;None   Gait Pattern Step-through pattern;Decreased arm swing - right;Trendelenburg;Decreased weight shift to right;Decreased dorsiflexion - right;Right flexed knee in stance;Trunk flexed;Decreased arm swing - left;Lateral trunk lean to right;Decreased step length - left   Ambulation Surface Level;Indoor   Gait Comments During gait training, noted consistent R Trendelenburg gait pattern causing compensatory R  trunk shortening. After NMR interventions performed to increase R stance stability, cueing focused on increasing LLE step length to increase lateral weight shift to R side; verbal/tactile cueing also focused on consistent reciprocal arm swing.    Neuro Re-ed    Neuro Re-ed Details  Pt performed the following activities in tall kneeling to increase proximal stability/control, promote symmetrical LE WB: side stepping to L side x3 steps, LUE reaching outside BOS x6 reps, sidestepping x3 steps to R side, RUE anterolateral reaching outside BOS x6 reps. LUE forward/retro stepping without UE support x10 reps per direction with min guard, cueing for posture.  Cueing  focused on decreasing lateral trunk displacement. R half tall kneeling (R knee on mat table, L foot on floor), pt performed anterior reaching x12 reps with cueing to increase L gluteus medius/maximus activation.                  PT Short Term Goals - 02/20/15 2037    PT SHORT TERM GOAL #1   Title Pt will initiate HEP for RLE strengthening and balance to indicate decreased fall risk and improved functional mobility.  (Target Date: 03/09/15)   Baseline Met 12/2.   Status Achieved   PT SHORT TERM GOAL #2   Title Pt will perform 6MWT and increase score by 150' in order to indicate functional improvement in endurance. (Target Date: 03/09/15)   Baseline 12/2: baseline 6MWT distance = 891' with RW   PT SHORT TERM GOAL #3   Title Pt will verbalize compliance with walking program to indicate improvement in functional endurance.  (Target Date: 03/09/15)   PT SHORT TERM GOAL #4   Title Pt will increase gait speed to 2.31 ft/sec in order to indicate decreased fall risk and more efficient gait.  (Target Date: 03/09/15)   PT SHORT TERM GOAL #5   Title Pt will increase DGI to 13/24 in order to indicate decreased fall risk and functional improvemnet in balance. (Target Date: 03/09/15)   PT SHORT TERM GOAL #6   Title Pt will ambulate 300' on indoor surfaces with SPC at mod I level in order to indicate safe home negotiation.  (Target Date: 03/09/15)           PT Long Term Goals - 02/09/15 1331    PT LONG TERM GOAL #1   Title Pt will be independent with HEP in order to indicate improved RLE strength, balance and decreased fall risk. (Target Date: 04/06/15)   PT LONG TERM GOAL #2   Title Pt will increase 6MWT distance by 150' in order to indicate improvement in functional endurance.  (Target Date: 04/06/15)   PT LONG TERM GOAL #3   Title Pt will increase DGI to 20/24 in order to indicate decreased fall risk and functional improvement in balance. (Target Date: 04/06/15)   PT LONG TERM GOAL #4    Title Pt will verbalize return to community fitness/leisure activity to indicate return to prior level of function. (Target Date: 04/06/15)   PT LONG TERM GOAL #5   Title Pt will ambulate >500' on outdoor surfaces (curb and ramp included) without AD at mod I level in order to indicate safe community negotiation. (Target Date: 04/06/15)               Plan - 03/02/15 1650    Clinical Impression Statement Session focused on gait training, use of weightbearing/closed chain activities to increase trunk control during gait due to excessive lateral trunk movement. Noted within-session improvement in trunk  stability during gait. Pt continues to require frequent seated rest breaks due to fatigue. Vital signs grossly WNL.   Pt will benefit from skilled therapeutic intervention in order to improve on the following deficits Abnormal gait;Decreased activity tolerance;Decreased balance;Decreased coordination;Decreased endurance;Decreased mobility;Decreased strength;Impaired perceived functional ability;Impaired sensation;Impaired UE functional use;Postural dysfunction   Rehab Potential Excellent   PT Frequency 2x / week   PT Duration 8 weeks   PT Treatment/Interventions ADLs/Self Care Home Management;Electrical Stimulation;DME Instruction;Gait training;Stair training;Functional mobility training;Therapeutic activities;Therapeutic exercise;Balance training;Neuromuscular re-education;Patient/family education;Orthotic Fit/Training;Visual/perceptual remediation/compensation   PT Next Visit Plan Continue RLE strengthening, balance (SLS/lateral wt. shifting), gait with SPC on outdoor/compliant surfaces. Cue pt to breathe consistently during gait and exercise.   Consulted and Agree with Plan of Care Patient;Family member/caregiver   Family Member Consulted wife-[name]        Problem List Patient Active Problem List   Diagnosis Date Noted  . Thalamic hemorrhage ([place])   . ICH (intracerebral hemorrhage) ([place])  02/01/2015  . Abdominal pain 01/25/2015  . Acute hepatitis   . Enteritis   . Ileus ([place])   . Uncontrollable vomiting    Billie Ruddy, PT, DPT [facility] [address] [place], [place], [address] Phone: [phone]   Fax:  [phone] 03/02/2015, 4:57 PM   Name: Todd [name] MRN: [id number] Date of Birth: 1940-04-19

## 2015-03-05 ENCOUNTER — Ambulatory Visit: Payer: Medicare Other | Admitting: Occupational Therapy

## 2015-03-05 ENCOUNTER — Ambulatory Visit: Payer: Medicare Other | Admitting: Physical Therapy

## 2015-03-05 DIAGNOSIS — G8193 Hemiplegia, unspecified affecting right nondominant side: Secondary | ICD-10-CM

## 2015-03-05 DIAGNOSIS — R269 Unspecified abnormalities of gait and mobility: Secondary | ICD-10-CM

## 2015-03-05 DIAGNOSIS — R201 Hypoesthesia of skin: Secondary | ICD-10-CM

## 2015-03-05 DIAGNOSIS — R2681 Unsteadiness on feet: Secondary | ICD-10-CM

## 2015-03-05 DIAGNOSIS — IMO0002 Reserved for concepts with insufficient information to code with codable children: Secondary | ICD-10-CM

## 2015-03-05 DIAGNOSIS — Z7409 Other reduced mobility: Secondary | ICD-10-CM

## 2015-03-05 NOTE — Patient Instructions (Addendum)
Quads / HF, Supine LEFT    Lie near edge of bed, one leg bent, foot flat on bed. Other leg hanging over edge, relaxed, Bend LEFT knee backward keeping thigh in contact with bed. Hold _60__ seconds, twice per day on the LEFT leg.   Abduction: Clam (Eccentric) - Side-Lying - LEFT    Lie on side with knees bent. Lift top knee, keeping feet together. Keep trunk steady. Slowly lower for 3-5 seconds. Perform 5 reps, 4 times per day on the LEFT side only.   http://ecce.exer.us/64   Abduction- RIGHT    Lift RIGHT leg up toward ceiling. Relax. Repeat. Make sure you lead with you heel (you should feel this exercise in you "back pocket muscle"). Repeat _10___ times each leg. Do __2__ sessions per day.  http://gt2.exer.us/385   Copyright  VHI. All rights reserved.     Knee [place] a chair for balance or while facing counter top, slowly bend knees. Keep both feet on the floor. Stick your bottom out and pretend as though you are sitting in a chair.  Repeat __10__ times. Do ___2_ sessions per day.  http://gt2.exer.us/261   Copyright  VHI. All rights reserved.   Functional Quadriceps: Sit to Stand    Sit on edge of chair, feet flat on floor. Stand upright, extending knees fully. Scoot out to front of chair and do not let the back of your legs touch the chair. Start with hands on lap.  Repeat _10___ times per set. Do _2_ sets per day.   * Make sure you breathe while you're performing these exercises ("exhale on exertion").

## 2015-03-05 NOTE — Therapy (Signed)
[facility] [address] [place], [place], [address] Phone: [phone]   Fax:  [phone]  Occupational Therapy Treatment  Patient Details  Name: Todd [name] MRN: [id number] Date of Birth: [birth year] Referring Provider: Dr. Antony Contras  Encounter Date: 03/05/2015      OT End of Session - 03/05/15 1337    Visit Number 6   Number of Visits 16   Date for OT Re-Evaluation 04/12/15   Authorization Type UHC medicare   Authorization Time Period 60 days   OT Start Time 0848   OT Stop Time 0930   OT Time Calculation (min) 42 min   Activity Tolerance Patient tolerated treatment well   Behavior During Therapy [facility] for tasks assessed/performed      Past Medical History  Diagnosis Date  . PONV (postoperative nausea and vomiting)   . Hyperlipidemia     takes Crestor daily  . Hypertension     takes Amlodipine and Metoprolol  . Coronary artery disease   . 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 Joint pain     knees,elbows,back  . Diabetes mellitus     lantus and novolog;fasting sugars run 70-120  . CKD (chronic kidney disease)     Past Surgical History  Procedure Laterality Date  . Knee arthroscopy  80's    right knee  . Cholecystectomy  20+yrs ago  . Cardiac catheterization  01/2006    2 stents placed  . Back surgery  09/15/10  . Cataract extraction  12/21/10--02/01/11    right and left  . Elbow surgery  2012    right elbow  . Colonoscopy    . Ulnar nerve transposition  03/02/2011    Procedure: ULNAR NERVE DECOMPRESSION/TRANSPOSITION;  Surgeon: Peggyann Shoals, MD;  Location: [place] [facility];  Service: Neurosurgery;  Laterality: Left;  LEFT ulnar nerve decompression  . Total hip arthroplasty      There were no vitals filed for this visit.  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 Yes   Pain Score 2    Pain Location Elbow   Pain Descriptors / Indicators Aching   Pain Relieving Factors repositioning   Effect of Pain on Daily Activities cold temp temps, overuse        Treatment: standing to copy small peg design on vertical surface for increased fine motor coordination and standing balance,(pt completed 2/3 of design) min facilitation/ v.c.for posture and weightbearing through RLE.  Arm bike x 6 mins level 3 for conditioning. Tossing/ rotating a ball in RUE min difficulty,  then entering numbers into calculator with RUE with good accuracy                        OT Short Term Goals - 03/02/15 1700    OT SHORT TERM GOAL #1   Title Pt and wife will be mod I with HEP - 03/15/2015   Status On-going   OT SHORT TERM GOAL #2   Title Pt  will demonstrate improved coordination in RUE as evidenced by decreasing time on 9 hole peg by at least 5 seconds (baseline = 48.22)   Status On-going   OT SHORT TERM GOAL #3   Title Pt will demonstrate improved grip strength by 5 pounds in LUE to assist in functional tasks (baseline= 55 pounds)   Status On-going   OT SHORT TERM GOAL #4   Title Pt will be mod I with shower transfers   Status On-going   OT SHORT TERM GOAL #5   Title Pt will be mod I with LB dressing   Status On-going           OT Long Term Goals - 03/02/15 1700    OT LONG TERM GOAL #1   Title Pt and wife will be mod I with upgraded HEP prn - 04/12/2015   Status On-going   OT LONG TERM GOAL #2   Title Pt will demonstrate improved coordination in RUE as evidenced by decreasing time on 9 hole peg test by 8 seconds (baseline=48.22)   Status On-going   OT LONG TERM  GOAL #3   Title Pt will be able to lift 5 pound object off overhead shelf 3x with RUE.   Status On-going   OT LONG TERM GOAL #4   Title Pt will demonstrate improved grip strength by at least 7 pounds to assist in functional activities (baseline=55 pounds)   Status On-going   OT LONG TERM GOAL #5   Title Pt will be able to use RUE as non domnant hand for computer use for work    Status On-going               Plan - 03/05/15 1335    Clinical Impression Statement pt is progressing towards goals for coordination, strength and mobility.   Pt will benefit from skilled therapeutic intervention in order to improve on the following deficits (Retired) Decreased activity tolerance;Decreased balance;Decreased coordination;Decreased mobility;Decreased strength;Impaired UE functional use;Impaired sensation   Rehab Potential Good   OT Frequency 2x / week   OT Duration 8 weeks   OT Treatment/Interventions Self-care/ADL training;Moist Heat;Fluidtherapy;Ultrasound;Therapeutic exercise;Neuromuscular education;DME and/or AE instruction;Manual Therapy;Functional Mobility Training;Splinting;Therapeutic activities;Patient/family education;Balance training   Plan standing balance, functional use of right hand   Consulted and Agree with Plan of Care Patient;Family member/caregiver   Family Member Consulted wife Beverlee Nims        Problem List Patient Active Problem List   Diagnosis Date Noted  . Thalamic hemorrhage ([place])   . ICH (intracerebral hemorrhage) ([place]) 02/01/2015  . Abdominal pain 01/25/2015  . Acute hepatitis   . Enteritis   . Ileus ([place])   . Uncontrollable vomiting     Katherine Tout 03/05/2015, 1:38 PM Theone Murdoch, OTR/L [phone] Phone: [phone] 1:39 PM 03/05/2015 [facility] [address] [place], [place], [address] Phone: [phone]   Fax:  [phone]  Name: Todd [name] MRN: [id number] Date of  Birth: May 01, 1940

## 2015-03-06 NOTE — Therapy (Signed)
[facility] [address] [place], [place], [address] Phone: [phone]   Fax:  [phone]  Physical Therapy Treatment  Patient Details  Name: Todd [name] MRN: [id number] Date of Birth: 12-01-1940 Referring Provider: Burnetta Sabin, NP  Encounter Date: 03/05/2015      PT End of Session - 03/06/15 1346    Visit Number 7   Number of Visits 17   Date for PT Re-Evaluation 04/10/15   Authorization Type UHC MCR-Gcodes on every 10th visit.    PT Start Time [phone]   PT Stop Time 1020   PT Time Calculation (min) 47 min   Activity Tolerance Patient limited by fatigue  Pt continues to require frequent seated rest breaks   Behavior During Therapy [facility] for tasks assessed/performed      Past Medical History  Diagnosis Date  . PONV (postoperative nausea and vomiting)   . Hyperlipidemia     takes Crestor daily  . Hypertension     takes Amlodipine and Metoprolol  . Coronary artery disease   . 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 Joint pain     knees,elbows,back  . Diabetes mellitus     lantus and novolog;fasting sugars run 70-120  . CKD (chronic kidney disease)     Past Surgical History  Procedure Laterality Date  . Knee arthroscopy  80's    right knee  . Cholecystectomy  20+yrs ago  . Cardiac catheterization  01/2006    2 stents placed  . Back surgery  09/15/10  . Cataract extraction  12/21/10--02/01/11    right and left  . Elbow surgery  2012    right elbow  . Colonoscopy    . Ulnar nerve transposition  03/02/2011    Procedure: ULNAR NERVE DECOMPRESSION/TRANSPOSITION;  Surgeon: Peggyann Shoals, MD;  Location: [place] [facility];  Service: Neurosurgery;  Laterality: Left;  LEFT ulnar nerve decompression  . Total hip arthroplasty      There were no vitals filed for this visit.  Visit Diagnosis:  Abnormality of gait  Unsteadiness      Subjective Assessment - 03/05/15 0936    Subjective Pt would like to go to his  grandaughter's play this weekend, but will not do so unless PT clears him to use cane outdoors. Pt/wife expressing desire to remain at current PT clinic rather than transitioning to [facility] after 4 weeks.   Patient is accompained by: Family member   Limitations Standing;Walking;House hold activities   Patient Stated Goals to get back to being independent   Currently in Pain? No/denies            [facility] PT Assessment - 03/06/15 0001    Strength   Overall Strength Comments Hip ABD 4/5 on R, 4-/5 on L (although difficult to fully assess due to limited L hip flexor extensibility)   Flexibility   Soft Tissue Assessment /Muscle Length yes   Quadriceps Marcello Moores Test reveals limited extensibility of L ilipsoas and L rectus femoris.                     [facility] Adult PT Treatment/Exercise - 03/06/15 0001    Ambulation/Gait   Ambulation/Gait Yes   Ambulation/Gait Assistance 6: Modified independent (Device/Increase time);4: Min guard   Ambulation/Gait Assistance Details Gait [phone] without AD with min guard, cueing for coordination of reciprocal arm swing then x230 with mod I using SPC. Noted less lateral trunk movement during all gait trials during this session,  as compared with previous sessions.   Ambulation Distance (Feet) 345 Feet   Assistive device Straight cane   Gait Pattern Step-through pattern;Trendelenburg;Decreased weight shift to right;Decreased dorsiflexion - right;Right flexed knee in stance;Trunk flexed;Lateral trunk lean to right;Decreased step length - left;Decreased arm swing - right  B arm swing present but ineffectively coordinated with gait   Ambulation Surface Level;Indoor   Exercises   Exercises Other Exercises   Other Exercises  Reviewed standing hip ABD home exercises and noted pt mostly utilizing TFL bilaterally. Therefore, tested B hip ABD strength (see assessment for details)  Supine, hook lying, performed contract-relax PNF (4 x3-sec holds each) alternating  with prolonged passive stretch (4 x30-sec holds each) of L iliopsoas and L rectus femoris. Educated pt on L hip flexor self-stretch with effective return demo x60 seconds (see Pt Instructions for details).  Pt performed R sidelying ABD x10 reps and L clamshell x5 reps (both added to HEP in place of standing hip ABD).                PT Education - 03/06/15 1334    Education provided Yes   Education Details Pt now safe to use cane for household mobility but recommending pt continue to use RW outdoors. HEP progressed/modified. See Pt Instructions.   Person(s) Educated Patient;Spouse   Methods Explanation;Demonstration;Tactile cues;Verbal cues;Handout   Comprehension Verbalized understanding;Returned demonstration          PT Short Term Goals - 02/20/15 2037    PT SHORT TERM GOAL #1   Title Pt will initiate HEP for RLE strengthening and balance to indicate decreased fall risk and improved functional mobility.  (Target Date: 03/09/15)   Baseline Met 12/2.   Status Achieved   PT SHORT TERM GOAL #2   Title Pt will perform 6MWT and increase score by 150' in order to indicate functional improvement in endurance. (Target Date: 03/09/15)   Baseline 12/2: baseline 6MWT distance = 891' with RW   PT SHORT TERM GOAL #3   Title Pt will verbalize compliance with walking program to indicate improvement in functional endurance.  (Target Date: 03/09/15)   PT SHORT TERM GOAL #4   Title Pt will increase gait speed to 2.31 ft/sec in order to indicate decreased fall risk and more efficient gait.  (Target Date: 03/09/15)   PT SHORT TERM GOAL #5   Title Pt will increase DGI to 13/24 in order to indicate decreased fall risk and functional improvemnet in balance. (Target Date: 03/09/15)   PT SHORT TERM GOAL #6   Title Pt will ambulate 300' on indoor surfaces with SPC at mod I level in order to indicate safe home negotiation.  (Target Date: 03/09/15)           PT Long Term Goals - 02/09/15 1331     PT LONG TERM GOAL #1   Title Pt will be independent with HEP in order to indicate improved RLE strength, balance and decreased fall risk. (Target Date: 04/06/15)   PT LONG TERM GOAL #2   Title Pt will increase 6MWT distance by 150' in order to indicate improvement in functional endurance.  (Target Date: 04/06/15)   PT LONG TERM GOAL #3   Title Pt will increase DGI to 20/24 in order to indicate decreased fall risk and functional improvement in balance. (Target Date: 04/06/15)   PT LONG TERM GOAL #4   Title Pt will verbalize return to community fitness/leisure activity to indicate return to prior level of function. (Target Date: 04/06/15)  PT LONG TERM GOAL #5   Title Pt will ambulate >500' on outdoor surfaces (curb and ramp included) without AD at mod I level in order to indicate safe community negotiation. (Target Date: 04/06/15)               Plan - 03/06/15 1347    Clinical Impression Statement Session continued to focus on promoting more normalized gait pattern to increase safety with mobility. Modified HEP to add hip flexor stretching and to emphasize B gluteus medius strengthening. Pt now safe to ambulate in home with [facility]; however, recommending pt continue to use RW for all mobility outside home. Pt/wife verbalized understanding.   Pt will benefit from skilled therapeutic intervention in order to improve on the following deficits Abnormal gait;Decreased activity tolerance;Decreased balance;Decreased coordination;Decreased endurance;Decreased mobility;Decreased strength;Impaired perceived functional ability;Impaired sensation;Impaired UE functional use;Postural dysfunction   Rehab Potential Excellent   PT Frequency 2x / week   PT Duration 8 weeks   PT Treatment/Interventions ADLs/Self Care Home Management;Electrical Stimulation;DME Instruction;Gait training;Stair training;Functional mobility training;Therapeutic activities;Therapeutic exercise;Balance training;Neuromuscular  re-education;Patient/family education;Orthotic Fit/Training;Visual/perceptual remediation/compensation   PT Next Visit Plan * Check STG's. Continue RLE strengthening, balance (SLS/lateral wt. shifting), gait with SPC on outdoor/compliant surfaces. Cue pt to breathe consistently during gait and exercise.   PT Home Exercise Plan See 12/16 Pt Instructions   Consulted and Agree with Plan of Care Patient;Family member/caregiver   Family Member Consulted wife-[name]        Problem List Patient Active Problem List   Diagnosis Date Noted  . Thalamic hemorrhage ([place])   . ICH (intracerebral hemorrhage) ([place]) 02/01/2015  . Abdominal pain 01/25/2015  . Acute hepatitis   . Enteritis   . Ileus ([place])   . Uncontrollable vomiting    Billie Ruddy, PT, DPT [facility] [address] [place], [place], [address] Phone: [phone]   Fax:  [phone] 03/06/2015, 1:53 PM   Name: Todd [name] MRN: [id number] Date of Birth: 1940/11/02

## 2015-03-08 ENCOUNTER — Encounter: Payer: Self-pay | Admitting: Occupational Therapy

## 2015-03-08 ENCOUNTER — Ambulatory Visit: Payer: Medicare Other | Admitting: Occupational Therapy

## 2015-03-08 ENCOUNTER — Encounter: Payer: Self-pay | Admitting: Physical Therapy

## 2015-03-08 ENCOUNTER — Ambulatory Visit: Payer: Medicare Other | Admitting: Physical Therapy

## 2015-03-08 DIAGNOSIS — Z7409 Other reduced mobility: Secondary | ICD-10-CM

## 2015-03-08 DIAGNOSIS — IMO0002 Reserved for concepts with insufficient information to code with codable children: Secondary | ICD-10-CM

## 2015-03-08 DIAGNOSIS — G8193 Hemiplegia, unspecified affecting right nondominant side: Secondary | ICD-10-CM

## 2015-03-08 DIAGNOSIS — R2681 Unsteadiness on feet: Secondary | ICD-10-CM

## 2015-03-08 DIAGNOSIS — R201 Hypoesthesia of skin: Secondary | ICD-10-CM

## 2015-03-08 DIAGNOSIS — R269 Unspecified abnormalities of gait and mobility: Secondary | ICD-10-CM

## 2015-03-08 NOTE — Therapy (Signed)
[facility] [address] [place], [place], [address] Phone: [phone]   Fax:  [phone]  Occupational Therapy Treatment  Patient Details  Name: Todd [name] MRN: [id number] Date of Birth: 12/20/1940 Referring Provider: Dr. Antony Contras  Encounter Date: 03/08/2015      OT End of Session - 03/08/15 1749    Visit Number 7   Number of Visits 16   Date for OT Re-Evaluation 04/12/15   Authorization Type U[employer] medicare   Authorization Time Period 60 days   Authorization - Visit Number 7   Authorization - Number of Visits 10   OT Start Time [id number]   OT Stop Time 1529   OT Time Calculation (min) 43 min   Activity Tolerance Patient tolerated treatment well      Past Medical History  Diagnosis Date  . PONV (postoperative nausea and vomiting)   . Hyperlipidemia     takes Crestor daily  . Hypertension     takes Amlodipine and Metoprolol  . Coronary artery disease   . 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 Joint pain     knees,elbows,back  . Diabetes mellitus     lantus and novolog;fasting sugars run 70-120  . CKD (chronic kidney disease)     Past Surgical History  Procedure Laterality Date  . Knee arthroscopy  80's    right knee  . Cholecystectomy  20+yrs ago  . Cardiac catheterization  01/2006    2 stents placed  . Back surgery  09/15/10  . Cataract extraction  12/21/10--02/01/11    right and left  . Elbow surgery  2012    right elbow  . Colonoscopy    . Ulnar nerve transposition  03/02/2011    Procedure: ULNAR NERVE DECOMPRESSION/TRANSPOSITION;  Surgeon: Peggyann Shoals, MD;  Location: [facility];  Service: Neurosurgery;  Laterality: Left;  LEFT ulnar nerve decompression  . Total hip arthroplasty      There were no vitals filed for this visit.  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   Patient is accompained by: Family member   Pertinent History L Thalamic CVA, h/o of TKR   Patient Stated Goals Get back like I was - play golf, work do anything I want to   Currently in Pain? Yes   Pain Score 2    Pain Location Elbow   Pain Orientation Right   Pain Descriptors / Indicators Aching   Pain Type Chronic pain   Pain Onset More than a month ago   Pain Frequency Intermittent   Aggravating Factors  overuse   Pain Relieving Factors I don't know what helps - i just sit down and rest when I get it. it is where I had my surgery                      OT Treatments/Exercises (OP) - 03/08/15 1743    ADLs   Functional Mobility Addresed functional mobility with functional tasks in [place] with emphasis on bilateral overhead reach to displace balance, manuevering into high and low cabinets, activiites that required signfficant weight shift without UE support, functional ambulation while carrying items as well as while completing coordination tasks with his R hand.  Pt with continued improvement in coordinaton, grip strength, functional use of R hand, and balance.    ADL Comments Checked STG's - see update                  OT Short Term Goals - 03/08/15 1746    OT SHORT TERM GOAL #1   Title Pt and wife will be mod I with HEP - 03/15/2015   Status Achieved   OT SHORT TERM GOAL #2   Title Pt will demonstrate improved coordination in RUE as evidenced by decreasing time on 9 hole peg by at least 5 seconds (baseline = 48.22)   Status Achieved  38.02   OT SHORT TERM GOAL #3   Title Pt will demonstrate improved grip strength by 5 pounds in LUE to assist in functional tasks (baseline= 55 pounds)   Status Achieved  64 pounds   OT SHORT TERM GOAL #4   Title Pt will be mod I with shower transfers   Status On-going   OT SHORT TERM GOAL #5    Title Pt will be mod I with LB dressing   Status Achieved           OT Long Term Goals - 03/08/15 1747    OT LONG TERM GOAL #1   Title Pt and wife will be mod I with upgraded HEP prn - 04/12/2015   Status On-going   OT LONG TERM GOAL #2   Title Pt will demonstrate improved coordination in RUE as evidenced by decreasing time on 9 hole peg test by 13 seconds (baseline=48.22)   Status Revised   OT LONG TERM GOAL #3   Title Pt will be able to lift 5 pound object off overhead shelf 3x with RUE.   Status On-going   OT LONG TERM GOAL #4   Title Pt will demonstrate improved grip strength by at least 15 pounds to assist in functional activities (baseline=55 pounds)   Status Revised   OT LONG TERM GOAL #5   Title Pt will be able to use RUE as non domnant hand for computer use for work    Status On-going               Plan - 03/08/15 1748    Clinical Impression Statement Pt is making excellent progress toward goals and revised two LTG's to reflect progress.   Pt will benefit from skilled therapeutic intervention in order to improve on the following deficits (Retired) Decreased activity tolerance;Decreased balance;Decreased coordination;Decreased mobility;Decreased strength;Impaired UE functional use;Impaired sensation   Rehab Potential Good   OT Frequency 2x / week   OT Duration 8 weeks   OT Treatment/Interventions Self-care/ADL training;Moist Heat;Fluidtherapy;Ultrasound;Therapeutic exercise;Neuromuscular education;DME and/or AE instruction;Manual Therapy;Functional Mobility Training;Splinting;Therapeutic activities;Patient/family education;Balance training   Plan funcitonal mobility, functional use of R hand especially in overhead tasks, coordination   Consulted and Agree with Plan of Care Patient;Family member/caregiver   Family Member Consulted wife Beverlee Nims        Problem List Patient Active Problem List   Diagnosis Date Noted  . Thalamic hemorrhage ([place])   . ICH  (intracerebral hemorrhage) ([place]) 02/01/2015  . Abdominal pain 01/25/2015  . Acute hepatitis   . Enteritis   . Ileus ([place])   . Uncontrollable vomiting     Quay Burow, OTR/L 03/08/2015, 5:50 PM  [facility] [address] [place], [place], [address] Phone: [phone]   Fax:  [phone]  Name: Todd [name] MRN: [id number] Date  of Birth: [birth year]

## 2015-03-09 ENCOUNTER — Encounter: Payer: Medicare Other | Admitting: Occupational Therapy

## 2015-03-09 ENCOUNTER — Ambulatory Visit: Payer: Medicare Other | Admitting: Physical Therapy

## 2015-03-10 NOTE — Therapy (Signed)
[facility] [address] [place], [place], [address] Phone: [phone]   Fax:  [phone]  Physical Therapy Treatment  Patient Details  Name: Todd [name] MRN: [id number] Date of Birth: [birth year] Referring Provider: Burnetta Sabin, NP  Encounter Date: 03/08/2015   03/08/15 1411  PT Visits / Re-Eval  Visit Number 8  Number of Visits 17  Date for PT Re-Evaluation 04/10/15  Authorization  Authorization Type UHC MCR-Gcodes on every 10th visit.   PT Time Calculation  PT Start Time 1403  PT Stop Time 1445  PT Time Calculation (min) 42 min  PT - End of Session  Equipment Utilized During Treatment Gait belt  Activity Tolerance Patient limited by fatigue;Patient tolerated treatment well (Pt continues to require frequent seated rest breaks)  Behavior During Therapy [facility] for tasks assessed/performed     Past Medical History  Diagnosis Date  . PONV (postoperative nausea and vomiting)   . Hyperlipidemia     takes Crestor daily  . Hypertension     takes Amlodipine and Metoprolol  . Coronary artery disease   . 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 Joint pain     knees,elbows,back  . Diabetes mellitus     lantus and novolog;fasting sugars run 70-120  . CKD (chronic kidney disease)     Past Surgical History  Procedure Laterality Date  . Knee arthroscopy  80's    right knee  . Cholecystectomy  20+yrs ago  . Cardiac catheterization  01/2006    2 stents placed  . Back surgery  09/15/10  . Cataract extraction  12/21/10--02/01/11    right and left  . Elbow surgery  2012    right elbow  . Colonoscopy    . Ulnar nerve transposition  03/02/2011    Procedure: ULNAR NERVE DECOMPRESSION/TRANSPOSITION;  Surgeon: Peggyann Shoals, MD;  Location: [facility];  Service: Neurosurgery;  Laterality: Left;  LEFT ulnar nerve decompression  . Total hip arthroplasty      There were no vitals filed for this visit.  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 No falls. Having some elbow pain "It's always been there".   Limitations Standing;Walking;House hold activities  Patient Stated Goals to get back to being independent  Pain Assessment  Currently in Pain? Yes  Pain Score 2  Pain Location Elbow  Pain Orientation Right  Pain Descriptors / Indicators Aching;Sore  Pain Type Chronic pain  Pain Onset More than a month ago  Pain Frequency Occasional  Aggravating Factors  increased use/weight bearing through the arm  Pain Relieving Factors repositioning         03/08/15 1424  Transfers  Transfers Sit to Stand;Stand to Sit  Sit to Stand 6: Modified independent (Device/Increase time)  Stand to Sit 6: Modified independent (Device/Increase time)  Ambulation/Gait  Ambulation/Gait Yes  Ambulation/Gait Assistance 6: Modified independent (Device/Increase time)  Ambulation/Gait Assistance Details occasional cues on posture and to ensure pt is breathing with gait; cues to keep walker closer with gait.                                            Ambulation Distance (Feet)  1273 Feet (x1 with RW; ~ 200 feet total with cane during DGI test )  Assistive device Rolling walker  Gait Pattern Step-through pattern;Trunk flexed  Ambulation Surface Level;Indoor  Gait velocity 13.06 sec's= 2.51 ft/sec with RW  6 Minute walk- Post Test  Modified Borg Scale for Dyspnea 2- Mild shortness of breath  6 minute walk test results   Aerobic Endurance Distance Walked 1186  Endurance additional comments pt with improved breath control with gait during testing today.  Dynamic Gait Index  Level Surface 2  Change in Gait Speed 2  Gait with Horizontal Head Turns 2  Gait with Vertical Head Turns 2  Gait and Pivot Turn 2  Step  Over Obstacle 2  Step Around Obstacles 2  Steps 1  Total Score 15  DGI comment: Used straight cane with testing today   Self care: Pt able to verbalize all exercises issued for HEP and current status with walking program.      PT Short Term Goals - 03/08/15 1412    PT SHORT TERM GOAL #1   Title Pt will initiate HEP for RLE strengthening and balance to indicate decreased fall risk and improved functional mobility.  (Target Date: 03/09/15)   Baseline Met 12/2.   Status Achieved   PT SHORT TERM GOAL #2   Title Pt will perform 6MWT and increase score by 150' in order to indicate functional improvement in endurance. (Target Date: 03/09/15)   Baseline 03/08/15: 1186 feet in 6 minutes with RW with supervision   Status Achieved   PT SHORT TERM GOAL #3   Title Pt will verbalize compliance with walking program to indicate improvement in functional endurance.  (Target Date: 03/09/15)   Baseline 03/08/15: walking at home with walker daily, up to 6 minutes on occasion, otherwise around 4 mintues. walking several times a day.    Status Achieved   PT SHORT TERM GOAL #4   Title Pt will increase gait speed to 2.31 ft/sec in order to indicate decreased fall risk and more efficient gait.  (Target Date: 03/09/15)   PT SHORT TERM GOAL #5   Title Pt will increase DGI to 13/24 in order to indicate decreased fall risk and functional improvemnet in balance. (Target Date: 03/09/15)   Baseline 03/08/15: scored 15/24 today with use of cane   Status Achieved   PT SHORT TERM GOAL #6   Title Pt will ambulate 300' on indoor surfaces with SPC at mod I level in order to indicate safe home negotiation.  (Target Date: 03/09/15)   Baseline met on 03/08/15   Status Achieved           PT Long Term Goals - 03/10/15 1053    PT LONG TERM GOAL #1   Title Pt will be independent with HEP in order to indicate improved RLE strength, balance and decreased fall risk. (Target Date: 04/06/15)   Status On-going   PT LONG  TERM GOAL #2   Title Pt will increase 6MWT distance by 150' in order to indicate improvement in functional endurance.  (Target Date: 04/06/15)   Status On-going   PT LONG TERM GOAL #3   Title Pt will increase DGI to 20/24 in order to indicate decreased fall risk and functional improvement in balance. (Target Date: 04/06/15)   Status On-going   PT LONG TERM GOAL #4   Title Pt will verbalize return to community fitness/leisure activity to indicate return to prior level of function. (Target Date: 04/06/15)   Status On-going  PT LONG TERM GOAL #5   Title Pt will ambulate >500' on outdoor surfaces (curb and ramp included) without AD at mod I level in order to indicate safe community negotiation. (Target Date: 04/06/15)   Status On-going        03/08/15 1412  Plan  Clinical Impression Statement Pt has met all STG's today and is progressing toward LTG's. Pt with improved gait distance, gait velocity and breath control with activity today. Continues to get short of breath with increased distances and activities.   Pt will benefit from skilled therapeutic intervention in order to improve on the following deficits Abnormal gait;Decreased activity tolerance;Decreased balance;Decreased coordination;Decreased endurance;Decreased mobility;Decreased strength;Impaired perceived functional ability;Impaired sensation;Impaired UE functional use;Postural dysfunction  Rehab Potential Excellent  PT Frequency 2x / week  PT Duration 8 weeks  PT Treatment/Interventions ADLs/Self Care Home Management;Electrical Stimulation;DME Instruction;Gait training;Stair training;Functional mobility training;Therapeutic activities;Therapeutic exercise;Balance training;Neuromuscular re-education;Patient/family education;Orthotic Fit/Training;Visual/perceptual remediation/compensation  PT Next Visit Plan . Continue RLE strengthening, balance (SLS/lateral wt. shifting), gait with SPC on outdoor/compliant surfaces. Cue pt to breathe  consistently during gait and exercise.  PT Home Exercise Plan See 12/16 Pt Instructions  Consulted and Agree with Plan of Care Patient;Family member/caregiver  Family Member Consulted wife-[name]     Problem List Patient Active Problem List   Diagnosis Date Noted  . Thalamic hemorrhage ([place])   . ICH (intracerebral hemorrhage) ([place]) 02/01/2015  . Abdominal pain 01/25/2015  . Acute hepatitis   . Enteritis   . Ileus ([place])   . Uncontrollable vomiting     Willow Ora 03/10/2015, 10:54 AM  Willow Ora, PTA, [facility] [address] [place], [place] [address] [phone] 03/10/2015, 10:54 AM   Name: Todd [name] MRN: [id number] Date of Birth: [birth year]

## 2015-03-11 ENCOUNTER — Ambulatory Visit: Payer: Medicare Other | Admitting: Physical Therapy

## 2015-03-11 ENCOUNTER — Ambulatory Visit: Payer: Medicare Other | Admitting: Occupational Therapy

## 2015-03-11 DIAGNOSIS — G8193 Hemiplegia, unspecified affecting right nondominant side: Secondary | ICD-10-CM | POA: Diagnosis not present

## 2015-03-11 DIAGNOSIS — R269 Unspecified abnormalities of gait and mobility: Secondary | ICD-10-CM

## 2015-03-11 DIAGNOSIS — R2681 Unsteadiness on feet: Secondary | ICD-10-CM

## 2015-03-11 DIAGNOSIS — IMO0002 Reserved for concepts with insufficient information to code with codable children: Secondary | ICD-10-CM

## 2015-03-11 DIAGNOSIS — Z7409 Other reduced mobility: Secondary | ICD-10-CM

## 2015-03-11 NOTE — Therapy (Signed)
[facility] [address] [place], [place], [address] Phone: [phone]   Fax:  [phone]  Occupational Therapy Treatment  Patient Details  Name: Todd [name] MRN: [id number] Date of Birth: 1940/10/26 Referring Provider: Dr. Antony Contras  Encounter Date: 03/11/2015      OT End of Session - 03/11/15 1537    Visit Number 8   Number of Visits 16   Date for OT Re-Evaluation 04/12/15   Authorization Type UHC medicare   Authorization Time Period 60 days   Authorization - Visit Number 8   Authorization - Number of Visits 10   OT Start Time [id number]   OT Stop Time 1615   OT Time Calculation (min) 41 min   Activity Tolerance Patient tolerated treatment well   Behavior During Therapy [facility] for tasks assessed/performed      Past Medical History  Diagnosis Date  . PONV (postoperative nausea and vomiting)   . Hyperlipidemia     takes Crestor daily  . Hypertension     takes Amlodipine and Metoprolol  . Coronary artery disease   . 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 Joint pain     knees,elbows,back  . Diabetes mellitus     lantus and novolog;fasting sugars run 70-120  . CKD (chronic kidney disease)     Past Surgical History  Procedure Laterality Date  . Knee arthroscopy  80's    right knee  . Cholecystectomy  20+yrs ago  . Cardiac catheterization  01/2006    2 stents placed  . Back surgery  09/15/10  . Cataract extraction  12/21/10--02/01/11    right and left  . Elbow surgery  2012    right elbow  . Colonoscopy    . Ulnar nerve transposition  03/02/2011    Procedure: ULNAR NERVE DECOMPRESSION/TRANSPOSITION;  Surgeon: Peggyann Shoals, MD;  Location: [facility];  Service: Neurosurgery;  Laterality: Left;  LEFT ulnar nerve decompression  . Total hip arthroplasty      There were no vitals filed for this visit.  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 No/denies                      OT Treatments/Exercises (OP) - 03/11/15 0001    ADLs   Leisure In standing for incr balance and wt. shift, practiced putting with good accuracy.     Hand Exercises   Other Hand Exercises Picking up blocks using 35lbs sustained grip strength for approx 1/2 blocks, then incr to 55lbs sustained grip strength with mod difficulty/drops for approx 1/2 blocks.   Fine Motor Coordination   Fine Motor Coordination Grooved pegs   Grooved pegs placing in pegboard with mod difficulty, mod cues for compensation at shoulder/trunk   Functional Reaching Activities   High Level In standing, functional reaching to place small pegs in vertical pegboard to copy design with min difficulty/drops and 1 rest break.  Pt needed min cueing to wt. shift to R side and avoid compensation.  OT Short Term Goals - 03/08/15 1746    OT SHORT TERM GOAL #1   Title Pt and wife will be mod I with HEP - 03/15/2015   Status Achieved   OT SHORT TERM GOAL #2   Title Pt will demonstrate improved coordination in RUE as evidenced by decreasing time on 9 hole peg by at least 5 seconds (baseline = 48.22)   Status Achieved  38.02   OT SHORT TERM GOAL #3   Title Pt will demonstrate improved grip strength by 5 pounds in LUE to assist in functional tasks (baseline= 55 pounds)   Status Achieved  64 pounds   OT SHORT TERM GOAL #4   Title Pt will be mod I with shower transfers   Status On-going   OT SHORT TERM GOAL #5   Title Pt will be mod I with LB dressing   Status Achieved           OT Long Term Goals - 03/08/15 1747    OT LONG TERM GOAL #1    Title Pt and wife will be mod I with upgraded HEP prn - 04/12/2015   Status On-going   OT LONG TERM GOAL #2   Title Pt will demonstrate improved coordination in RUE as evidenced by decreasing time on 9 hole peg test by 13 seconds (baseline=48.22)   Status Revised   OT LONG TERM GOAL #3   Title Pt will be able to lift 5 pound object off overhead shelf 3x with RUE.   Status On-going   OT LONG TERM GOAL #4   Title Pt will demonstrate improved grip strength by at least 15 pounds to assist in functional activities (baseline=55 pounds)   Status Revised   OT LONG TERM GOAL #5   Title Pt will be able to use RUE as non domnant hand for computer use for work    Status On-going               Plan - 03/11/15 1633    Clinical Impression Statement Pt continues to make good progress towards goals, but needs cues for trunk/shoulder compensation during functional tasks.   Plan functional mobility, functional use of R hand, coordination   Consulted and Agree with Plan of Care Patient;Family member/caregiver   Family Member Consulted wife Beverlee Nims        Problem List Patient Active Problem List   Diagnosis Date Noted  . Thalamic hemorrhage ([place])   . ICH (intracerebral hemorrhage) ([place]) 02/01/2015  . Abdominal pain 01/25/2015  . Acute hepatitis   . Enteritis   . Ileus ([place])   . Uncontrollable vomiting     [facility] 03/11/2015, 4:34 PM  [facility] [address] [place], [place], [address] Phone: [phone]   Fax:  [phone]  Name: Todd [name] MRN: [id number] Date of Birth: December 22, 1940  Vianne Bulls, OTR/L 03/11/2015 4:34 PM

## 2015-03-11 NOTE — Patient Instructions (Signed)
Feet Apart (Compliant Surface) Varied Arm Positions - Eyes Closed - Arms by your side   Stand with your back to a corner with a stable chair in front of you. Stand with feet shoulder width apart and arms by your sidet. Close eyes and visualize upright position. Hold__30__ seconds. Repeat _4__ times per session. Do __2__ sessions per day.

## 2015-03-11 NOTE — Therapy (Signed)
[facility] [address] [place], [place], [address] Phone: [phone]   Fax:  [phone]  Physical Therapy Treatment  Patient Details  Name: Todd [name] MRN: [id number] Date of Birth: 12-10-1940 Referring Provider: Burnetta Sabin, NP  Encounter Date: 03/11/2015      PT End of Session - 03/11/15 1734    Visit Number 9   Number of Visits 17   Date for PT Re-Evaluation 04/10/15   Authorization Type UHC MCR-Gcodes on every 10th visit.    PT Start Time 1449   PT Stop Time 1533   PT Time Calculation (min) 44 min   Equipment Utilized During Treatment Gait belt   Activity Tolerance Patient limited by fatigue;Patient tolerated treatment well  Frequent seated rest breaks required   Behavior During Therapy WFL for tasks assessed/performed      Past Medical History  Diagnosis Date  . PONV (postoperative nausea and vomiting)   . Hyperlipidemia     takes Crestor daily  . Hypertension     takes Amlodipine and Metoprolol  . Coronary artery disease   . 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 Joint pain     knees,elbows,back  . Diabetes mellitus     lantus and novolog;fasting sugars run 70-120  . CKD (chronic kidney disease)     Past Surgical History  Procedure Laterality Date  . Knee arthroscopy  80's    right knee  . Cholecystectomy  20+yrs ago  . Cardiac catheterization  01/2006    2 stents placed  . Back surgery  09/15/10  . Cataract extraction  12/21/10--02/01/11    right and left  . Elbow surgery  2012    right elbow  . Colonoscopy    . Ulnar nerve transposition  03/02/2011    Procedure: ULNAR NERVE DECOMPRESSION/TRANSPOSITION;  Surgeon: Peggyann Shoals, MD;  Location: [facility];  Service: Neurosurgery;  Laterality: Left;  LEFT ulnar nerve decompression  . Total hip arthroplasty      There were no vitals filed for this visit.  Visit Diagnosis:  Abnormality of gait  Unsteadiness  Hemiplegia affecting  right nondominant side (HCC)      Subjective Assessment - 03/11/15 1454    Subjective Pt reports no falls and no significant changes. Pt has been using SPC in home. Wife states, "He hasn't done anything grossly unsafe yet."   Patient is accompained by: Family member  wife, Todd [name]   Limitations Standing;Walking;House hold activities   Patient Stated Goals to get back to being independent   Currently in Pain? No/denies                         [facility] Adult PT Treatment/Exercise - 03/11/15 0001    Ambulation/Gait   Ambulation/Gait Yes   Ambulation/Gait Assistance 5: Supervision;6: Modified independent (Device/Increase time);4: Min guard   Ambulation/Gait Assistance Details Mod I over level, indoor surfaces x400'; supervision to min guard over unlevel, paved surfaces x550' due to single LOB (with effective self-recovery) due to R toe catch when ambulating over surface change.   Ambulation Distance (Feet) 950 Feet  total   Assistive device Straight cane;None   Gait Pattern Step-through pattern;Trunk flexed   Ambulation Surface Level;Unlevel;Indoor;Outdoor;Paved   Gait velocity 3.14 ft/sec   Posture/Postural Control   Posture/Postural Control Postural limitations   Postural Limitations Rounded Shoulders;Forward head   Posture Comments lateral trunk shift to L side  Balance Exercises - 03/11/15 1524    Balance Exercises: Standing   Standing Eyes Closed Wide (BOA);Foam/compliant surface;2 reps;30 secs   Rockerboard Lateral;EO;30 seconds;5 reps;Intermittent UE support;Other (comment)  large board; mirror for visual feedback           PT Education - 03/11/15 1726    Education provided Yes   Education Details HEP: corner balance exercise added.   Person(s) Educated Patient   Methods Explanation;Demonstration;Handout   Comprehension Verbalized understanding;Returned demonstration          PT Short Term Goals - 03/11/15 1458    PT SHORT TERM GOAL  #1   Title Pt will initiate HEP for RLE strengthening and balance to indicate decreased fall risk and improved functional mobility.  (Target Date: 03/09/15)   Baseline Met 12/2.   Status Achieved   PT SHORT TERM GOAL #2   Title Pt will perform 6MWT and increase score by 150' in order to indicate functional improvement in endurance. (Target Date: 03/09/15)   Baseline 03/08/15: 1186 feet in 6 minutes with RW with supervision   Status Achieved   PT SHORT TERM GOAL #3   Title Pt will verbalize compliance with walking program to indicate improvement in functional endurance.  (Target Date: 03/09/15)   Baseline 03/08/15: walking at home with walker daily, up to 6 minutes on occasion, otherwise around 4 mintues. walking several times a day.    Status Achieved   PT SHORT TERM GOAL #4   Title Pt will increase gait speed to 2.31 ft/sec in order to indicate decreased fall risk and more efficient gait.  (Target Date: 03/09/15)   Baseline 12/22: gait velocity = 3.14 ft/sec with RW   Status Achieved   PT SHORT TERM GOAL #5   Title Pt will increase DGI to 13/24 in order to indicate decreased fall risk and functional improvemnet in balance. (Target Date: 03/09/15)   Baseline 03/08/15: scored 15/24 today with use of cane   Status Achieved   PT SHORT TERM GOAL #6   Title Pt will ambulate 300' on indoor surfaces with SPC at mod I level in order to indicate safe home negotiation.  (Target Date: 03/09/15)   Baseline met on 03/08/15   Status Achieved           PT Long Term Goals - 03/10/15 1053    PT LONG TERM GOAL #1   Title Pt will be independent with HEP in order to indicate improved RLE strength, balance and decreased fall risk. (Target Date: 04/06/15)   Status On-going   PT LONG TERM GOAL #2   Title Pt will increase 6MWT distance by 150' in order to indicate improvement in functional endurance.  (Target Date: 04/06/15)   Status On-going   PT LONG TERM GOAL #3   Title Pt will increase DGI to 20/24  in order to indicate decreased fall risk and functional improvement in balance. (Target Date: 04/06/15)   Status On-going   PT LONG TERM GOAL #4   Title Pt will verbalize return to community fitness/leisure activity to indicate return to prior level of function. (Target Date: 04/06/15)   Status On-going   PT LONG TERM GOAL #5   Title Pt will ambulate >500' on outdoor surfaces (curb and ramp included) without AD at mod I level in order to indicate safe community negotiation. (Target Date: 04/06/15)   Status On-going               Plan - 03/11/15 1735  Clinical Impression Statement Session focused on gait training outdoors with [facility] and balance training with emphasis on awareness of posture when on dynamic surfaces. STG for gait speed met during this session, suggesting improved efficiency of ambulation. Pt continues to require frequent rest breaks during session; however, pt did ambulate x550' consecutively outdoors today.    Pt will benefit from skilled therapeutic intervention in order to improve on the following deficits Abnormal gait;Decreased activity tolerance;Decreased balance;Decreased coordination;Decreased endurance;Decreased mobility;Decreased strength;Impaired perceived functional ability;Impaired sensation;Impaired UE functional use;Postural dysfunction   Rehab Potential Excellent   PT Frequency 2x / week   PT Duration 8 weeks   PT Treatment/Interventions ADLs/Self Care Home Management;Electrical Stimulation;DME Instruction;Gait training;Stair training;Functional mobility training;Therapeutic activities;Therapeutic exercise;Balance training;Neuromuscular re-education;Patient/family education;Orthotic Fit/Training;Visual/perceptual remediation/compensation   PT Next Visit Plan GCODES. Midline orientation on compliant surfaces, RLE strengthening, balance (SLS/lateral wt. shifting), gait with SPC on outdoor/compliant surfaces. Cue pt to breathe consistently during gait and exercise.    Consulted and Agree with Plan of Care Patient;Family member/caregiver   Family Member Consulted wife-Todd [name]        Problem List Patient Active Problem List   Diagnosis Date Noted  . Thalamic hemorrhage ([place])   . 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 Abdominal pain 01/25/2015  . Acute hepatitis   . Enteritis   . Ileus ([place])   . Uncontrollable vomiting     Billie Ruddy, PT, DPT [facility] [address] [place], [place], [address] Phone: [phone]   Fax:  [phone] 03/11/2015, 5:45 PM  Name: Todd [name] MRN: [id number] Date of Birth: 15-Jun-1940

## 2015-03-18 ENCOUNTER — Ambulatory Visit: Payer: Medicare Other | Admitting: Occupational Therapy

## 2015-03-18 ENCOUNTER — Encounter: Payer: Self-pay | Admitting: Occupational Therapy

## 2015-03-18 DIAGNOSIS — Z7409 Other reduced mobility: Secondary | ICD-10-CM

## 2015-03-18 DIAGNOSIS — IMO0002 Reserved for concepts with insufficient information to code with codable children: Secondary | ICD-10-CM

## 2015-03-18 DIAGNOSIS — G8193 Hemiplegia, unspecified affecting right nondominant side: Secondary | ICD-10-CM | POA: Diagnosis not present

## 2015-03-18 DIAGNOSIS — R201 Hypoesthesia of skin: Secondary | ICD-10-CM

## 2015-03-18 NOTE — Therapy (Signed)
[facility] [address] [place], [place], [address] Phone: [phone]   Fax:  [phone]  Occupational Therapy Treatment  Patient Details  Name: Todd [name] MRN: [id number] Date of Birth: 04/03/1940 Referring Provider: Dr. Antony Contras  Encounter Date: 03/18/2015      OT End of Session - 03/18/15 1528    Visit Number 9   Number of Visits 16   Date for OT Re-Evaluation 04/12/15   Authorization Type UHC medicare   Authorization Time Period 60 days   Authorization - Visit Number 9   Authorization - Number of Visits 10   OT Start Time [id number]   OT Stop Time 1445   OT Time Calculation (min) 43 min   Activity Tolerance Patient tolerated treatment well      Past Medical History  Diagnosis Date  . PONV (postoperative nausea and vomiting)   . Hyperlipidemia     takes Crestor daily  . Hypertension     takes Amlodipine and Metoprolol  . Coronary artery disease   . 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 Joint pain     knees,elbows,back  . Diabetes mellitus     lantus and novolog;fasting sugars run 70-120  . CKD (chronic kidney disease)     Past Surgical History  Procedure Laterality Date  . Knee arthroscopy  80's    right knee  . Cholecystectomy  20+yrs ago  . Cardiac catheterization  01/2006    2 stents placed  . Back surgery  09/15/10  . Cataract extraction  12/21/10--02/01/11    right and left  . Elbow surgery  2012    right elbow  . Colonoscopy    . Ulnar nerve transposition  03/02/2011    Procedure: ULNAR NERVE DECOMPRESSION/TRANSPOSITION;  Surgeon: Peggyann Shoals, MD;  Location: [facility];  Service: Neurosurgery;  Laterality: Left;  LEFT ulnar nerve decompression  . Total hip arthroplasty      There were no vitals filed for this visit.  Visit Diagnosis:  Lack of coordination due to stroke  Hemiplegia affecting right nondominant side (HCC)  Impaired functional mobility and activity  tolerance  Impaired sensation      Subjective Assessment - 03/18/15 1406    Subjective  The pain in my elbow has been gone for the last few days   Patient is accompained by: Family member  wife   Pertinent History L Thalamic CVA, h/o of TKR   Patient Stated Goals Get back like I was - play golf, work do anything I want to   Currently in Pain? No/denies                      OT Treatments/Exercises (OP) - 03/18/15 0001    ADLs   Functional Mobility Addressed balance and functional mobility through use of functional tasks - pt continues to imrprove and requires less assistance for activities requiring altenating patterns of movement as well as UE out of support with dynamic standing activities.    ADL Comments Updated HEP for theraputty to increase reps to allow pt to continue to progress for hand strength.  Reviewed with pt and wife and stressed importance of following HEP as instructed as pt reported he does theraputty 1 time a day but does not do all reps or exercises as instructed. Pt able to verbalize understanding of program in order to meet goals.    Fine Motor Coordination   In Scientist, clinical (histocompatibility and immunogenetics)  Activities to address in hand manipulation with emphasis on speed, timing and accuracy. Pt is easily frustrated which initially interferes with coordination however as pt slow downs and relaxes his performance improves.                 OT Education - 03/18/15 1526    Education provided Yes   Education Details updated HEP for theraputty   Person(s) Educated Patient;Spouse   Methods Explanation;Demonstration;Verbal cues;Handout   Comprehension Verbalized understanding;Returned demonstration          OT Short Term Goals - 03/18/15 1526    OT SHORT TERM GOAL #1   Title Pt and wife will be mod I with HEP - 03/15/2015   Status Achieved   OT SHORT TERM GOAL #2   Title Pt will demonstrate improved coordination in RUE as evidenced by decreasing time on 9  hole peg by at least 5 seconds (baseline = 48.22)   Status Achieved  38.02   OT SHORT TERM GOAL #3   Title Pt will demonstrate improved grip strength by 5 pounds in LUE to assist in functional tasks (baseline= 55 pounds)   Status Achieved  64 pounds   OT SHORT TERM GOAL #4   Title Pt will be mod I with shower transfers   Status On-going   OT SHORT TERM GOAL #5   Title Pt will be mod I with LB dressing   Status Achieved           OT Long Term Goals - 03/18/15 1526    OT LONG TERM GOAL #1   Title Pt and wife will be mod I with upgraded HEP prn - 04/12/2015   Status On-going   OT LONG TERM GOAL #2   Title Pt will demonstrate improved coordination in RUE as evidenced by decreasing time on 9 hole peg test by 13 seconds (baseline=48.22)   Status On-going   OT LONG TERM GOAL #3   Title Pt will be able to lift 5 pound object off overhead shelf 3x with RUE.   Status Achieved   OT LONG TERM GOAL #4   Title Pt will demonstrate improved grip strength by at least 15 pounds to assist in functional activities (baseline=55 pounds)   Status On-going   OT LONG TERM GOAL #5   Title Pt will be able to use RUE as non domnant hand for computer use for work    Status Achieved               Plan - 03/18/15 1527    Clinical Impression Statement Pt making excellent progress toward goals. Pt with improved functional mobility and use of RUE.    Pt will benefit from skilled therapeutic intervention in order to improve on the following deficits (Retired) Decreased activity tolerance;Decreased balance;Decreased coordination;Decreased mobility;Decreased strength;Impaired UE functional use;Impaired sensation   Rehab Potential Good   OT Frequency 2x / week   OT Duration 8 weeks   OT Treatment/Interventions Self-care/ADL training;Moist Heat;Fluidtherapy;Ultrasound;Therapeutic exercise;Neuromuscular education;DME and/or AE instruction;Manual Therapy;Functional Mobility Training;Splinting;Therapeutic  activities;Patient/family education;Balance training   Plan strength in R hand, funcitonal use of hand, coordination, functional mobility   Consulted and Agree with Plan of Care Patient;Family member/caregiver   Family Member Consulted wife Beverlee Nims        Problem List Patient Active Problem List   Diagnosis Date Noted  . Thalamic hemorrhage ([facility])   . 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 Abdominal pain 01/25/2015  . Acute hepatitis   . Enteritis   .  Ileus ([place])   . Uncontrollable vomiting     Quay Burow, OTR/L 03/18/2015, 3:30 PM  [facility] [address] [place], [place], [address] Phone: [phone]   Fax:  [phone]  Name: KENDRIEL PERES MRN: [id number] Date of Birth: 1941-01-19

## 2015-03-18 NOTE — Patient Instructions (Addendum)
                                                                                                                                                                                                                                                                                                                                                 Theraputty exercises: Using green color putty: Do these 1-2 times per day. STOP if you get pain and let your therapist know.  1. Make a ball  Make a pancake  Make a cone Do this sequence 3-5 times  2. Make a fat hot dog. Squeeze as hard as you can. Do this 10 times

## 2015-03-19 ENCOUNTER — Ambulatory Visit: Payer: Medicare Other | Admitting: Occupational Therapy

## 2015-03-19 ENCOUNTER — Encounter: Payer: Self-pay | Admitting: Rehabilitation

## 2015-03-19 ENCOUNTER — Ambulatory Visit: Payer: Medicare Other | Admitting: Rehabilitation

## 2015-03-19 ENCOUNTER — Encounter: Payer: Self-pay | Admitting: Occupational Therapy

## 2015-03-19 DIAGNOSIS — R269 Unspecified abnormalities of gait and mobility: Secondary | ICD-10-CM

## 2015-03-19 DIAGNOSIS — IMO0002 Reserved for concepts with insufficient information to code with codable children: Secondary | ICD-10-CM

## 2015-03-19 DIAGNOSIS — Z7409 Other reduced mobility: Secondary | ICD-10-CM

## 2015-03-19 DIAGNOSIS — R2681 Unsteadiness on feet: Secondary | ICD-10-CM

## 2015-03-19 DIAGNOSIS — G8193 Hemiplegia, unspecified affecting right nondominant side: Secondary | ICD-10-CM

## 2015-03-19 DIAGNOSIS — R201 Hypoesthesia of skin: Secondary | ICD-10-CM

## 2015-03-19 NOTE — Therapy (Signed)
[facility] [address] [place], [place], [address] Phone: [phone]   Fax:  [phone]  Occupational Therapy Treatment  Patient Details  Name: Todd [name] MRN: [id number] Date of Birth: 1941-01-26 Referring Provider: Dr. Antony Contras  Encounter Date: 03/19/2015      OT End of Session - 03/19/15 1156    Visit Number 10   Number of Visits 16   Date for OT Re-Evaluation 04/12/15   Authorization Type UHC medicare   Authorization Time Period 60 days   Authorization - Visit Number 10   Authorization - Number of Visits 10   OT Start Time 1103   OT Stop Time 1145   OT Time Calculation (min) 42 min      Past Medical History  Diagnosis Date  . PONV (postoperative nausea and vomiting)   . Hyperlipidemia     takes Crestor daily  . Hypertension     takes Amlodipine and Metoprolol  . Coronary artery disease   . 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 Joint pain     knees,elbows,back  . Diabetes mellitus     lantus and novolog;fasting sugars run 70-120  . CKD (chronic kidney disease)     Past Surgical History  Procedure Laterality Date  . Knee arthroscopy  80's    right knee  . Cholecystectomy  20+yrs ago  . Cardiac catheterization  01/2006    2 stents placed  . Back surgery  09/15/10  . Cataract extraction  12/21/10--02/01/11    right and left  . Elbow surgery  2012    right elbow  . Colonoscopy    . Ulnar nerve transposition  03/02/2011    Procedure: ULNAR NERVE DECOMPRESSION/TRANSPOSITION;  Surgeon: Peggyann Shoals, MD;  Location: [facility];  Service: Neurosurgery;  Laterality: Left;  LEFT ulnar nerve decompression  . Total hip arthroplasty      There were no vitals filed for this visit.  Visit Diagnosis:  Hemiplegia affecting right nondominant side (HCC)  Impaired functional mobility and activity tolerance  Impaired sensation  Lack of coordination due to stroke      Subjective Assessment -  03/19/15 1109    Subjective  there isn't much I can't do any more with my hand   Patient is accompained by: Family member  wife   Pertinent History L Thalamic CVA, h/o of TKR   Patient Stated Goals Get back like I was - play golf, work do anything I want to   Currently in Pain? No/denies                      OT Treatments/Exercises (OP) - 03/19/15 0001    Exercises   Exercises Hand   Hand Exercises   Hand Gripper with Small Beads Addressed hand strengtth - pt able to pick up 1 inch blocks with 70# of resistance today with only 1 drop.  Also addressed sustained strength with 55# of resistance and asking pt to created mulitple stacks of 4 blocks each.  Pt only required 2 short rest breaks with these activities.     In Hand Manipulation Training Therapeutic activities to address fine motor coordination with emphasis on in hand manipulation, using vision to compensate for sensory impairment, speed and accuracy. Also reassessed 9 hole peg test - see goals for specific details.                OT Education - 03/18/15 1526  Education provided Yes   Education Details updated HEP for theraputty   Person(s) Educated Patient;Spouse   Methods Explanation;Demonstration;Verbal cues;Handout   Comprehension Verbalized understanding;Returned demonstration          OT Short Term Goals - 2015-04-18 1154    OT SHORT TERM GOAL #1   Title Pt and wife will be mod I with HEP - 03/15/2015   Status Achieved   OT SHORT TERM GOAL #2   Title Pt will demonstrate improved coordination in RUE as evidenced by decreasing time on 9 hole peg by at least 5 seconds (baseline = 48.22)   Status Achieved  38.02   OT SHORT TERM GOAL #3   Title Pt will demonstrate improved grip strength by 5 pounds in LUE to assist in functional tasks (baseline= 55 pounds)   Status Achieved  64 pounds   OT SHORT TERM GOAL #4   Title Pt will be mod I with shower transfers   Status On-going   OT SHORT TERM GOAL  #5   Title Pt will be mod I with LB dressing   Status Achieved           OT Long Term Goals - 04/18/2015 1154    OT LONG TERM GOAL #1   Title Pt and wife will be mod I with upgraded HEP prn - 04/12/2015   Status On-going   OT LONG TERM GOAL #2   Title Pt will demonstrate improved coordination in RUE as evidenced by decreasing time on 9 hole peg test by 13 seconds (baseline=48.22)   Status Achieved  31.17   OT LONG TERM GOAL #3   Title Pt will be able to lift 5 pound object off overhead shelf 3x with RUE.   Status Achieved   OT LONG TERM GOAL #4   Title Pt will demonstrate improved grip strength by at least 15 pounds to assist in functional activities (baseline=55 pounds)   Status On-going   OT LONG TERM GOAL #5   Title Pt will be able to use RUE as non domnant hand for computer use for work    Status Achieved               Plan - 2015-04-18 1154    Clinical Impression Statement Pt continues to make progress toward goals especially in terms of functional mobiity, use of RUE and coordination.  Pt able to verbalize indpendently today that he needs to slow down when doing tasks.    Pt will benefit from skilled therapeutic intervention in order to improve on the following deficits (Retired) Decreased activity tolerance;Decreased balance;Decreased coordination;Decreased mobility;Decreased strength;Impaired UE functional use;Impaired sensation   Rehab Potential Good   OT Frequency 2x / week   OT Duration 8 weeks   OT Treatment/Interventions Self-care/ADL training;Moist Heat;Fluidtherapy;Ultrasound;Therapeutic exercise;Neuromuscular education;DME and/or AE instruction;Manual Therapy;Functional Mobility Training;Splinting;Therapeutic activities;Patient/family education;Balance training   Plan grip strength   Consulted and Agree with Plan of Care Patient;Family member/caregiver   Family Member Consulted wife Lizabeth Leyden - 04-18-15 1200    Functional Limitation  Carrying, moving and handling objects   Carrying, Moving and Handling Objects Current Status [phone]) At least 40 percent but less than 60 percent impaired, limited or restricted   Carrying, Moving and Handling Objects Goal Status ([id number]) At least 20 percent but less than 40 percent impaired, limited or restricted      Problem List Patient Active Problem List   Diagnosis Date Noted  . Thalamic  hemorrhage ([place])   . 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 Abdominal pain 01/25/2015  . Acute hepatitis   . Enteritis   . Ileus ([place])   . Uncontrollable vomiting    Occupational Therapy Progress Note  Dates of Reporting Period: 01/26/2015 to 03/19/2015  Objective Reports of Subjective Statement: Pt has made excellent progress toward all goals - pt has met all STG's and 3/5 LTG's  Pt is very motivated to improve use of his R hand as well as his functional mobility.  Objective Measurements: Pt has improved grip strength by 9 pounds and decreased time on 9 hole peg test by more than 16 seconds.   Goal Update: See above for goal status  Plan: Focus on hand strength, functional use of R hand (especially carrying and completing tasks in standing), and finalizing HEP.  Reason Skilled Services are Required: pt requires continued services due to decreased hand strength, decreased functional use of R hand and deceased functional mobility.  Quay Burow, OTR/L 03/19/2015, 12:02 PM  [facility] [address] [place], [place], [address] Phone: [phone]   Fax:  [phone]  Name: Todd [name] MRN: [id number] Date of Birth: 1940-05-31

## 2015-03-19 NOTE — Therapy (Signed)
[facility] [address] [place], [place], [address] Phone: [phone]   Fax:  [phone]  Physical Therapy Treatment  Patient Details  Name: Todd [name] MRN: [id number] Date of Birth: May 06, 1940 Referring Provider: Burnetta Sabin, NP  Encounter Date: 03/19/2015      PT End of Session - 03/19/15 1028    Visit Number 10   Number of Visits 17   Date for PT Re-Evaluation 04/10/15   Authorization Type UHC MCR-Gcodes on every 10th visit.    PT Start Time 1018   PT Stop Time 1104   PT Time Calculation (min) 46 min   Equipment Utilized During Treatment Gait belt   Activity Tolerance Patient limited by fatigue;Patient tolerated treatment well  Frequent seated rest breaks required   Behavior During Therapy WFL for tasks assessed/performed      Past Medical History  Diagnosis Date  . PONV (postoperative nausea and vomiting)   . Hyperlipidemia     takes Crestor daily  . Hypertension     takes Amlodipine and Metoprolol  . Coronary artery disease   . 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 Joint pain     knees,elbows,back  . Diabetes mellitus     lantus and novolog;fasting sugars run 70-120  . CKD (chronic kidney disease)     Past Surgical History  Procedure Laterality Date  . Knee arthroscopy  80's    right knee  . Cholecystectomy  20+yrs ago  . Cardiac catheterization  01/2006    2 stents placed  . Back surgery  09/15/10  . Cataract extraction  12/21/10--02/01/11    right and left  . Elbow surgery  2012    right elbow  . Colonoscopy    . Ulnar nerve transposition  03/02/2011    Procedure: ULNAR NERVE DECOMPRESSION/TRANSPOSITION;  Surgeon: Peggyann Shoals, MD;  Location: [facility];  Service: Neurosurgery;  Laterality: Left;  LEFT ulnar nerve decompression  . Total hip arthroplasty      There were no vitals filed for this visit.  Visit Diagnosis:  Abnormality of gait  Unsteadiness  Weakness due to  cerebrovascular accident  Impaired functional mobility and activity tolerance      Subjective Assessment - 03/19/15 1027    Subjective Pt reports no significant changes, wife reports that he doesn't seem like he feels as well today.    Limitations Standing;Walking;House hold activities   Patient Stated Goals to get back to being independent   Currently in Pain? No/denies           NMR:  Assessed DGI for progress note, see full details below.  Educated pt and wife that pt has made functional improvement in balance and that he is working towards [place].  Pt and wife verbalized understanding.  Remainder of session focused on high level balance and weight shifting tasks for improved midline orientation and increased weight shift to the L.  Performed gait outdoors with SPC on paved and grassy surface at min/guard level with min cues for stepping sequence.  While standing in grass pt able to putt golf ball x 3 sets of 3 reps to address balance on compliant surface and weight shift.  Pt able to perform at S level.  Progressed to standing in // bars on small rocker board grasping for object to the R and tossing to the L in order to increase lateral weight shifting as he continues to demonstrate increased R lateral weight shift.  Min facilitation for weight shift during task.  Performed x 2 sets of 15 reps at min A level.  Utilized [employer]ologist, engineering for [employer] task to improve pt carryover and increase proprioceptive feedback on balance.                [facility] Adult PT Treatment/Exercise - 03/19/15 1036    Standardized Balance Assessment   Standardized Balance Assessment Dynamic Gait Index   Dynamic Gait Index   Level Surface Mild Impairment   Change in Gait Speed Mild Impairment   Gait with Horizontal Head Turns Moderate Impairment   Gait with Vertical Head Turns Moderate Impairment   Gait and Pivot Turn Mild Impairment   Step Over Obstacle Mild Impairment   Step Around Obstacles  Moderate Impairment   Steps Moderate Impairment   Total Score 12                PT Education - 03/19/15 1028    Education provided Yes   Education Details education on results of DGI   Person(s) Educated Patient   Methods Explanation   Comprehension Verbalized understanding          PT Short Term Goals - 03/11/15 1458    PT SHORT TERM GOAL #1   Title Pt will initiate HEP for RLE strengthening and balance to indicate decreased fall risk and improved functional mobility.  (Target Date: 03/09/15)   Baseline Met 12/2.   Status Achieved   PT SHORT TERM GOAL #2   Title Pt will perform 6MWT and increase score by 150' in order to indicate functional improvement in endurance. (Target Date: 03/09/15)   Baseline 03/08/15: 1186 feet in 6 minutes with RW with supervision   Status Achieved   PT SHORT TERM GOAL #3   Title Pt will verbalize compliance with walking program to indicate improvement in functional endurance.  (Target Date: 03/09/15)   Baseline 03/08/15: walking at home with walker daily, up to 6 minutes on occasion, otherwise around 4 mintues. walking several times a day.    Status Achieved   PT SHORT TERM GOAL #4   Title Pt will increase gait speed to 2.31 ft/sec in order to indicate decreased fall risk and more efficient gait.  (Target Date: 03/09/15)   Baseline 12/22: gait velocity = 3.14 ft/sec with RW   Status Achieved   PT SHORT TERM GOAL #5   Title Pt will increase DGI to 13/24 in order to indicate decreased fall risk and functional improvemnet in balance. (Target Date: 03/09/15)   Baseline 03/08/15: scored 15/24 today with use of cane   Status Achieved   PT SHORT TERM GOAL #6   Title Pt will ambulate 300' on indoor surfaces with SPC at mod I level in order to indicate safe home negotiation.  (Target Date: 03/09/15)   Baseline met on 03/08/15   Status Achieved           PT Long Term Goals - 03/10/15 1053    PT LONG TERM GOAL #1   Title Pt will be  independent with HEP in order to indicate improved RLE strength, balance and decreased fall risk. (Target Date: 04/06/15)   Status On-going   PT LONG TERM GOAL #2   Title Pt will increase 6MWT distance by 150' in order to indicate improvement in functional endurance.  (Target Date: 04/06/15)   Status On-going   PT LONG TERM GOAL #3   Title Pt will increase DGI to 20/24 in order to indicate decreased fall  risk and functional improvement in balance. (Target Date: 04/06/15)   Status On-going   PT LONG TERM GOAL #4   Title Pt will verbalize return to community fitness/leisure activity to indicate return to prior level of function. (Target Date: 04/06/15)   Status On-going   PT LONG TERM GOAL #5   Title Pt will ambulate >500' on outdoor surfaces (curb and ramp included) without AD at mod I level in order to indicate safe community negotiation. (Target Date: 04/06/15)   Status On-going               Plan - 2015-04-08 1119    Clinical Impression Statement Skilled session focused on assessment of balance for progress note to MD.  Note improvement from 9/24 on DGI to 12/24 today.  Pt demonstrating functional gain/improvement in balance.  Educated pt and wife.  Remainder of session focused on high level balance with focus on increasing weight shift to the L as he continues to have increased WB on RLE.  Tolerated well.    Pt will benefit from skilled therapeutic intervention in order to improve on the following deficits Abnormal gait;Decreased activity tolerance;Decreased balance;Decreased coordination;Decreased endurance;Decreased mobility;Decreased strength;Impaired perceived functional ability;Impaired sensation;Impaired UE functional use;Postural dysfunction   Rehab Potential Excellent   PT Frequency 2x / week   PT Duration 8 weeks   PT Treatment/Interventions ADLs/Self Care Home Management;Electrical Stimulation;DME Instruction;Gait training;Stair training;Functional mobility training;Therapeutic  activities;Therapeutic exercise;Balance training;Neuromuscular re-education;Patient/family education;Orthotic Fit/Training;Visual/perceptual remediation/compensation   PT Next Visit Plan Midline orientation on compliant surfaces, RLE strengthening, balance (SLS/lateral wt. shifting), gait with SPC on outdoor/compliant surfaces. Cue pt to breathe consistently during gait and exercise.   Consulted and Agree with Plan of Care Patient;Family member/caregiver   Family Member Consulted wife-Todd [name]          G-Codes - April 08, 2015 1121    Functional Assessment Tool Used DGI: 12/24   Functional Limitation Mobility: Walking and moving around   Mobility: Walking and Moving Around Current Status [phone]) At least 40 percent but less than 60 percent impaired, limited or restricted   Mobility: Walking and Moving Around Goal Status [phone]) At least 1 percent but less than 20 percent impaired, limited or restricted      Physical Therapy Progress Note  Dates of Reporting Period: 02/09/15 to 04-08-2015  Objective Reports of Subjective Statement: See subjective reports above.    Objective Measurements: DGI 12/24  Goal Update: See goals above  Plan: Continue current POC.  Pt making slow but steady progress towards all LTGs.   Reason Skilled Services are Required: Pt continues to have deficits impacting balance, midline orientation, proprioception and sensory deficits in which therapy is still addressing.     Problem List Patient Active Problem List   Diagnosis Date Noted  . 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 ICH (intracerebral hemorrhage) ([facility]) 02/01/2015  . Abdominal pain 01/25/2015  . Acute hepatitis   . Enteritis   . Ileus ([facility])   . Uncontrollable vomiting     Cameron Sprang, PT, MPT [facility] [address] [place], [place], [address] Phone: [phone]   Fax:  [phone] April 08, 2015, 11:24 AM  Name: Todd [name] MRN: [id number] Date of Birth:  [birth year]

## 2015-03-23 ENCOUNTER — Ambulatory Visit: Payer: Medicare Other | Attending: Nurse Practitioner | Admitting: Occupational Therapy

## 2015-03-23 ENCOUNTER — Encounter: Payer: Self-pay | Admitting: Occupational Therapy

## 2015-03-23 DIAGNOSIS — IMO0002 Reserved for concepts with insufficient information to code with codable children: Secondary | ICD-10-CM

## 2015-03-23 DIAGNOSIS — G8193 Hemiplegia, unspecified affecting right nondominant side: Secondary | ICD-10-CM

## 2015-03-23 DIAGNOSIS — R279 Unspecified lack of coordination: Secondary | ICD-10-CM | POA: Insufficient documentation

## 2015-03-23 DIAGNOSIS — R201 Hypoesthesia of skin: Secondary | ICD-10-CM | POA: Insufficient documentation

## 2015-03-23 DIAGNOSIS — R269 Unspecified abnormalities of gait and mobility: Secondary | ICD-10-CM | POA: Diagnosis present

## 2015-03-23 DIAGNOSIS — R2681 Unsteadiness on feet: Secondary | ICD-10-CM | POA: Insufficient documentation

## 2015-03-23 DIAGNOSIS — I6980 Unspecified sequelae of other cerebrovascular disease: Secondary | ICD-10-CM | POA: Insufficient documentation

## 2015-03-23 DIAGNOSIS — R449 Unspecified symptoms and signs involving general sensations and perceptions: Secondary | ICD-10-CM

## 2015-03-23 DIAGNOSIS — R4189 Other symptoms and signs involving cognitive functions and awareness: Secondary | ICD-10-CM | POA: Diagnosis present

## 2015-03-23 DIAGNOSIS — Z7409 Other reduced mobility: Secondary | ICD-10-CM | POA: Insufficient documentation

## 2015-03-23 NOTE — Therapy (Signed)
[facility] [address] [place], [place], [address] Phone: [phone]   Fax:  [phone]  Occupational Therapy Treatment  Patient Details  Name: Todd [name] MRN: [id number] Date of Birth: October 09, 1940 Referring Provider: Dr. Antony Contras  Encounter Date: 03/23/2015      OT End of Session - 03/23/15 1137    Visit Number 11   Number of Visits 16   Date for OT Re-Evaluation 04/12/15   Authorization Type UHC medicare   Authorization Time Period 60 days   Authorization - Visit Number 11   Authorization - Number of Visits 20   OT Start Time 1016   OT Stop Time 1103   OT Time Calculation (min) 47 min      Past Medical History  Diagnosis Date  . PONV (postoperative nausea and vomiting)   . Hyperlipidemia     takes Crestor daily  . Hypertension     takes Amlodipine and Metoprolol  . Coronary artery disease   . 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 Joint pain     knees,elbows,back  . Diabetes mellitus     lantus and novolog;fasting sugars run 70-120  . CKD (chronic kidney disease)     Past Surgical History  Procedure Laterality Date  . Knee arthroscopy  80's    right knee  . Cholecystectomy  20+yrs ago  . Cardiac catheterization  01/2006    2 stents placed  . Back surgery  09/15/10  . Cataract extraction  12/21/10--02/01/11    right and left  . Elbow surgery  2012    right elbow  . Colonoscopy    . Ulnar nerve transposition  03/02/2011    Procedure: ULNAR NERVE DECOMPRESSION/TRANSPOSITION;  Surgeon: Peggyann Shoals, MD;  Location: [facility];  Service: Neurosurgery;  Laterality: Left;  LEFT ulnar nerve decompression  . Total hip arthroplasty      There were no vitals filed for this visit.  Visit Diagnosis:  Impaired functional mobility and activity tolerance  Hemiplegia affecting right nondominant side (HCC)  Impaired sensation  Lack of coordination due to stroke  Sensory deficit, right       Subjective Assessment - 03/23/15 1020    Patient is accompained by: Family member  wife   Pertinent History L Thalamic CVA, h/o of TKR   Patient Stated Goals Get back like I was - play golf, work do anything I want to   Currently in Pain? No/denies                      OT Treatments/Exercises (OP) - 03/23/15 0001    Exercises   Exercises Hand   Hand Exercises   Other Hand Exercises Therapeutic activities to address sustained strength, coordination, grading and control using R hand in functional tasks. Pt requires  min vc's to use vision to compensate for decreased sensation.  Pt reports he has been more compliant with theraputty HEP over the past few days.  Pt able to use R hand in resistive activity for greater than 40 minutes with only 4 short rest breaks.                  OT Short Term Goals - 03/23/15 1136    OT SHORT TERM GOAL #1   Title Pt and wife will be mod I with HEP - 03/15/2015   Status Achieved   OT SHORT TERM GOAL #2   Title Pt will demonstrate  improved coordination in RUE as evidenced by decreasing time on 9 hole peg by at least 5 seconds (baseline = 48.22)   Status Achieved  38.02   OT SHORT TERM GOAL #3   Title Pt will demonstrate improved grip strength by 5 pounds in LUE to assist in functional tasks (baseline= 55 pounds)   Status Achieved  64 pounds   OT SHORT TERM GOAL #4   Title Pt will be mod I with shower transfers   Status Achieved   OT SHORT TERM GOAL #5   Title Pt will be mod I with LB dressing   Status Achieved           OT Long Term Goals - 03/23/15 1136    OT LONG TERM GOAL #1   Title Pt and wife will be mod I with upgraded HEP prn - 04/12/2015   Status On-going   OT LONG TERM GOAL #2   Title Pt will demonstrate improved coordination in RUE as evidenced by decreasing time on 9 hole peg test by 13 seconds (baseline=48.22)   Status Achieved  31.17   OT LONG TERM GOAL #3   Title Pt will be able to lift 5 pound object  off overhead shelf 3x with RUE.   Status Achieved   OT LONG TERM GOAL #4   Title Pt will demonstrate improved grip strength by at least 15 pounds to assist in functional activities (baseline=55 pounds)   Status On-going   OT LONG TERM GOAL #5   Title Pt will be able to use RUE as non domnant hand for computer use for work    Status Achieved               Plan - 03/23/15 1136    Clinical Impression Statement Pt has made excellent progress and is currently working on [place] last two LTG's.  Pt with improved compliance with HEP to address grip strength.    Pt will benefit from skilled therapeutic intervention in order to improve on the following deficits (Retired) Decreased activity tolerance;Decreased balance;Decreased coordination;Decreased mobility;Decreased strength;Impaired UE functional use;Impaired sensation   Rehab Potential Good   OT Frequency 2x / week   OT Duration 8 weeks   OT Treatment/Interventions Self-care/ADL training;Moist Heat;Fluidtherapy;Ultrasound;Therapeutic exercise;Neuromuscular education;DME and/or AE instruction;Manual Therapy;Functional Mobility Training;Splinting;Therapeutic activities;Patient/family education;Balance training   Plan grip strength, update HEP prn   Consulted and Agree with Plan of Care Patient;Family member/caregiver   Family Member Consulted wife Beverlee Nims        Problem List Patient Active Problem List   Diagnosis Date Noted  . Thalamic hemorrhage ([place])   . 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 Abdominal pain 01/25/2015  . Acute hepatitis   . Enteritis   . Ileus ([facility])   . Uncontrollable vomiting     Quay Burow, OTR/L 03/23/2015, 11:39 AM  [facility] [address] [place], [place], [address] Phone: [phone]   Fax:  [phone]  Name: Todd [name] MRN: [id number] Date of Birth: April 12, 1940

## 2015-03-25 ENCOUNTER — Ambulatory Visit: Payer: Medicare Other | Admitting: Rehabilitation

## 2015-03-25 ENCOUNTER — Encounter: Payer: Self-pay | Admitting: Rehabilitation

## 2015-03-25 ENCOUNTER — Ambulatory Visit: Payer: Medicare Other | Admitting: Occupational Therapy

## 2015-03-25 ENCOUNTER — Encounter: Payer: Self-pay | Admitting: Occupational Therapy

## 2015-03-25 DIAGNOSIS — Z7409 Other reduced mobility: Secondary | ICD-10-CM

## 2015-03-25 DIAGNOSIS — IMO0002 Reserved for concepts with insufficient information to code with codable children: Secondary | ICD-10-CM

## 2015-03-25 DIAGNOSIS — R269 Unspecified abnormalities of gait and mobility: Secondary | ICD-10-CM

## 2015-03-25 DIAGNOSIS — R201 Hypoesthesia of skin: Secondary | ICD-10-CM

## 2015-03-25 DIAGNOSIS — R449 Unspecified symptoms and signs involving general sensations and perceptions: Secondary | ICD-10-CM

## 2015-03-25 DIAGNOSIS — G8193 Hemiplegia, unspecified affecting right nondominant side: Secondary | ICD-10-CM

## 2015-03-25 DIAGNOSIS — R4189 Other symptoms and signs involving cognitive functions and awareness: Secondary | ICD-10-CM

## 2015-03-25 DIAGNOSIS — R2681 Unsteadiness on feet: Secondary | ICD-10-CM

## 2015-03-25 NOTE — Therapy (Signed)
[facility] [address] [place], [place], [address] Phone: [phone]   Fax:  [phone]  Physical Therapy Treatment  Patient Details  Name: Todd [name] MRN: [id number] Date of Birth: 1940-10-24 Referring Provider: Burnetta Sabin, NP  Encounter Date: 03/25/2015      PT End of Session - 03/25/15 1020    Visit Number 11   Number of Visits 17   Date for PT Re-Evaluation 04/10/15   Authorization Type UHC MCR-Gcodes on every 10th visit.    Equipment Utilized During Treatment Gait belt   Activity Tolerance Patient limited by fatigue;Patient tolerated treatment well  Frequent seated rest breaks required   Behavior During Therapy WFL for tasks assessed/performed      Past Medical History  Diagnosis Date  . PONV (postoperative nausea and vomiting)   . Hyperlipidemia     takes Crestor daily  . Hypertension     takes Amlodipine and Metoprolol  . Coronary artery disease   . 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 Joint pain     knees,elbows,back  . Diabetes mellitus     lantus and novolog;fasting sugars run 70-120  . CKD (chronic kidney disease)     Past Surgical History  Procedure Laterality Date  . Knee arthroscopy  80's    right knee  . Cholecystectomy  20+yrs ago  . Cardiac catheterization  01/2006    2 stents placed  . Back surgery  09/15/10  . Cataract extraction  12/21/10--02/01/11    right and left  . Elbow surgery  2012    right elbow  . Colonoscopy    . Ulnar nerve transposition  03/02/2011    Procedure: ULNAR NERVE DECOMPRESSION/TRANSPOSITION;  Surgeon: Peggyann Shoals, MD;  Location: [facility];  Service: Neurosurgery;  Laterality: Left;  LEFT ulnar nerve decompression  . Total hip arthroplasty      There were no vitals filed for this visit.  Visit Diagnosis:  No diagnosis found.      Subjective Assessment - 03/25/15 1020    Subjective No complaints, no changes since last visit, no falls.,    Patient is accompained by: Family member   Limitations Standing;Walking;House hold activities   Patient Stated Goals to get back to being independent   Currently in Pain? No/denies           Gait:  Assessed gait on unlevel paved surfaces outdoors to address LTG with SPC.  Performed 1000' at S level (min/guard on small area of grass for safety) with cues for slower gait speed for improved safety, esp when going up/down inclines and declines.  Also note some mild balance difficulties with turns.  Discussed this with pt and wife and that PT feels he is able to ambulate from car into/out of clinic with [facility] and in community on paved surfaces only at S level from wife.  Both verbalized understanding.  Progressed to performing gait indoors with [facility] while negotiating around and over obstacles to increase challenges pt may face in community.  Pt able to negotiate around obstacles very well at S level, however when stepping over obstacles, note that pt with increased RLE circumduction to clear over obstacle, therefore needed cues for placement of foot, increased hip/knee flexion as well as placement of cane to traverse obstacles safely.  Transitioned to stairs and curb/ramp negotiation.  Performed in blocked practice (curb/ramp) x 4 reps with SPC with cues for sequencing and technique.  Feel that he still needs  min cues for ascending with LLE for increased safety.  Did very well on ramp at S level.  Ended session with stair negotiation with L rail only to simulate home entry.  Had pt place cane in R hand and ascend/descend normally.  He was able to ascend/descend with either LE leading, however did more safely with step to pattern vs alternating.  Will progress towards this in therapy sessions.                       PT Education - 03/25/15 1020    Education provided Yes   Person(s) Educated Patient   Methods Explanation   Comprehension Verbalized understanding          PT Short Term  Goals - 03/11/15 1458    PT SHORT TERM GOAL #1   Title Pt will initiate HEP for RLE strengthening and balance to indicate decreased fall risk and improved functional mobility.  (Target Date: 03/09/15)   Baseline Met 12/2.   Status Achieved   PT SHORT TERM GOAL #2   Title Pt will perform 6MWT and increase score by 150' in order to indicate functional improvement in endurance. (Target Date: 03/09/15)   Baseline 03/08/15: 1186 feet in 6 minutes with RW with supervision   Status Achieved   PT SHORT TERM GOAL #3   Title Pt will verbalize compliance with walking program to indicate improvement in functional endurance.  (Target Date: 03/09/15)   Baseline 03/08/15: walking at home with walker daily, up to 6 minutes on occasion, otherwise around 4 mintues. walking several times a day.    Status Achieved   PT SHORT TERM GOAL #4   Title Pt will increase gait speed to 2.31 ft/sec in order to indicate decreased fall risk and more efficient gait.  (Target Date: 03/09/15)   Baseline 12/22: gait velocity = 3.14 ft/sec with RW   Status Achieved   PT SHORT TERM GOAL #5   Title Pt will increase DGI to 13/24 in order to indicate decreased fall risk and functional improvemnet in balance. (Target Date: 03/09/15)   Baseline 03/08/15: scored 15/24 today with use of cane   Status Achieved   PT SHORT TERM GOAL #6   Title Pt will ambulate 300' on indoor surfaces with SPC at mod I level in order to indicate safe home negotiation.  (Target Date: 03/09/15)   Baseline met on 03/08/15   Status Achieved           PT Long Term Goals - 03/10/15 1053    PT LONG TERM GOAL #1   Title Pt will be independent with HEP in order to indicate improved RLE strength, balance and decreased fall risk. (Target Date: 04/06/15)   Status On-going   PT LONG TERM GOAL #2   Title Pt will increase 6MWT distance by 150' in order to indicate improvement in functional endurance.  (Target Date: 04/06/15)   Status On-going   PT LONG TERM  GOAL #3   Title Pt will increase DGI to 20/24 in order to indicate decreased fall risk and functional improvement in balance. (Target Date: 04/06/15)   Status On-going   PT LONG TERM GOAL #4   Title Pt will verbalize return to community fitness/leisure activity to indicate return to prior level of function. (Target Date: 04/06/15)   Status On-going   PT LONG TERM GOAL #5   Title Pt will ambulate >500' on outdoor surfaces (curb and ramp included) without AD at mod I  level in order to indicate safe community negotiation. (Target Date: 04/06/15)   Status On-going               Plan - 03/25/15 1021    Pt will benefit from skilled therapeutic intervention in order to improve on the following deficits Abnormal gait;Decreased activity tolerance;Decreased balance;Decreased coordination;Decreased endurance;Decreased mobility;Decreased strength;Impaired perceived functional ability;Impaired sensation;Impaired UE functional use;Postural dysfunction   Rehab Potential Excellent   PT Frequency 2x / week   PT Duration 8 weeks   PT Treatment/Interventions ADLs/Self Care Home Management;Electrical Stimulation;DME Instruction;Gait training;Stair training;Functional mobility training;Therapeutic activities;Therapeutic exercise;Balance training;Neuromuscular re-education;Patient/family education;Orthotic Fit/Training;Visual/perceptual remediation/compensation   PT Next Visit Plan Midline orientation on compliant surfaces, RLE strengthening, balance (SLS/lateral wt. shifting), gait with SPC on outdoor/compliant surfaces. Cue pt to breathe consistently during gait and exercise.   Consulted and Agree with Plan of Care Patient;Family member/caregiver   Family Member Consulted wife-[name]        Problem List Patient Active Problem List   Diagnosis Date Noted  . Thalamic hemorrhage ([place])   . ICH (intracerebral hemorrhage) ([place]) 02/01/2015  . Abdominal pain 01/25/2015  . Acute hepatitis   . Enteritis   .  Ileus ([place])   . Uncontrollable vomiting     Cameron Sprang, PT, MPT [facility] [address] [place], [place], [address] Phone: [phone]   Fax:  [phone] 03/25/2015, 12:21 PM  Name: GARRY NICOLINI MRN: [id number] Date of Birth: December 29, 1940

## 2015-03-25 NOTE — Therapy (Signed)
[facility] [address] [place], [place], [address] Phone: [phone]   Fax:  [phone]  Occupational Therapy Treatment  Patient Details  Name: Todd [name] MRN: [id number] Date of Birth: 16-Sep-1940 Referring Provider: Dr. Antony Contras  Encounter Date: 03/25/2015      OT End of Session - 03/25/15 1220    Visit Number 12   Number of Visits 16   Date for OT Re-Evaluation 04/12/15   Authorization Type [employer] medicare   Authorization Time Period 60 days   Authorization - Visit Number 12   Authorization - Number of Visits 20   OT Start Time 1101   OT Stop Time 1145   OT Time Calculation (min) 44 min   Activity Tolerance Patient tolerated treatment well      Past Medical History  Diagnosis Date  . PONV (postoperative nausea and vomiting)   . Hyperlipidemia     takes Crestor daily  . Hypertension     takes Amlodipine and Metoprolol  . Coronary artery disease   . 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 Joint pain     knees,elbows,back  . Diabetes mellitus     lantus and novolog;fasting sugars run 70-120  . CKD (chronic kidney disease)     Past Surgical History  Procedure Laterality Date  . Knee arthroscopy  80's    right knee  . Cholecystectomy  20+yrs ago  . Cardiac catheterization  01/2006    2 stents placed  . Back surgery  09/15/10  . Cataract extraction  12/21/10--02/01/11    right and left  . Elbow surgery  2012    right elbow  . Colonoscopy    . Ulnar nerve transposition  03/02/2011    Procedure: ULNAR NERVE DECOMPRESSION/TRANSPOSITION;  Surgeon: Peggyann Shoals, MD;  Location: [facility];  Service: Neurosurgery;  Laterality: Left;  LEFT ulnar nerve decompression  . Total hip arthroplasty      There were no vitals filed for this visit.  Visit Diagnosis:  Impaired functional mobility and activity tolerance  Hemiplegia affecting right nondominant side (HCC)  Impaired sensation  Lack of  coordination due to stroke  Sensory deficit, right      Subjective Assessment - 03/25/15 1106    Subjective  I guess I am really doing better.   Patient is accompained by: Family member  wife   Pertinent History L Thalamic CVA, h/o of TKR   Patient Stated Goals Get back like I was - play golf, work do anything I want to   Currently in Pain? No/denies                      OT Treatments/Exercises (OP) - 03/25/15 0001    Exercises   Exercises Hand   Hand Exercises   Theraputty - Locate Pegs 20 pegs in BLUE putty today.  Pt also issued blue putty for use with part of HEP at home - pt to use both green and blue putty for HEP - see pt instuctions for details. Pt able to return demonstrate and verbalize understanding.     Hand Gripper with Small Beads Focused on using gripper with 50# of resistance to UNSTACK stacks of 5 blocks with minimal dropping.  Pt requires increased time as well as 4 rest breaks.                OT Education - 03/25/15 1219    Education provided Yes  Education Details upgraded theraputty program and issued blue putty   Person(s) Educated Patient;Spouse   Methods Explanation;Demonstration;Verbal cues;Handout   Comprehension Verbalized understanding;Returned demonstration          OT Short Term Goals - 03/25/15 1219    OT SHORT TERM GOAL #1   Title Pt and wife will be mod I with HEP - 03/15/2015   Status Achieved   OT SHORT TERM GOAL #2   Title Pt will demonstrate improved coordination in RUE as evidenced by decreasing time on 9 hole peg by at least 5 seconds (baseline = 48.22)   Status Achieved  38.02   OT SHORT TERM GOAL #3   Title Pt will demonstrate improved grip strength by 5 pounds in LUE to assist in functional tasks (baseline= 55 pounds)   Status Achieved  64 pounds   OT SHORT TERM GOAL #4   Title Pt will be mod I with shower transfers   Status Achieved   OT SHORT TERM GOAL #5   Title Pt will be mod I with LB dressing    Status Achieved           OT Long Term Goals - 03/25/15 1219    OT LONG TERM GOAL #1   Title Pt and wife will be mod I with upgraded HEP prn - 04/12/2015   Status On-going   OT LONG TERM GOAL #2   Title Pt will demonstrate improved coordination in RUE as evidenced by decreasing time on 9 hole peg test by 13 seconds (baseline=48.22)   Status Achieved  31.17   OT LONG TERM GOAL #3   Title Pt will be able to lift 5 pound object off overhead shelf 3x with RUE.   Status Achieved   OT LONG TERM GOAL #4   Title Pt will demonstrate improved grip strength by at least 15 pounds to assist in functional activities (baseline=55 pounds)   Status On-going   OT LONG TERM GOAL #5   Title Pt will be able to use RUE as non domnant hand for computer use for work    Status Achieved               Plan - 03/25/15 1219    Clinical Impression Statement Pt making good progress and able to tolerate more strenuous HEP for R hand.   Pt will benefit from skilled therapeutic intervention in order to improve on the following deficits (Retired) Decreased activity tolerance;Decreased balance;Decreased coordination;Decreased mobility;Decreased strength;Impaired UE functional use;Impaired sensation   Rehab Potential Good   OT Frequency 2x / week   OT Duration 8 weeks   OT Treatment/Interventions Self-care/ADL training;Moist Heat;Fluidtherapy;Ultrasound;Therapeutic exercise;Neuromuscular education;DME and/or AE instruction;Manual Therapy;Functional Mobility Training;Splinting;Therapeutic activities;Patient/family education;Balance training   Plan grip strength, continue to upgrade HEP as possible   Consulted and Agree with Plan of Care Patient;Family member/caregiver   Family Member Consulted wife Beverlee Nims        Problem List Patient Active Problem List   Diagnosis Date Noted  . Thalamic hemorrhage ([place])   . ICH (intracerebral hemorrhage) ([place]) 02/01/2015  . Abdominal pain 01/25/2015  . Acute hepatitis    . Enteritis   . Ileus ([place])   . Uncontrollable vomiting     Quay Burow, OTR/L 03/25/2015, 12:22 PM  [facility] [address] [place], [place], [address] Phone: [phone]   Fax:  [phone]  Name: RYUU LASPISA MRN: [id number] Date of Birth: 03/07/1941

## 2015-03-25 NOTE — Patient Instructions (Signed)
Theraputty exercises: Do these 1-2 times per day. STOP if you get pain and let your therapist know.  GREEN PUTTYy: 1. Make a ball  Make a pancake  Make a cone Do this sequence 3-5 times  BLUE PUTTY: 2. Make a fat hot dog. Squeeze as hard as you can. Do this 20 times

## 2015-03-30 ENCOUNTER — Ambulatory Visit: Payer: Medicare Other | Admitting: Rehabilitation

## 2015-03-30 ENCOUNTER — Encounter: Payer: Medicare Other | Admitting: Occupational Therapy

## 2015-03-30 ENCOUNTER — Encounter: Payer: Self-pay | Admitting: Rehabilitation

## 2015-03-30 DIAGNOSIS — IMO0002 Reserved for concepts with insufficient information to code with codable children: Secondary | ICD-10-CM

## 2015-03-30 DIAGNOSIS — G8193 Hemiplegia, unspecified affecting right nondominant side: Secondary | ICD-10-CM

## 2015-03-30 DIAGNOSIS — Z7409 Other reduced mobility: Secondary | ICD-10-CM | POA: Diagnosis not present

## 2015-03-30 DIAGNOSIS — R269 Unspecified abnormalities of gait and mobility: Secondary | ICD-10-CM

## 2015-03-30 DIAGNOSIS — R2681 Unsteadiness on feet: Secondary | ICD-10-CM

## 2015-03-30 NOTE — Therapy (Signed)
[facility] [address] [place], [place], [address] Phone: [phone]   Fax:  [phone]  Physical Therapy Treatment  Patient Details  Name: Todd [name] MRN: [id number] Date of Birth: 1940-08-19 Referring Provider: Burnetta Sabin, NP  Encounter Date: 03/30/2015      PT End of Session - 03/30/15 1458    Visit Number 12   Number of Visits 17   Date for PT Re-Evaluation 04/10/15   Authorization Type [employer] MCR-Gcodes on every 10th visit.    PT Start Time 1448   PT Stop Time 1532   PT Time Calculation (min) 44 min   Equipment Utilized During Treatment Gait belt   Activity Tolerance Patient limited by fatigue;Patient tolerated treatment well   Behavior During Therapy [facility] for tasks assessed/performed      Past Medical History  Diagnosis Date  . PONV (postoperative nausea and vomiting)   . Hyperlipidemia     takes Crestor daily  . Hypertension     takes Amlodipine and Metoprolol  . Coronary artery disease   . 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 Joint pain     knees,elbows,back  . Diabetes mellitus     lantus and novolog;fasting sugars run 70-120  . CKD (chronic kidney disease)     Past Surgical History  Procedure Laterality Date  . Knee arthroscopy  80's    right knee  . Cholecystectomy  20+yrs ago  . Cardiac catheterization  01/2006    2 stents placed  . Back surgery  09/15/10  . Cataract extraction  12/21/10--02/01/11    right and left  . Elbow surgery  2012    right elbow  . Colonoscopy    . Ulnar nerve transposition  03/02/2011    Procedure: ULNAR NERVE DECOMPRESSION/TRANSPOSITION;  Surgeon: Peggyann Shoals, MD;  Location: [facility];  Service: Neurosurgery;  Laterality: Left;  LEFT ulnar nerve decompression  . Total hip arthroplasty      There were no vitals filed for this visit.  Visit Diagnosis:  Abnormality of gait  Unsteadiness  Impaired functional mobility and activity tolerance  Lack  of coordination due to stroke  Hemiplegia affecting right nondominant side (HCC)      Subjective Assessment - 03/30/15 1456    Subjective Pt reports walking from car into clinic with SPC, tolerated well, no issues.     Limitations Standing;Walking;House hold activities   Patient Stated Goals to get back to being independent   Currently in Pain? No/denies             Gait:  Continue to assess safety with use of cane in community setting with stair negotiation and curb/ramp.  Performed stairs at mod I level in step to fashion.  When asked to ascend/descend in alternating fashion, note that he can ascend at mod I level, however when descending due to premorbid R hip issues, needs to descend in step to fashion in order to maintain safety.  Pt verbalized understanding.  Transitioned to gait with cane around track performing varying tasks such as increased gait speed, performing turns, sudden stops.  Pt able to ambulate at mod I level x 230'.  Then progressed to ambulation with no device to further assess and challenge pt.  Pt able to ambulate x 230' without device at S level again at varying speeds, making turns and ambulating over home like surfaces.  Provided pt and wife okay for pt to ambulate at home without cane, but  still needs cane for community ambulation.  Both verbalized understanding.  Assessed gait quality and endurance by having pt ambulate on treadmill and used gait trainer for visual input for step length.  Note that he tends to take shorter step length on LLE and spends less time on RLE.  Discussed this with pt but difficult to discern whether stroke related or hip related.  Ended session with negotiation in "figure 8" fashion around hoola hoops to address safety with turns.  Pt able to perform at S level with min cues for inside step length.                      PT Education - 03/30/15 1457    Education provided Yes   Education Details ambulating at home without  cane    Person(s) Educated Patient   Methods Explanation   Comprehension Verbalized understanding          PT Short Term Goals - 03/11/15 1458    PT SHORT TERM GOAL #1   Title Pt will initiate HEP for RLE strengthening and balance to indicate decreased fall risk and improved functional mobility.  (Target Date: 03/09/15)   Baseline Met 12/2.   Status Achieved   PT SHORT TERM GOAL #2   Title Pt will perform 6MWT and increase score by 150' in order to indicate functional improvement in endurance. (Target Date: 03/09/15)   Baseline 03/08/15: 1186 feet in 6 minutes with RW with supervision   Status Achieved   PT SHORT TERM GOAL #3   Title Pt will verbalize compliance with walking program to indicate improvement in functional endurance.  (Target Date: 03/09/15)   Baseline 03/08/15: walking at home with walker daily, up to 6 minutes on occasion, otherwise around 4 mintues. walking several times a day.    Status Achieved   PT SHORT TERM GOAL #4   Title Pt will increase gait speed to 2.31 ft/sec in order to indicate decreased fall risk and more efficient gait.  (Target Date: 03/09/15)   Baseline 12/22: gait velocity = 3.14 ft/sec with RW   Status Achieved   PT SHORT TERM GOAL #5   Title Pt will increase DGI to 13/24 in order to indicate decreased fall risk and functional improvemnet in balance. (Target Date: 03/09/15)   Baseline 03/08/15: scored 15/24 today with use of cane   Status Achieved   PT SHORT TERM GOAL #6   Title Pt will ambulate 300' on indoor surfaces with SPC at mod I level in order to indicate safe home negotiation.  (Target Date: 03/09/15)   Baseline met on 03/08/15   Status Achieved           PT Long Term Goals - 03/10/15 1053    PT LONG TERM GOAL #1   Title Pt will be independent with HEP in order to indicate improved RLE strength, balance and decreased fall risk. (Target Date: 04/06/15)   Status On-going   PT LONG TERM GOAL #2   Title Pt will increase 6MWT  distance by 150' in order to indicate improvement in functional endurance.  (Target Date: 04/06/15)   Status On-going   PT LONG TERM GOAL #3   Title Pt will increase DGI to 20/24 in order to indicate decreased fall risk and functional improvement in balance. (Target Date: 04/06/15)   Status On-going   PT LONG TERM GOAL #4   Title Pt will verbalize return to community fitness/leisure activity to indicate return to prior level  of function. (Target Date: 04/06/15)   Status On-going   PT LONG TERM GOAL #5   Title Pt will ambulate >500' on outdoor surfaces (curb and ramp included) without AD at mod I level in order to indicate safe community negotiation. (Target Date: 04/06/15)   Status On-going               Plan - 03/30/15 1458    Clinical Impression Statement Skilled session focused on high level gait with and without SPC.  Feel that pt is now safe to ambulate at home without [facility], therefore cleared him to do so.  Pt and wife verbalized understanding.  Also addressed negotiation of curb/ramp and stairs with SPC to assess carryover from last session.  Good recal and performed at S to mod I level.      Pt will benefit from skilled therapeutic intervention in order to improve on the following deficits Abnormal gait;Decreased activity tolerance;Decreased balance;Decreased coordination;Decreased endurance;Decreased mobility;Decreased strength;Impaired perceived functional ability;Impaired sensation;Impaired UE functional use;Postural dysfunction   Rehab Potential Excellent   PT Frequency 2x / week   PT Duration 8 weeks   PT Treatment/Interventions ADLs/Self Care Home Management;Electrical Stimulation;DME Instruction;Gait training;Stair training;Functional mobility training;Therapeutic activities;Therapeutic exercise;Balance training;Neuromuscular re-education;Patient/family education;Orthotic Fit/Training;Visual/perceptual remediation/compensation   PT Next Visit Plan Midline orientation on compliant  surfaces, RLE strengthening, balance (SLS/lateral wt. shifting), gait without SPC on outdoor/compliant surfaces. Cue pt to breathe consistently during gait and exercise.   Consulted and Agree with Plan of Care Patient;Family member/caregiver   Family Member Consulted wife-[name]        Problem List Patient Active Problem List   Diagnosis Date Noted  . Thalamic hemorrhage ([place])   . ICH (intracerebral hemorrhage) ([place]) 02/01/2015  . Abdominal pain 01/25/2015  . Acute hepatitis   . Enteritis   . Ileus ([place])   . Uncontrollable vomiting    Cameron Sprang, PT, MPT [facility] [address] [place], [place], [address] Phone: [phone]   Fax:  [phone] 03/30/2015, 5:15 PM  Name: HERSHY FLENNER MRN: [id number] Date of Birth: [birth year]

## 2015-04-01 ENCOUNTER — Ambulatory Visit: Payer: Medicare Other | Admitting: Occupational Therapy

## 2015-04-01 ENCOUNTER — Ambulatory Visit: Payer: Medicare Other | Admitting: Physical Therapy

## 2015-04-01 DIAGNOSIS — Z7409 Other reduced mobility: Secondary | ICD-10-CM | POA: Diagnosis not present

## 2015-04-01 DIAGNOSIS — R4189 Other symptoms and signs involving cognitive functions and awareness: Secondary | ICD-10-CM

## 2015-04-01 DIAGNOSIS — IMO0002 Reserved for concepts with insufficient information to code with codable children: Secondary | ICD-10-CM

## 2015-04-01 DIAGNOSIS — R449 Unspecified symptoms and signs involving general sensations and perceptions: Secondary | ICD-10-CM

## 2015-04-01 DIAGNOSIS — G8193 Hemiplegia, unspecified affecting right nondominant side: Secondary | ICD-10-CM

## 2015-04-01 DIAGNOSIS — R2681 Unsteadiness on feet: Secondary | ICD-10-CM

## 2015-04-01 DIAGNOSIS — R269 Unspecified abnormalities of gait and mobility: Secondary | ICD-10-CM

## 2015-04-01 DIAGNOSIS — R201 Hypoesthesia of skin: Secondary | ICD-10-CM

## 2015-04-01 NOTE — Therapy (Signed)
[facility] [address] [place], [place], [address] Phone: [phone]   Fax:  [phone]  Physical Therapy Treatment  Patient Details  Name: Todd [name] MRN: [id number] Date of Birth: 1940/09/26 Referring Provider: Burnetta Sabin, NP  Encounter Date: 04/01/2015      PT End of Session - 04/01/15 1255    Visit Number 13   Number of Visits 17   Date for PT Re-Evaluation 04/10/15   Authorization Type UHC MCR-Gcodes on every 10th visit.    PT Start Time 1017   PT Stop Time 1100   PT Time Calculation (min) 43 min   Equipment Utilized During Treatment Gait belt   Activity Tolerance Patient limited by fatigue;Patient tolerated treatment well   Behavior During Therapy WFL for tasks assessed/performed      Past Medical History  Diagnosis Date  . PONV (postoperative nausea and vomiting)   . Hyperlipidemia     takes Crestor daily  . Hypertension     takes Amlodipine and Metoprolol  . Coronary artery disease   . 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 Joint pain     knees,elbows,back  . Diabetes mellitus     lantus and novolog;fasting sugars run 70-120  . CKD (chronic kidney disease)     Past Surgical History  Procedure Laterality Date  . Knee arthroscopy  80's    right knee  . Cholecystectomy  20+yrs ago  . Cardiac catheterization  01/2006    2 stents placed  . Back surgery  09/15/10  . Cataract extraction  12/21/10--02/01/11    right and left  . Elbow surgery  2012    right elbow  . Colonoscopy    . Ulnar nerve transposition  03/02/2011    Procedure: ULNAR NERVE DECOMPRESSION/TRANSPOSITION;  Surgeon: Peggyann Shoals, MD;  Location: [facility];  Service: Neurosurgery;  Laterality: Left;  LEFT ulnar nerve decompression  . Total hip arthroplasty      There were no vitals filed for this visit.  Visit Diagnosis:  Hemiplegia affecting right nondominant side (HCC)  Abnormality of gait  Unsteadiness       Subjective Assessment - 04/01/15 1025    Subjective Pt perceives sensation is improving in RLE. Pt/wife report no falls, no significant issues with pt ambulating in home without SPC. Wife reports pt has difficulty carrying things (files) when walking without cane.   Patient is accompained by: Family member   Limitations Standing;Walking;House hold activities   Patient Stated Goals to get back to being independent   Currently in Pain? No/denies            [facility] PT Assessment - 04/01/15 1248    Strength   Overall Strength Deficits   Overall Strength Comments Hip ABD 4/5 on R, 4-/5 on L.                     [facility] Adult PT Treatment/Exercise - 04/01/15 0001    Ambulation/Gait   Ambulation/Gait Yes   Ambulation/Gait Assistance 5: Supervision   Ambulation/Gait Assistance Details x650' over unlevel, paved surfaces without AD with no overt LOB. Gait over unlevel, paved surfaces with horizontal head turns x20' with noted veering to R side. Linear, gait without obstacle negotiation or dual-tasking x230' over level, indoor surfaces without AD with mod I. Pt required superviison for gait when pt dual tasking, negotiating obstacles, or performing function head turns (see DGI).    Ambulation Distance (Feet) 880 Feet  longest distance = 650' (outdoors) due to pt fatigue   Assistive device None   Gait Pattern Step-through pattern;Trunk flexed  increased L trunk rotation to initiate RLE advancement   Ambulation Surface Level;Unlevel;Indoor;Outdoor;Paved   Ramp 5: Supervision   Ramp Details (indicate cue type and reason) without AD   Curb 5: Supervision   Curb Details (indicate cue type and reason) without AD   Dynamic Gait Index   Level Surface Mild Impairment   Change in Gait Speed Normal   Gait with Horizontal Head Turns Moderate Impairment   Gait with Vertical Head Turns Mild Impairment   Gait and Pivot Turn Normal   Step Over Obstacle Mild Impairment   Step Around Obstacles  Normal   Steps Mild Impairment   Total Score 18   Exercises   Exercises Other Exercises   Other Exercises  clamshells (to pt fatigue) x12 reps on L, x15 reps on R. Mod cueing required for proper technique.         Vestibular Treatment/Exercise - 04/01/15 0001    Vestibular Treatment/Exercise   Vestibular Treatment Provided Gaze   Gaze Exercises X1 Viewing Horizontal;X1 Viewing Vertical   X1 Viewing Horizontal   Foot Position standing, feet shoulder width apart with single UE support   Comments x30 seconds with cueing to maintain gaze on target   X1 Viewing Vertical   Foot Position standing, feet shoulder width apart   Comments x30 seconds; no significant difficulty maintianing gaze on target               PT Education - 04/01/15 1242    Education provided Yes   Education Details Reiterated importance of performing hup strengthening (focus on abductors) at home. Added gaze stabilization to HEP.   Person(s) Educated Patient   Methods Explanation;Demonstration;Tactile cues;Verbal cues;Handout   Comprehension Verbalized understanding;Returned demonstration          PT Short Term Goals - 04/01/15 1251    PT SHORT TERM GOAL #1   Title Pt will initiate HEP for RLE strengthening and balance to indicate decreased fall risk and improved functional mobility.  (Target Date: 03/09/15)   Baseline Met 12/2.   Status Achieved   PT SHORT TERM GOAL #2   Title Pt will perform 6MWT and increase score by 150' in order to indicate functional improvement in endurance. (Target Date: 03/09/15)   Baseline 03/08/15: 1186 feet in 6 minutes with RW with supervision   Status Achieved   PT SHORT TERM GOAL #3   Title Pt will verbalize compliance with walking program to indicate improvement in functional endurance.  (Target Date: 03/09/15)   Baseline 03/08/15: walking at home with walker daily, up to 6 minutes on occasion, otherwise around 4 mintues. walking several times a day.    Status  Achieved   PT SHORT TERM GOAL #4   Title Pt will increase gait speed to 2.31 ft/sec in order to indicate decreased fall risk and more efficient gait.  (Target Date: 03/09/15)   Baseline 12/22: gait velocity = 3.14 ft/sec with RW   Status Achieved   PT SHORT TERM GOAL #5   Title Pt will increase DGI to 13/24 in order to indicate decreased fall risk and functional improvemnet in balance. (Target Date: 03/09/15)   Baseline 03/08/15: scored 15/24 today with use of cane   Status Achieved   PT SHORT TERM GOAL #6   Title Pt will ambulate 300' on indoor surfaces with SPC at mod I level in order to indicate  safe home negotiation.  (Target Date: 03/09/15)   Baseline met on 03/08/15   Status Achieved           PT Long Term Goals - 04/01/15 1251    PT LONG TERM GOAL #1   Title Pt will be independent with HEP in order to indicate improved RLE strength, balance and decreased fall risk. (Target Date: 04/06/15)   Status On-going   PT LONG TERM GOAL #2   Title Pt will increase 6MWT distance by 150' in order to indicate improvement in functional endurance.  (Target Date: 04/06/15)   Status On-going   PT LONG TERM GOAL #3   Title Pt will increase DGI to 20/24 in order to indicate decreased fall risk and functional improvement in balance. (Target Date: 04/06/15)   Baseline 1/12: DGI score = 18/24   Status Partially Met   PT LONG TERM GOAL #4   Title Pt will verbalize return to community fitness/leisure activity to indicate return to prior level of function. (Target Date: 04/06/15)   Status On-going   PT LONG TERM GOAL #5   Title Pt will ambulate >500' on outdoor surfaces (curb and ramp included) without AD at mod I level in order to indicate safe community negotiation. (Target Date: 04/06/15)   Status On-going               Plan - 04/01/15 1256    Clinical Impression Statement DGI score 18/24 during this session, which is improved but not to goal-level of 20/24. Noted significant veering to R  side during gait with head turn to R side. Upon further assessment, noted pt with marked difficulty performing X1 viewing with horizontal head turns. Added gaze stabilization to HEP.   Pt will benefit from skilled therapeutic intervention in order to improve on the following deficits Abnormal gait;Decreased activity tolerance;Decreased balance;Decreased coordination;Decreased endurance;Decreased mobility;Decreased strength;Impaired perceived functional ability;Impaired sensation;Impaired UE functional use;Postural dysfunction   Rehab Potential Excellent   PT Frequency 2x / week   PT Duration 8 weeks   PT Treatment/Interventions ADLs/Self Care Home Management;Electrical Stimulation;DME Instruction;Gait training;Stair training;Functional mobility training;Therapeutic activities;Therapeutic exercise;Balance training;Neuromuscular re-education;Patient/family education;Orthotic Fit/Training;Visual/perceptual remediation/compensation   PT Next Visit Plan Midline orientation on compliant surfaces, RLE strengthening, balance (SLS/lateral wt. shifting), gait without SPC on outdoor/compliant surfaces. May need to reassess DGI closer to end of POC (1/21).    Consulted and Agree with Plan of Care Patient;Family member/caregiver   Family Member Consulted wife-[name]        Problem List Patient Active Problem List   Diagnosis Date Noted  . Thalamic hemorrhage ([place])   . ICH (intracerebral hemorrhage) ([place]) 02/01/2015  . Abdominal pain 01/25/2015  . Acute hepatitis   . Enteritis   . Ileus ([place])   . Uncontrollable vomiting     Billie Ruddy, PT, DPT [facility] [address] [place], [place], [address] Phone: [phone]   Fax:  [phone] 04/01/2015, 1:01 PM   Name: Todd [name] MRN: [id number] Date of Birth: [birth year]

## 2015-04-01 NOTE — Therapy (Signed)
[facility] [address] [place], [place], [address] Phone: [phone]   Fax:  [phone]  Occupational Therapy Treatment  Patient Details  Name: Todd [name] MRN: [id number] Date of Birth: 11-19-1940 Referring Provider: Dr. Antony Contras  Encounter Date: 04/01/2015      OT End of Session - 04/01/15 1217    Visit Number 13   Number of Visits 16   Date for OT Re-Evaluation 04/12/15   Authorization Type UHC medicare   Authorization Time Period 60 days   Authorization - Visit Number 13   Authorization - Number of Visits 20   OT Start Time 1101   OT Stop Time 1143   OT Time Calculation (min) 42 min   Activity Tolerance Patient tolerated treatment well      Past Medical History  Diagnosis Date  . PONV (postoperative nausea and vomiting)   . Hyperlipidemia     takes Crestor daily  . Hypertension     takes Amlodipine and Metoprolol  . Coronary artery disease   . 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 Joint pain     knees,elbows,back  . Diabetes mellitus     lantus and novolog;fasting sugars run 70-120  . CKD (chronic kidney disease)     Past Surgical History  Procedure Laterality Date  . Knee arthroscopy  80's    right knee  . Cholecystectomy  20+yrs ago  . Cardiac catheterization  01/2006    2 stents placed  . Back surgery  09/15/10  . Cataract extraction  12/21/10--02/01/11    right and left  . Elbow surgery  2012    right elbow  . Colonoscopy    . Ulnar nerve transposition  03/02/2011    Procedure: ULNAR NERVE DECOMPRESSION/TRANSPOSITION;  Surgeon: Peggyann Shoals, MD;  Location: [facility];  Service: Neurosurgery;  Laterality: Left;  LEFT ulnar nerve decompression  . Total hip arthroplasty      There were no vitals filed for this visit.  Visit Diagnosis:  Lack of coordination due to stroke  Hemiplegia affecting right nondominant side (HCC)  Impaired sensation  Sensory deficit, right       Subjective Assessment - 04/01/15 1109    Subjective  My blood sugar is dropping (pt provided with crackers and drink)   Pertinent History L Thalamic CVA, h/o of TKR   Patient Stated Goals Get back like I was - play golf, work do anything I want to   Currently in Pain? No/denies                      OT Treatments/Exercises (OP) - 04/01/15 0001    Exercises   Exercises Hand   Hand Exercises   Theraputty - Locate Pegs 25 pegs in blue putty   Hand Gripper with Small Beads Gripper with 55# of resistance with only 3 drops picking up 1 inch blocks and only 2 rest breaks. Also checked remaining goal and pt has now met all LTG's                  OT Short Term Goals - 04/01/15 1216    OT SHORT TERM GOAL #1   Title Pt and wife will be mod I with HEP - 03/15/2015   Status Achieved   OT SHORT TERM GOAL #2   Title Pt will demonstrate improved coordination in RUE as evidenced by decreasing time on 9 hole peg by at least 5  seconds (baseline = 48.22)   Status Achieved  38.02   OT SHORT TERM GOAL #3   Title Pt will demonstrate improved grip strength by 5 pounds in LUE to assist in functional tasks (baseline= 55 pounds)   Status Achieved  64 pounds   OT SHORT TERM GOAL #4   Title Pt will be mod I with shower transfers   Status Achieved   OT SHORT TERM GOAL #5   Title Pt will be mod I with LB dressing   Status Achieved           OT Long Term Goals - 04-11-2015 1119    OT LONG TERM GOAL #1   Title Pt and wife will be mod I with upgraded HEP prn - 04/12/2015   Status Achieved   OT LONG TERM GOAL #2   Title Pt will demonstrate improved coordination in RUE as evidenced by decreasing time on 9 hole peg test by 13 seconds (baseline=48.22)   Status Achieved  31.17   OT LONG TERM GOAL #3   Title Pt will be able to lift 5 pound object off overhead shelf 3x with RUE.   Status Achieved   OT LONG TERM GOAL #4   Title Pt will demonstrate improved grip strength by at least  15 pounds to assist in functional activities (baseline=55 pounds)   Status Achieved  Apr 11, 2015  70 pounds   OT LONG TERM GOAL #5   Title Pt will be able to use RUE as non domnant hand for computer use for work    Status Achieved               Plan - 04-11-2015 May 25, 1214    Clinical Impression Statement Pt has made excellent progress and has met all STG and LTG's. Pt ready for d/c from OT. Pt and wife in agreement with plan.   Pt will benefit from skilled therapeutic intervention in order to improve on the following deficits (Retired) Decreased activity tolerance;Decreased balance;Decreased coordination;Decreased mobility;Decreased strength;Impaired UE functional use;Impaired sensation   Rehab Potential Good   OT Frequency 2x / week   OT Duration 8 weeks   OT Treatment/Interventions Self-care/ADL training;Moist Heat;Fluidtherapy;Ultrasound;Therapeutic exercise;Neuromuscular education;DME and/or AE instruction;Manual Therapy;Functional Mobility Training;Splinting;Therapeutic activities;Patient/family education;Balance training   Plan d/c from [facility] and Agree with Plan of Care Patient;Family member/caregiver   Family Member Consulted wife Lizabeth Leyden - Apr 11, 2015 May 25, 1216    Functional Assessment Tool Used 9 hole peg, dynamometer   Functional Limitation Carrying, moving and handling objects   Carrying, Moving and Handling Objects Current Status [phone]) At least 1 percent but less than 20 percent impaired, limited or restricted   Carrying, Moving and Handling Objects Goal Status ([id number]) At least 20 percent but less than 40 percent impaired, limited or restricted   Carrying, Moving and Handling Objects Discharge Status [phone]) At least 1 percent but less than 20 percent impaired, limited or restricted      Problem List Patient Active Problem List   Diagnosis Date Noted  . Thalamic hemorrhage ([place])   . ICH (intracerebral hemorrhage) ([place]) 02/01/2015  . Abdominal pain  01/25/2015  . Acute hepatitis   . Enteritis   . Ileus ([place])   . Uncontrollable vomiting    OCCUPATIONAL THERAPY DISCHARGE SUMMARY  Visits from Start of Care: 13  Current functional level related to goals / functional outcomes: See above for goal status   Remaining deficits: Decreased balance, mild decreased  coordination in R hand due to sensory impairment   Education / Equipment: HEP Plan: Patient agrees to discharge.  Patient goals were met. Patient is being discharged due to meeting the stated rehab goals.  ?????     Quay Burow, OTR/L 04/01/2015, 12:19 PM  [facility] [address] [place], [place], [address] Phone: [phone]   Fax:  [phone]  Name: Todd [name] MRN: [id number] Date of Birth: [birth year]

## 2015-04-01 NOTE — Patient Instructions (Signed)
Gaze Stabilization: Tip Card  1.Target must remain in focus, not blurry, and appear stationary while head is in motion. 2.Perform exercises with small head movements (45 to either side of midline). 3.Increase speed of head motion so long as target is in focus. 4.If you wear eyeglasses, be sure you can see target through lens (therapist will give specific instructions for bifocal / progressive lenses). 5.These exercises may provoke dizziness or nausea. Work through these symptoms. If too dizzy, slow head movement slightly. Rest between each exercise. 6.Exercises demand concentration; avoid distractions. 7.For safety, perform standing exercises close to a counter, wall, corner, or next to someone.  Copyright  VHI. All rights reserved.   Gaze Stabilization: Standing Feet Apart    Feet shoulder width apart, keeping eyes on target on wall _6__ feet away, tilt head down 15-30 and move head side to side for __30__ seconds. Repeat while moving head up and down for __30__ seconds. Do __2__ sessions per day. Repeat using target on pattern background.  Copyright  VHI. All rights reserved.   Abduction: Clam (Eccentric) - Side-Lying    Lie on side with knees bent. Lift top knee, keeping feet together. Keep trunk steady. Slowly lower for 3-5 seconds. _12__ reps per set, _3__ sets per day on both sides.  http://ecce.exer.us/64   Copyright  VHI. All rights reserved.

## 2015-04-06 ENCOUNTER — Encounter: Payer: Medicare Other | Admitting: Occupational Therapy

## 2015-04-08 ENCOUNTER — Encounter: Payer: Self-pay | Admitting: Rehabilitation

## 2015-04-08 ENCOUNTER — Encounter: Payer: Medicare Other | Admitting: Occupational Therapy

## 2015-04-08 ENCOUNTER — Ambulatory Visit: Payer: Medicare Other | Admitting: Rehabilitation

## 2015-04-08 DIAGNOSIS — IMO0002 Reserved for concepts with insufficient information to code with codable children: Secondary | ICD-10-CM

## 2015-04-08 DIAGNOSIS — Z7409 Other reduced mobility: Secondary | ICD-10-CM

## 2015-04-08 DIAGNOSIS — R2681 Unsteadiness on feet: Secondary | ICD-10-CM

## 2015-04-08 DIAGNOSIS — R269 Unspecified abnormalities of gait and mobility: Secondary | ICD-10-CM

## 2015-04-08 NOTE — Therapy (Signed)
[facility] [address] [place], [place], [address] Phone: [phone]   Fax:  [phone]  Physical Therapy Treatment  Patient Details  Name: Todd [name] MRN: [id number] Date of Birth: 12-24-1940 Referring Provider: Burnetta Sabin, NP  Encounter Date: 04/08/2015      PT End of Session - 04/08/15 1322    Visit Number 14   Number of Visits 25   Date for PT Re-Evaluation 05/08/15  through new POC   Authorization Type [employer] MCR-Gcodes on every 10th visit.    PT Start Time 1315   PT Stop Time 1400   PT Time Calculation (min) 45 min   Equipment Utilized During Treatment Gait belt   Activity Tolerance Patient limited by fatigue;Patient tolerated treatment well   Behavior During Therapy [facility] for tasks assessed/performed      Past Medical History  Diagnosis Date  . PONV (postoperative nausea and vomiting)   . Hyperlipidemia     takes Crestor daily  . Hypertension     takes Amlodipine and Metoprolol  . Coronary artery disease   . 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 Joint pain     knees,elbows,back  . Diabetes mellitus     lantus and novolog;fasting sugars run 70-120  . CKD (chronic kidney disease)     Past Surgical History  Procedure Laterality Date  . Knee arthroscopy  80's    right knee  . Cholecystectomy  20+yrs ago  . Cardiac catheterization  01/2006    2 stents placed  . Back surgery  09/15/10  . Cataract extraction  12/21/10--02/01/11    right and left  . Elbow surgery  2012    right elbow  . Colonoscopy    . Ulnar nerve transposition  03/02/2011    Procedure: ULNAR NERVE DECOMPRESSION/TRANSPOSITION;  Surgeon: Peggyann Shoals, MD;  Location: [facility];  Service: Neurosurgery;  Laterality: Left;  LEFT ulnar nerve decompression  . Total hip arthroplasty      There were no vitals filed for this visit.  Visit Diagnosis:  Abnormality of gait - Plan: PT plan of care cert/re-cert  Unsteadiness - Plan:  PT plan of care cert/re-cert  Impaired functional mobility and activity tolerance - Plan: PT plan of care cert/re-cert  Lack of coordination due to stroke - Plan: PT plan of care cert/re-cert      Subjective Assessment - 04/08/15 1320    Subjective Pt reports no changes, reports walking a lot at home and wife states he uses cane when he feels fatigued.    Limitations Standing;Walking;House hold activities   Patient Stated Goals to get back to being independent   Currently in Pain? No/denies            [facility] PT Assessment - 04/08/15 1339    6 minute walk test results    Aerobic Endurance Distance Walked 1138   Endurance additional comments without AD vs 1186' with RW on 03/08/15           TE:  Assessed compliance with HEP.  Pt states that he doesn't have trouble with them, but doesn't really do the standing exercises anymore because he is walking more and is doing clam exercise only once a day.  Encouraged him to continue exercise to strengthening BLE and B hip strength.  Pt verbalized understanding.  Also performed 6MWT without AD with new distance of 1138', which is decreased from previous distance, however note that this time he performed without  use of AD, demonstrating increased endurance and improved functional mobility.    Gait:  Assessed gait outdoors on varying surfaces up to 1000' (with standing rest break for several mins at 500') at S level.  Note pt with initial mild LOB when adjusting to incline however able to self correct and did better with other changes in surfaces.  He was also able to ambulate in grass at S level during session.  Cues for increased L step length and upright posture with continued breathing throughout.    Self Care: Based on performance discussed with pt and wife that he can begin to ambulate short distances on paved surfaces without cane with S of wife and recommend he begin short bouts of returning to golf, remaining on the green or fairway,  using cane to get there and having wife for S at this time.  Both verbalized understanding.  Education on remaining goals and addition of goal for next 4 weeks.  Pt and wife verbalized understanding.                   PT Education - 04/08/15 1322    Education provided Yes   Education Details to begin short paved distances without cane outside with S from wife, beginning to get back into golf while using cane to get to green, having S from wife and utilizing golf cart to prevent over fatigue.    Person(s) Educated Patient   Methods Explanation   Comprehension Verbalized understanding          PT Short Term Goals - 04/01/15 1251    PT SHORT TERM GOAL #1   Title Pt will initiate HEP for RLE strengthening and balance to indicate decreased fall risk and improved functional mobility.  (Target Date: 03/09/15)   Baseline Met 12/2.   Status Achieved   PT SHORT TERM GOAL #2   Title Pt will perform 6MWT and increase score by 150' in order to indicate functional improvement in endurance. (Target Date: 03/09/15)   Baseline 03/08/15: 1186 feet in 6 minutes with RW with supervision   Status Achieved   PT SHORT TERM GOAL #3   Title Pt will verbalize compliance with walking program to indicate improvement in functional endurance.  (Target Date: 03/09/15)   Baseline 03/08/15: walking at home with walker daily, up to 6 minutes on occasion, otherwise around 4 mintues. walking several times a day.    Status Achieved   PT SHORT TERM GOAL #4   Title Pt will increase gait speed to 2.31 ft/sec in order to indicate decreased fall risk and more efficient gait.  (Target Date: 03/09/15)   Baseline 12/22: gait velocity = 3.14 ft/sec with RW   Status Achieved   PT SHORT TERM GOAL #5   Title Pt will increase DGI to 13/24 in order to indicate decreased fall risk and functional improvemnet in balance. (Target Date: 03/09/15)   Baseline 03/08/15: scored 15/24 today with use of cane   Status Achieved    PT SHORT TERM GOAL #6   Title Pt will ambulate 300' on indoor surfaces with SPC at mod I level in order to indicate safe home negotiation.  (Target Date: 03/09/15)   Baseline met on 03/08/15   Status Achieved           PT Long Term Goals - 04/08/15 1323    PT LONG TERM GOAL #1   Title Pt will be independent with HEP in order to indicate improved RLE strength, balance and  decreased fall risk. (Target Date: 04/06/15)   Baseline met 04/08/15   Status Achieved   PT LONG TERM GOAL #2   Title Pt will increase 6MWT distance by 150' in order to indicate improvement in functional endurance.  (Modified Target Date: 05/06/15)   Baseline 1138' without AD (was 1186' on 03/08/15 but that was with RW)   Status Partially Met   PT LONG TERM GOAL #3   Title Pt will increase DGI to 20/24 in order to indicate decreased fall risk and functional improvement in balance. (Modified Target Date: 05/06/15)   Baseline 1/12: DGI score = 18/24   Status Partially Met   PT LONG TERM GOAL #4   Title Pt will verbalize return to community fitness/leisure activity to indicate return to prior level of function. (Modified Target Date: 05/06/15)   Status On-going   PT LONG TERM GOAL #5   Title Pt will ambulate >500' on outdoor surfaces (curb and ramp included) without AD at mod I level in order to indicate safe community negotiation. Modified Date: 05/06/15)   Baseline S for >500' on paved surfaces 04/08/15   Status On-going   Additional Long Term Goals   Additional Long Term Goals Yes   PT LONG TERM GOAL #6   Title Pt will ambulate >200' without AD while carrying object up to 15lbs in order to indicate safe return to work duties within the home.  (Target Date: 05/06/15)               Plan - 04/08/15 1323    Clinical Impression Statement Skilled session focused on assessing remainder of LTGs.  Note he has met 1/5, partially meeting 2 other LTGs (note that he did not meet 6MWT as he used no AD this time and RW on last  performance) and is 2 points away from meeting DGI goal.  Will continue these goals and PT added one more goal to ambulate while carrying object to simulate work duties at home.    Pt will benefit from skilled therapeutic intervention in order to improve on the following deficits Abnormal gait;Decreased activity tolerance;Decreased balance;Decreased coordination;Decreased endurance;Decreased mobility;Decreased strength;Impaired perceived functional ability;Impaired sensation;Impaired UE functional use;Postural dysfunction   Rehab Potential Excellent   PT Frequency 2x / week   PT Duration 8 weeks   PT Treatment/Interventions ADLs/Self Care Home Management;Electrical Stimulation;DME Instruction;Gait training;Stair training;Functional mobility training;Therapeutic activities;Therapeutic exercise;Balance training;Neuromuscular re-education;Patient/family education;Orthotic Fit/Training;Visual/perceptual remediation/compensation   PT Next Visit Plan Midline orientation on compliant surfaces, RLE strengthening, balance (SLS/lateral wt. shifting), gait without SPC on outdoor/compliant surfaces. Work towards [employer].     Consulted and Agree with Plan of Care Patient;Family member/caregiver   Family Member Consulted wife-[name]        Problem List Patient Active Problem List   Diagnosis Date Noted  . Thalamic hemorrhage ([place])   . ICH (intracerebral hemorrhage) ([place]) 02/01/2015  . Abdominal pain 01/25/2015  . Acute hepatitis   . Enteritis   . Ileus ([place])   . Uncontrollable vomiting     Cameron Sprang, PT, MPT [facility] [address] [place], [place], [address] Phone: [phone]   Fax:  [phone] 04/08/2015, 2:33 PM  Name: Todd [name] MRN: [id number] Date of Birth: 22-Sep-1940

## 2015-04-09 ENCOUNTER — Encounter: Payer: Medicare Other | Admitting: Occupational Therapy

## 2015-04-09 ENCOUNTER — Encounter: Payer: Self-pay | Admitting: Rehabilitation

## 2015-04-09 ENCOUNTER — Ambulatory Visit: Payer: Medicare Other | Admitting: Rehabilitation

## 2015-04-09 DIAGNOSIS — IMO0002 Reserved for concepts with insufficient information to code with codable children: Secondary | ICD-10-CM

## 2015-04-09 DIAGNOSIS — R269 Unspecified abnormalities of gait and mobility: Secondary | ICD-10-CM

## 2015-04-09 DIAGNOSIS — R2681 Unsteadiness on feet: Secondary | ICD-10-CM

## 2015-04-09 DIAGNOSIS — Z7409 Other reduced mobility: Secondary | ICD-10-CM | POA: Diagnosis not present

## 2015-04-09 NOTE — Therapy (Signed)
[facility] [address] [place], [place], [address] Phone: [phone]   Fax:  [phone]  Physical Therapy Treatment  Patient Details  Name: Todd [name] MRN: [id number] Date of Birth: 1940/07/07 Referring Provider: Burnetta Sabin, NP  Encounter Date: 04/09/2015      PT End of Session - 04/09/15 1509    Visit Number 15   Number of Visits 25   Date for PT Re-Evaluation 05/08/15  through new POC   Authorization Type [employer] MCR-Gcodes on every 10th visit.    PT Start Time 1147   PT Stop Time 1230   PT Time Calculation (min) 43 min   Equipment Utilized During Treatment Gait belt   Activity Tolerance Patient limited by fatigue;Patient tolerated treatment well   Behavior During Therapy WFL for tasks assessed/performed      Past Medical History  Diagnosis Date  . PONV (postoperative nausea and vomiting)   . Hyperlipidemia     takes Crestor daily  . Hypertension     takes Amlodipine and Metoprolol  . Coronary artery disease   . 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 Joint pain     knees,elbows,back  . Diabetes mellitus     lantus and novolog;fasting sugars run 70-120  . CKD (chronic kidney disease)     Past Surgical History  Procedure Laterality Date  . Knee arthroscopy  80's    right knee  . Cholecystectomy  20+yrs ago  . Cardiac catheterization  01/2006    2 stents placed  . Back surgery  09/15/10  . Cataract extraction  12/21/10--02/01/11    right and left  . Elbow surgery  2012    right elbow  . Colonoscopy    . Ulnar nerve transposition  03/02/2011    Procedure: ULNAR NERVE DECOMPRESSION/TRANSPOSITION;  Surgeon: Peggyann Shoals, MD;  Location: [facility];  Service: Neurosurgery;  Laterality: Left;  LEFT ulnar nerve decompression  . Total hip arthroplasty      There were no vitals filed for this visit.  Visit Diagnosis:  Abnormality of gait  Unsteadiness  Lack of coordination due to stroke       Subjective Assessment - 04/09/15 1153    Subjective "I'm a little sore today."    Limitations Standing;Walking;House hold activities   Patient Stated Goals to get back to being independent   Currently in Pain? Yes   Pain Score 4    Pain Location Leg   Pain Orientation Left   Pain Descriptors / Indicators Sore   Pain Type Acute pain   Pain Onset Yesterday   Pain Frequency Intermittent   Aggravating Factors  overuse (from yesterdays session)                NMR:  Performed high level balance/gait while on red foam mat as follows; marching x 3 reps (forwards and backwards) with emphasis on slow movements to increase balance challenge and SLS.  Note that pt tends to have increased trunk compensatory movements and decreased weight shift to the R, therefore provided mirror during remaining mat exercises to increase visual feedback on posture as well as provided tactile facilitation at trunk to decrease movements and increase R weight shift. Progressed to tandem walking x 3 reps (forwards and backwards) and again, note increased trunk movement and decreased ability to weight shift R.  Pt very frustrated with himself, however provided encouragement that tasks are more difficult as he is progressing.    TA:  Provided L IT band stretch, glute stretch (knee to chest) and L hamstring stretch during session x 1-2 mins prior to beginning balance activities due to increased soreness in LLE from yesterdays PT session.  Pt verbalized feeling better following stretching.  Ended session with education and performance of floor recovery.  Provided demonstration and verbal cues on how to perform safely.  Pt able to return demonstration at S level with min cues for attention to RUE when transitioning into quadruped for safety.   Also cues to not stand fully to avoid feeling dizzy/falling again and to take transitions slow and breath.                    PT Education - 04/09/15 1508    Education  provided Yes   Education Details Education on floor recovery and the need to challenge pt when in therapy to continue progress.    Person(s) Educated Patient;Spouse   Methods Explanation   Comprehension Verbalized understanding          PT Short Term Goals - 04/01/15 1251    PT SHORT TERM GOAL #1   Title Pt will initiate HEP for RLE strengthening and balance to indicate decreased fall risk and improved functional mobility.  (Target Date: 03/09/15)   Baseline Met 12/2.   Status Achieved   PT SHORT TERM GOAL #2   Title Pt will perform 6MWT and increase score by 150' in order to indicate functional improvement in endurance. (Target Date: 03/09/15)   Baseline 03/08/15: 1186 feet in 6 minutes with RW with supervision   Status Achieved   PT SHORT TERM GOAL #3   Title Pt will verbalize compliance with walking program to indicate improvement in functional endurance.  (Target Date: 03/09/15)   Baseline 03/08/15: walking at home with walker daily, up to 6 minutes on occasion, otherwise around 4 mintues. walking several times a day.    Status Achieved   PT SHORT TERM GOAL #4   Title Pt will increase gait speed to 2.31 ft/sec in order to indicate decreased fall risk and more efficient gait.  (Target Date: 03/09/15)   Baseline 12/22: gait velocity = 3.14 ft/sec with RW   Status Achieved   PT SHORT TERM GOAL #5   Title Pt will increase DGI to 13/24 in order to indicate decreased fall risk and functional improvemnet in balance. (Target Date: 03/09/15)   Baseline 03/08/15: scored 15/24 today with use of cane   Status Achieved   PT SHORT TERM GOAL #6   Title Pt will ambulate 300' on indoor surfaces with SPC at mod I level in order to indicate safe home negotiation.  (Target Date: 03/09/15)   Baseline met on 03/08/15   Status Achieved           PT Long Term Goals - 04/08/15 1323    PT LONG TERM GOAL #1   Title Pt will be independent with HEP in order to indicate improved RLE strength,  balance and decreased fall risk. (Target Date: 04/06/15)   Baseline met 04/08/15   Status Achieved   PT LONG TERM GOAL #2   Title Pt will increase 6MWT distance by 150' in order to indicate improvement in functional endurance.  (Modified Target Date: 05/06/15)   Baseline 1138' without AD (was 1186' on 03/08/15 but that was with RW)   Status Partially Met   PT LONG TERM GOAL #3   Title Pt will increase DGI to 20/24 in order to indicate decreased  fall risk and functional improvement in balance. (Modified Target Date: 05/06/15)   Baseline 1/12: DGI score = 18/24   Status Partially Met   PT LONG TERM GOAL #4   Title Pt will verbalize return to community fitness/leisure activity to indicate return to prior level of function. (Modified Target Date: 05/06/15)   Status On-going   PT LONG TERM GOAL #5   Title Pt will ambulate >500' on outdoor surfaces (curb and ramp included) without AD at mod I level in order to indicate safe community negotiation. Modified Date: 05/06/15)   Baseline S for >500' on paved surfaces 04/08/15   Status On-going   Additional Long Term Goals   Additional Long Term Goals Yes   PT LONG TERM GOAL #6   Title Pt will ambulate >200' without AD while carrying object up to 15lbs in order to indicate safe return to work duties within the home.  (Target Date: 05/06/15)               Plan - 04/09/15 1510    Clinical Impression Statement Skilled session focused on LLE stretching prior to beginning balance work due to reports of increased soreness in hip.  Remainder of session focused on high level balance and floor recovery.  Pt tolerated well with increased breaks for fatigue.     Pt will benefit from skilled therapeutic intervention in order to improve on the following deficits Abnormal gait;Decreased activity tolerance;Decreased balance;Decreased coordination;Decreased endurance;Decreased mobility;Decreased strength;Impaired perceived functional ability;Impaired sensation;Impaired  UE functional use;Postural dysfunction   Rehab Potential Excellent   PT Frequency 2x / week   PT Duration 8 weeks   PT Treatment/Interventions ADLs/Self Care Home Management;Electrical Stimulation;DME Instruction;Gait training;Stair training;Functional mobility training;Therapeutic activities;Therapeutic exercise;Balance training;Neuromuscular re-education;Patient/family education;Orthotic Fit/Training;Visual/perceptual remediation/compensation   PT Next Visit Plan Midline orientation on compliant surfaces, RLE strengthening, balance (SLS/lateral wt. shifting), gait without SPC on outdoor/compliant surfaces. Work towards [employer].     Consulted and Agree with Plan of Care Patient;Family member/caregiver   Family Member Consulted wife-[name]        Problem List Patient Active Problem List   Diagnosis Date Noted  . Thalamic hemorrhage ([place])   . ICH (intracerebral hemorrhage) ([place]) 02/01/2015  . Abdominal pain 01/25/2015  . Acute hepatitis   . Enteritis   . Ileus ([place])   . Uncontrollable vomiting     Cameron Sprang, PT, MPT [facility] [address] [place], [place], [address] Phone: [phone]   Fax:  [phone] 04/09/2015, 3:12 PM  Name: Todd [name] MRN: [id number] Date of Birth: 05/08/1940

## 2015-04-13 ENCOUNTER — Encounter: Payer: Medicare Other | Admitting: Occupational Therapy

## 2015-04-13 ENCOUNTER — Ambulatory Visit: Payer: Medicare Other | Admitting: Physical Therapy

## 2015-04-13 DIAGNOSIS — R2681 Unsteadiness on feet: Secondary | ICD-10-CM

## 2015-04-13 DIAGNOSIS — R269 Unspecified abnormalities of gait and mobility: Secondary | ICD-10-CM

## 2015-04-13 DIAGNOSIS — Z7409 Other reduced mobility: Secondary | ICD-10-CM | POA: Diagnosis not present

## 2015-04-13 DIAGNOSIS — G8193 Hemiplegia, unspecified affecting right nondominant side: Secondary | ICD-10-CM

## 2015-04-13 NOTE — Therapy (Signed)
[facility] [address] [place], [place], [address] Phone: [phone]   Fax:  [phone]  Physical Therapy Treatment  Patient Details  Name: Todd [name] MRN: [id number] Date of Birth: 06/07/1940 Referring Provider: Sharon Biby, NP  Encounter Date: 04/13/2015      PT End of Session - 04/13/15 1314    Visit Number 16   Number of Visits 25   Date for PT Re-Evaluation 05/08/15   Authorization Type [employer] MCR-Gcodes on every 10th visit.    PT Start Time 1100   PT Stop Time 1147   PT Time Calculation (min) 47 min   Equipment Utilized During Treatment Gait belt   Activity Tolerance Patient limited by fatigue;Patient tolerated treatment well   Behavior During Therapy WFL for tasks assessed/performed      Past Medical History  Diagnosis Date  . PONV (postoperative nausea and vomiting)   . Hyperlipidemia     takes Crestor daily  . Hypertension     takes Amlodipine and Metoprolol  . Coronary artery disease   . Aneurysm (HCC) 2007    pseudo  . Joint pain     knees,elbows,back  . Diabetes mellitus     lantus and novolog;fasting sugars run 70-120  . CKD (chronic kidney disease)     Past Surgical History  Procedure Laterality Date  . Knee arthroscopy  80's    right knee  . Cholecystectomy  20+yrs ago  . Cardiac catheterization  01/2006    2 stents placed  . Back surgery  09/15/10  . Cataract extraction  12/21/10--02/01/11    right and left  . Elbow surgery  2012    right elbow  . Colonoscopy    . Ulnar nerve transposition  03/02/2011    Procedure: ULNAR NERVE DECOMPRESSION/TRANSPOSITION;  Surgeon: Joseph D Stern, MD;  Location: [facility];  Service: Neurosurgery;  Laterality: Left;  LEFT ulnar nerve decompression  . Total hip arthroplasty      There were no vitals filed for this visit.  Visit Diagnosis:  Abnormality of gait  Unsteadiness  Hemiplegia affecting right nondominant side  (HCC)                       [facility] Adult PT Treatment/Exercise - 04/13/15 0001    Ambulation/Gait   Ambulation/Gait Yes   Ambulation/Gait Assistance 5: Supervision   Ambulation/Gait Assistance Details Performed multiple gait trials x30' without AD with pt negotiating obstacles (over compliant surfaces, 4", 6", and 8" steps; over ski poles, and around cones) to simulate obstacle negotiation. Traversed obstacle course x5 trials without AD with min guard, cueing to maintain BOS, ascend step with LLE leading, and to decrease gait velocity to increase safety. Then, negotiation same obstacle course (without 8" step) with pt carrying 2 lbs in binders/file folders then x3 trials with 5 lbs in files/folders. When carrying files/folders, noted increased LE scissoring when stepping around obstacles. Also noted RLE circumduction when stepping over obstacles.   Ambulation Distance (Feet) 850 Feet  500' outdoors; 350' indoors   Assistive device None   Gait Pattern Step-through pattern;Trunk flexed;Trendelenburg;Lateral hip instability   Ambulation Surface Level;Unlevel;Indoor;Outdoor;Paved   Gait Comments Gait x500' outdoors without AD with supervision, no overt LOB with focus on paced breathing throughout. Noted improved gait stability, safe gait velocity, and no overt LOB during this trial.             Balance Exercises - 04/13/15 1310    Balance   Exercises: Standing   Step Over Hurdles / Cones Stepping over hurdles of varying height x12 total; forward x4 trials, laterally x1 trial leading with each LE. When negotiating forward, noted RLE circumduction.             PT Short Term Goals - 04/01/15 1251    PT SHORT TERM GOAL #1   Title Pt will initiate HEP for RLE strengthening and balance to indicate decreased fall risk and improved functional mobility.  (Target Date: 03/09/15)   Baseline Met 12/2.   Status Achieved   PT SHORT TERM GOAL #2   Title Pt will perform 6MWT and  increase score by 150' in order to indicate functional improvement in endurance. (Target Date: 03/09/15)   Baseline 03/08/15: 1186 feet in 6 minutes with RW with supervision   Status Achieved   PT SHORT TERM GOAL #3   Title Pt will verbalize compliance with walking program to indicate improvement in functional endurance.  (Target Date: 03/09/15)   Baseline 03/08/15: walking at home with walker daily, up to 6 minutes on occasion, otherwise around 4 mintues. walking several times a day.    Status Achieved   PT SHORT TERM GOAL #4   Title Pt will increase gait speed to 2.31 ft/sec in order to indicate decreased fall risk and more efficient gait.  (Target Date: 03/09/15)   Baseline 12/22: gait velocity = 3.14 ft/sec with RW   Status Achieved   PT SHORT TERM GOAL #5   Title Pt will increase DGI to 13/24 in order to indicate decreased fall risk and functional improvemnet in balance. (Target Date: 03/09/15)   Baseline 03/08/15: scored 15/24 today with use of cane   Status Achieved   PT SHORT TERM GOAL #6   Title Pt will ambulate 300' on indoor surfaces with SPC at mod I level in order to indicate safe home negotiation.  (Target Date: 03/09/15)   Baseline met on 03/08/15   Status Achieved           PT Long Term Goals - 04/08/15 1323    PT LONG TERM GOAL #1   Title Pt will be independent with HEP in order to indicate improved RLE strength, balance and decreased fall risk. (Target Date: 04/06/15)   Baseline met 04/08/15   Status Achieved   PT LONG TERM GOAL #2   Title Pt will increase 6MWT distance by 150' in order to indicate improvement in functional endurance.  (Modified Target Date: 05/06/15)   Baseline 1138' without AD (was 1186' on 03/08/15 but that was with RW)   Status Partially Met   PT LONG TERM GOAL #3   Title Pt will increase DGI to 20/24 in order to indicate decreased fall risk and functional improvement in balance. (Modified Target Date: 05/06/15)   Baseline 1/12: DGI score = 18/24    Status Partially Met   PT LONG TERM GOAL #4   Title Pt will verbalize return to community fitness/leisure activity to indicate return to prior level of function. (Modified Target Date: 05/06/15)   Status On-going   PT LONG TERM GOAL #5   Title Pt will ambulate >500' on outdoor surfaces (curb and ramp included) without AD at mod I level in order to indicate safe community negotiation. Modified Date: 05/06/15)   Baseline S for >500' on paved surfaces 04/08/15   Status On-going   Additional Long Term Goals   Additional Long Term Goals Yes   PT LONG TERM GOAL #6   Title Pt   will ambulate >200' without AD while carrying object up to 15lbs in order to indicate safe return to work duties within the home.  (Target Date: 05/06/15)               Plan - 04/13/15 1315    Clinical Impression Statement Session focused on gait training with obstacle negotiation while carrying files/folders to simulate mobility in home environment. Noted RLE circumduction while stepping over obstacles and LE scissoring (when carrying files) whien stepping around/between obstacles.    Pt will benefit from skilled therapeutic intervention in order to improve on the following deficits Abnormal gait;Decreased activity tolerance;Decreased balance;Decreased coordination;Decreased endurance;Decreased mobility;Decreased strength;Impaired perceived functional ability;Impaired sensation;Impaired UE functional use;Postural dysfunction   Rehab Potential Excellent   PT Frequency 2x / week   PT Duration 8 weeks   PT Treatment/Interventions ADLs/Self Care Home Management;Electrical Stimulation;DME Instruction;Gait training;Stair training;Functional mobility training;Therapeutic activities;Therapeutic exercise;Balance training;Neuromuscular re-education;Patient/family education;Orthotic Fit/Training;Visual/perceptual remediation/compensation   PT Next Visit Plan Gait without SPC on outdoor/compliant surfaces; carrying objects during  mobility. Work towards LTGs.     Consulted and Agree with Plan of Care Patient;Family member/caregiver   Family Member Consulted wife-[name]        Problem List Patient Active Problem List   Diagnosis Date Noted  . Thalamic hemorrhage (HCC)   . ICH (intracerebral hemorrhage) (HCC) 02/01/2015  . Abdominal pain 01/25/2015  . Acute hepatitis   . Enteritis   . Ileus (HCC)   . Uncontrollable vomiting    Blair Hobble, PT, DPT [facility] [address] [place], [place], [address] Phone: [phone]   Fax:  [phone] 04/13/2015, 1:17 PM   Name: Todd [name] MRN: [id number] Date of Birth: 01/13/1941     

## 2015-04-15 ENCOUNTER — Encounter: Payer: Medicare Other | Admitting: Occupational Therapy

## 2015-04-15 ENCOUNTER — Ambulatory Visit: Payer: Self-pay | Admitting: Neurology

## 2015-04-16 ENCOUNTER — Encounter: Payer: Self-pay | Admitting: Rehabilitation

## 2015-04-16 ENCOUNTER — Ambulatory Visit: Payer: Medicare Other | Admitting: Rehabilitation

## 2015-04-16 DIAGNOSIS — G8193 Hemiplegia, unspecified affecting right nondominant side: Secondary | ICD-10-CM

## 2015-04-16 DIAGNOSIS — IMO0002 Reserved for concepts with insufficient information to code with codable children: Secondary | ICD-10-CM

## 2015-04-16 DIAGNOSIS — R269 Unspecified abnormalities of gait and mobility: Secondary | ICD-10-CM

## 2015-04-16 DIAGNOSIS — Z7409 Other reduced mobility: Secondary | ICD-10-CM | POA: Diagnosis not present

## 2015-04-16 DIAGNOSIS — R2681 Unsteadiness on feet: Secondary | ICD-10-CM

## 2015-04-16 NOTE — Therapy (Signed)
[facility] [address] [place], [place], [address] Phone: [phone]   Fax:  [phone]  Physical Therapy Treatment  Patient Details  Name: Todd [name] MRN: [id number] Date of Birth: [birth year] Referring Provider: Burnetta Sabin, NP  Encounter Date: 04/16/2015      PT End of Session - 04/16/15 1150    Visit Number 17   Number of Visits 25   Date for PT Re-Evaluation 05/08/15   Authorization Type [employer] MCR-Gcodes on every 10th visit.    PT Start Time 1145   PT Stop Time 1230   PT Time Calculation (min) 45 min   Equipment Utilized During Treatment Gait belt   Activity Tolerance Patient limited by fatigue;Patient tolerated treatment well   Behavior During Therapy WFL for tasks assessed/performed      Past Medical History  Diagnosis Date  . PONV (postoperative nausea and vomiting)   . Hyperlipidemia     takes Crestor daily  . Hypertension     takes Amlodipine and Metoprolol  . Coronary artery disease   . 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 Joint pain     knees,elbows,back  . Diabetes mellitus     lantus and novolog;fasting sugars run 70-120  . CKD (chronic kidney disease)     Past Surgical History  Procedure Laterality Date  . Knee arthroscopy  80's    right knee  . Cholecystectomy  20+yrs ago  . Cardiac catheterization  01/2006    2 stents placed  . Back surgery  09/15/10  . Cataract extraction  12/21/10--02/01/11    right and left  . Elbow surgery  2012    right elbow  . Colonoscopy    . Ulnar nerve transposition  03/02/2011    Procedure: ULNAR NERVE DECOMPRESSION/TRANSPOSITION;  Surgeon: Peggyann Shoals, MD;  Location: [facility];  Service: Neurosurgery;  Laterality: Left;  LEFT ulnar nerve decompression  . Total hip arthroplasty      There were no vitals filed for this visit.  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    Patient is accompained by: Family member   Limitations Standing;Walking;House hold activities   Patient Stated Goals to get back to being independent   Currently in Pain? No/denies         NMR: Worked on high level balance and gait while carrying obstacle to better challenge balance and simulate carrying objects at home.  Had pt carry crate with gait over controlled surface at mod I level, then had pt ambulate through obstacles, onto foam mat (double stacked), and over orange barriers.  Performed at S level and min/guard to min A level when stepping over orange barriers.  Progressed to having pt stand on stacked foam mat while in RLE stance and LLE tapping to 3 cones before returning to standing position for WB and SLS.  Continues to demonstrate trunk compensations during balance with tactile cues for maintaining stable trunk and improved weight shift.  Note increased difficulty when having to stand on LLE and tap RLE to cones due to uncoordinated movements in RLE and trunk.  Performed x 5 reps on BLE.  Progressed to sit<>stand while feet placed on foam balance beam.  Pt with increased impulsivity to complete motion, and decreased  weight shift to the R, therefore provided mirror for increased visual feedback.  Provided tactile and verbal cues for slower weight shift, increased B hip extension (esp on R side) and upright posture.  Performed x 10 reps but continued to note pt tends to use back of LEs to press into and locks out R knee to compensate for balance.  Performed wall bumps while standing on foam balance beam x 10 reps to continue to elicit hip strategy and hip strength as well as proximal stability when standing on foam beam.  Tolerated well.     Gait:  Ended session with gait outdoors >500' without AD at S level.  Encouraged pt to begin to ambulate into/out of  clinic without AD with S from wife.  Both verbalized understanding.                           PT Education - 04/16/15 1150    Education provided Yes   Education Details education on ambulating into/out of clinic without cane from now on with S from wife.    Person(s) Educated Patient;Spouse   Methods Explanation   Comprehension Verbalized understanding          PT Short Term Goals - 04/01/15 1251    PT SHORT TERM GOAL #1   Title Pt will initiate HEP for RLE strengthening and balance to indicate decreased fall risk and improved functional mobility.  (Target Date: 03/09/15)   Baseline Met 12/2.   Status Achieved   PT SHORT TERM GOAL #2   Title Pt will perform 6MWT and increase score by 150' in order to indicate functional improvement in endurance. (Target Date: 03/09/15)   Baseline 03/08/15: 1186 feet in 6 minutes with RW with supervision   Status Achieved   PT SHORT TERM GOAL #3   Title Pt will verbalize compliance with walking program to indicate improvement in functional endurance.  (Target Date: 03/09/15)   Baseline 03/08/15: walking at home with walker daily, up to 6 minutes on occasion, otherwise around 4 mintues. walking several times a day.    Status Achieved   PT SHORT TERM GOAL #4   Title Pt will increase gait speed to 2.31 ft/sec in order to indicate decreased fall risk and more efficient gait.  (Target Date: 03/09/15)   Baseline 12/22: gait velocity = 3.14 ft/sec with RW   Status Achieved   PT SHORT TERM GOAL #5   Title Pt will increase DGI to 13/24 in order to indicate decreased fall risk and functional improvemnet in balance. (Target Date: 03/09/15)   Baseline 03/08/15: scored 15/24 today with use of cane   Status Achieved   PT SHORT TERM GOAL #6   Title Pt will ambulate 300' on indoor surfaces with SPC at mod I level in order to indicate safe home negotiation.  (Target Date: 03/09/15)   Baseline met on 03/08/15   Status Achieved            PT Long Term Goals - 04/08/15 1323    PT LONG TERM GOAL #1   Title Pt will be independent with HEP in order to indicate improved RLE strength, balance and decreased fall risk. (Target Date: 04/06/15)   Baseline met 04/08/15   Status Achieved   PT LONG TERM GOAL #2   Title Pt will increase 6MWT distance by 150' in order to indicate improvement in functional endurance.  (Modified Target Date: 05/06/15)   Baseline 1138' without AD (  was 61' on 03/08/15 but that was with RW)   Status Partially Met   PT LONG TERM GOAL #3   Title Pt will increase DGI to 20/24 in order to indicate decreased fall risk and functional improvement in balance. (Modified Target Date: 05/06/15)   Baseline 1/12: DGI score = 18/24   Status Partially Met   PT LONG TERM GOAL #4   Title Pt will verbalize return to community fitness/leisure activity to indicate return to prior level of function. (Modified Target Date: 05/06/15)   Status On-going   PT LONG TERM GOAL #5   Title Pt will ambulate >500' on outdoor surfaces (curb and ramp included) without AD at mod I level in order to indicate safe community negotiation. Modified Date: 05/06/15)   Baseline S for >500' on paved surfaces 04/08/15   Status On-going   Additional Long Term Goals   Additional Long Term Goals Yes   PT LONG TERM GOAL #6   Title Pt will ambulate >200' without AD while carrying object up to 15lbs in order to indicate safe return to work duties within the home.  (Target Date: 05/06/15)               Plan - 04/16/15 1150    Clinical Impression Statement Skilled session focused on high level balance, continued negotiation of obstacles while carrying crate, SLS activities and gait outdoors without AD.    Pt will benefit from skilled therapeutic intervention in order to improve on the following deficits Abnormal gait;Decreased activity tolerance;Decreased balance;Decreased coordination;Decreased endurance;Decreased mobility;Decreased strength;Impaired  perceived functional ability;Impaired sensation;Impaired UE functional use;Postural dysfunction   Rehab Potential Excellent   PT Frequency 2x / week   PT Duration 8 weeks   PT Treatment/Interventions ADLs/Self Care Home Management;Electrical Stimulation;DME Instruction;Gait training;Stair training;Functional mobility training;Therapeutic activities;Therapeutic exercise;Balance training;Neuromuscular re-education;Patient/family education;Orthotic Fit/Training;Visual/perceptual remediation/compensation   PT Next Visit Plan Gait without SPC on outdoor/compliant surfaces; carrying objects during mobility. Work towards [employer].     Consulted and Agree with Plan of Care Patient;Family member/caregiver   Family Member Consulted wife-[name]        Problem List Patient Active Problem List   Diagnosis Date Noted  . Thalamic hemorrhage ([place])   . ICH (intracerebral hemorrhage) ([place]) 02/01/2015  . Abdominal pain 01/25/2015  . Acute hepatitis   . Enteritis   . Ileus ([place])   . Uncontrollable vomiting     Cameron Sprang, PT, MPT [facility] [address] [place], [place], [address] Phone: [phone]   Fax:  [phone] 04/16/2015, 1:16 PM  Name: ALEXANDE SHEERIN MRN: [id number] Date of Birth: September 07, 1940

## 2015-04-19 ENCOUNTER — Ambulatory Visit: Payer: Medicare Other | Admitting: Physical Therapy

## 2015-04-19 DIAGNOSIS — G8193 Hemiplegia, unspecified affecting right nondominant side: Secondary | ICD-10-CM

## 2015-04-19 DIAGNOSIS — R269 Unspecified abnormalities of gait and mobility: Secondary | ICD-10-CM

## 2015-04-19 DIAGNOSIS — R2681 Unsteadiness on feet: Secondary | ICD-10-CM

## 2015-04-19 DIAGNOSIS — Z7409 Other reduced mobility: Secondary | ICD-10-CM | POA: Diagnosis not present

## 2015-04-19 NOTE — Therapy (Signed)
[facility] [address] [place], [place], [address] Phone: [phone]   Fax:  [phone]  Physical Therapy Treatment  Patient Details  Name: Todd [name] MRN: [id number] Date of Birth: November 21, 1940 Referring Provider: Burnetta Sabin, NP  Encounter Date: 04/19/2015      PT End of Session - 04/19/15 1252    Visit Number 18   Number of Visits 25   Date for PT Re-Evaluation 05/08/15   Authorization Type [employer] MCR-Gcodes on every 10th visit.    PT Start Time 1147   PT Stop Time 1230   PT Time Calculation (min) 43 min   Equipment Utilized During Treatment Gait belt   Activity Tolerance Patient limited by fatigue;Patient tolerated treatment well  frequent seated rest breaks due to fatigue   Behavior During Therapy [facility] for tasks assessed/performed      Past Medical History  Diagnosis Date  . PONV (postoperative nausea and vomiting)   . Hyperlipidemia     takes Crestor daily  . Hypertension     takes Amlodipine and Metoprolol  . Coronary artery disease   . 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 Joint pain     knees,elbows,back  . Diabetes mellitus     lantus and novolog;fasting sugars run 70-120  . CKD (chronic kidney disease)     Past Surgical History  Procedure Laterality Date  . Knee arthroscopy  80's    right knee  . Cholecystectomy  20+yrs ago  . Cardiac catheterization  01/2006    2 stents placed  . Back surgery  09/15/10  . Cataract extraction  12/21/10--02/01/11    right and left  . Elbow surgery  2012    right elbow  . Colonoscopy    . Ulnar nerve transposition  03/02/2011    Procedure: ULNAR NERVE DECOMPRESSION/TRANSPOSITION;  Surgeon: Peggyann Shoals, MD;  Location: [facility];  Service: Neurosurgery;  Laterality: Left;  LEFT ulnar nerve decompression  . Total hip arthroplasty      There were no vitals filed for this visit.  Visit Diagnosis:  Abnormality of gait  Hemiplegia affecting right  nondominant side (HCC)  Unsteadiness      Subjective Assessment - 04/19/15 1148    Subjective Pt denies significant changes since last session. No falls to report.   Patient is accompained by: Family member   Limitations Standing;Walking;House hold activities   Patient Stated Goals to get back to being independent   Currently in Pain? No/denies                         [facility] Adult PT Treatment/Exercise - 04/19/15 0001    Ambulation/Gait   Ambulation/Gait Yes   Ambulation/Gait Assistance 5: Supervision   Ambulation/Gait Assistance Details Cueing for consistent breathing during gait.   Ambulation Distance (Feet) 650 Feet   Assistive device None   Gait Pattern Step-through pattern;Trunk flexed;Trendelenburg;Lateral hip instability   Ambulation Surface Level;Unlevel;Indoor;Outdoor;Paved;Grass   Curb 5: Supervision;4: Min assist   Curb Details (indicate cue type and reason) Min A when stepping off curb (grass) onto pavement.   Dynamic Standing Balance   Dynamic Standing - Comments Obstacle course involving negotiating hurdles of varying height over compliant and solid surfaces x3 trials then x2 additional trials with seated rest break between trials; cueing emphasized hip/knee flexion while stepping over obstacles (due to frequent RLE circumduction).    Neuro Re-ed    Neuro Re-ed Details  Pt  performed the following on mat to simulate compliant/outdoor surfaces while carrying file folders to simulate activity at home: alternating toe taps on cones x6 while carrying files x2 trials with lateral LOB (both sides) x5; turning over cones 3 x5 reps then replacing cones into upright position x4 total prior to requiring rest break due to SOB. Cueing focused on consistent breathing, avoiding excessive lateral weight shift during single limb stance (which initially caused consistent LOB).                   PT Short Term Goals - 04/01/15 1251    PT SHORT TERM GOAL #1    Title Pt will initiate HEP for RLE strengthening and balance to indicate decreased fall risk and improved functional mobility.  (Target Date: 03/09/15)   Baseline Met 12/2.   Status Achieved   PT SHORT TERM GOAL #2   Title Pt will perform 6MWT and increase score by 150' in order to indicate functional improvement in endurance. (Target Date: 03/09/15)   Baseline 03/08/15: 1186 feet in 6 minutes with RW with supervision   Status Achieved   PT SHORT TERM GOAL #3   Title Pt will verbalize compliance with walking program to indicate improvement in functional endurance.  (Target Date: 03/09/15)   Baseline 03/08/15: walking at home with walker daily, up to 6 minutes on occasion, otherwise around 4 mintues. walking several times a day.    Status Achieved   PT SHORT TERM GOAL #4   Title Pt will increase gait speed to 2.31 ft/sec in order to indicate decreased fall risk and more efficient gait.  (Target Date: 03/09/15)   Baseline 12/22: gait velocity = 3.14 ft/sec with RW   Status Achieved   PT SHORT TERM GOAL #5   Title Pt will increase DGI to 13/24 in order to indicate decreased fall risk and functional improvemnet in balance. (Target Date: 03/09/15)   Baseline 03/08/15: scored 15/24 today with use of cane   Status Achieved   PT SHORT TERM GOAL #6   Title Pt will ambulate 300' on indoor surfaces with SPC at mod I level in order to indicate safe home negotiation.  (Target Date: 03/09/15)   Baseline met on 03/08/15   Status Achieved           PT Long Term Goals - 04/08/15 1323    PT LONG TERM GOAL #1   Title Pt will be independent with HEP in order to indicate improved RLE strength, balance and decreased fall risk. (Target Date: 04/06/15)   Baseline met 04/08/15   Status Achieved   PT LONG TERM GOAL #2   Title Pt will increase 6MWT distance by 150' in order to indicate improvement in functional endurance.  (Modified Target Date: 05/06/15)   Baseline 1138' without AD (was 1186' on 03/08/15 but  that was with RW)   Status Partially Met   PT LONG TERM GOAL #3   Title Pt will increase DGI to 20/24 in order to indicate decreased fall risk and functional improvement in balance. (Modified Target Date: 05/06/15)   Baseline 1/12: DGI score = 18/24   Status Partially Met   PT LONG TERM GOAL #4   Title Pt will verbalize return to community fitness/leisure activity to indicate return to prior level of function. (Modified Target Date: 05/06/15)   Status On-going   PT LONG TERM GOAL #5   Title Pt will ambulate >500' on outdoor surfaces (curb and ramp included) without AD at mod I  level in order to indicate safe community negotiation. Modified Date: 05/06/15)   Baseline S for >500' on paved surfaces 04/08/15   Status On-going   Additional Long Term Goals   Additional Long Term Goals Yes   PT LONG TERM GOAL #6   Title Pt will ambulate >200' without AD while carrying object up to 15lbs in order to indicate safe return to work duties within the home.  (Target Date: 05/06/15)               Plan - 04/19/15 1253    Clinical Impression Statement Session continued to focus on outdoor mobility, obstacle negotiation while carrying objects, and single limb stance stability. Pt continues to require frequent seated rest breaks due to fatigue, difficulty consistently breathing during activity. Also continue to note lateral LOB during single limb stance as well as R circumduction when stepping over obstacles.   Pt will benefit from skilled therapeutic intervention in order to improve on the following deficits Abnormal gait;Decreased activity tolerance;Decreased balance;Decreased coordination;Decreased endurance;Decreased mobility;Decreased strength;Impaired perceived functional ability;Impaired sensation;Impaired UE functional use;Postural dysfunction   Rehab Potential Excellent   PT Frequency 2x / week   PT Duration 8 weeks   PT Treatment/Interventions ADLs/Self Care Home Management;Electrical  Stimulation;DME Instruction;Gait training;Stair training;Functional mobility training;Therapeutic activities;Therapeutic exercise;Balance training;Neuromuscular re-education;Patient/family education;Orthotic Fit/Training;Visual/perceptual remediation/compensation   PT Next Visit Plan Continue gait without SPC on outdoor/compliant surfaces; carrying objects during mobility. Stepping over obstacles. Work towards [employer].     Consulted and Agree with Plan of Care Patient;Family member/caregiver   Family Member Consulted wife-[name]        Problem List Patient Active Problem List   Diagnosis Date Noted  . Thalamic hemorrhage ([place])   . ICH (intracerebral hemorrhage) ([place]) 02/01/2015  . Abdominal pain 01/25/2015  . Acute hepatitis   . Enteritis   . Ileus ([place])   . Uncontrollable vomiting    Billie Ruddy, PT, DPT [facility] [address] [place], [place], [address] Phone: [phone]   Fax:  [phone] 04/19/2015, 1:00 PM  Name: BRODRICK CURRAN MRN: [id number] Date of Birth: Jul 27, 1940

## 2015-04-21 ENCOUNTER — Encounter: Payer: Self-pay | Admitting: Rehabilitation

## 2015-04-21 ENCOUNTER — Ambulatory Visit: Payer: Medicare Other | Attending: Nurse Practitioner | Admitting: Rehabilitation

## 2015-04-21 DIAGNOSIS — R2681 Unsteadiness on feet: Secondary | ICD-10-CM | POA: Diagnosis present

## 2015-04-21 DIAGNOSIS — I6980 Unspecified sequelae of other cerebrovascular disease: Secondary | ICD-10-CM | POA: Diagnosis present

## 2015-04-21 DIAGNOSIS — R279 Unspecified lack of coordination: Secondary | ICD-10-CM | POA: Diagnosis present

## 2015-04-21 DIAGNOSIS — Z7409 Other reduced mobility: Secondary | ICD-10-CM

## 2015-04-21 DIAGNOSIS — G8193 Hemiplegia, unspecified affecting right nondominant side: Secondary | ICD-10-CM | POA: Insufficient documentation

## 2015-04-21 DIAGNOSIS — R4189 Other symptoms and signs involving cognitive functions and awareness: Secondary | ICD-10-CM | POA: Diagnosis present

## 2015-04-21 DIAGNOSIS — R201 Hypoesthesia of skin: Secondary | ICD-10-CM | POA: Diagnosis present

## 2015-04-21 DIAGNOSIS — IMO0002 Reserved for concepts with insufficient information to code with codable children: Secondary | ICD-10-CM

## 2015-04-21 DIAGNOSIS — R269 Unspecified abnormalities of gait and mobility: Secondary | ICD-10-CM | POA: Diagnosis not present

## 2015-04-21 NOTE — Therapy (Signed)
[facility] [address] [place], [place], [address] Phone: [phone]   Fax:  [phone]  Physical Therapy Treatment  Patient Details  Name: Todd [name] MRN: [id number] Date of Birth: [birth year] Referring Provider: Burnetta Sabin, NP  Encounter Date: 04/21/2015      PT End of Session - 04/21/15 1155    Visit Number 19   Number of Visits 25   Date for PT Re-Evaluation 05/08/15   Authorization Type [employer] MCR-Gcodes on every 10th visit.    PT Start Time 1147   PT Stop Time 1232   PT Time Calculation (min) 45 min   Equipment Utilized During Treatment Gait belt   Activity Tolerance Patient limited by fatigue;Patient tolerated treatment well  frequent seated rest breaks due to fatigue   Behavior During Therapy [facility] for tasks assessed/performed      Past Medical History  Diagnosis Date  . PONV (postoperative nausea and vomiting)   . Hyperlipidemia     takes Crestor daily  . Hypertension     takes Amlodipine and Metoprolol  . Coronary artery disease   . 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 Joint pain     knees,elbows,back  . Diabetes mellitus     lantus and novolog;fasting sugars run 70-120  . CKD (chronic kidney disease)     Past Surgical History  Procedure Laterality Date  . Knee arthroscopy  80's    right knee  . Cholecystectomy  20+yrs ago  . Cardiac catheterization  01/2006    2 stents placed  . Back surgery  09/15/10  . Cataract extraction  12/21/10--02/01/11    right and left  . Elbow surgery  2012    right elbow  . Colonoscopy    . Ulnar nerve transposition  03/02/2011    Procedure: ULNAR NERVE DECOMPRESSION/TRANSPOSITION;  Surgeon: Peggyann Shoals, MD;  Location: [facility];  Service: Neurosurgery;  Laterality: Left;  LEFT ulnar nerve decompression  . Total hip arthroplasty      There were no vitals filed for this visit.  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 No/denies                NMR:  Continue to work on high level balance with modified SLS activities to address RLE stability as well as coordination.  Performed standing in // bars with LLE on foam balance beam while RLE tapped to small object then to higher cone.  Cues for utilizing mirror to decreased forward flexed posture and improve accuracy of RLE movement.  Alternated to RLE stance on balance beam to increase weight shift, stability and WB on RLE while tapping LLE to targets.  Provided heavy tactile cues initially, however pt progressed with verbal and visual cues.  Progressed to stepping RLE forwards and backwards over cone, again with use of mirror for increased visual feedback for accuracy of RLE.  Initially note marked difficulty with RLE placement, therefore decreased height of target stepping over with marked improvement, esp when cued  to utilize mirror.  Again performed on LLE to allow RLE to be in stance for increased weight shift and WB to side.  Cues for breathing throughout.  Progressed to tandem walking x 30' forwards and backwards (with use of mirror).  Initially pt requires mod A to maintain balance and max cues to utilize mirror to visualize trunk movements.  Seated rest break followed by second performance of task with marked improvement, esp forwards and requires only min/guard to complete.  Ended session with quadruped activity alternating UE/LE to provide proximal stabilization, activation of core and improved coordination.  Performed 2 sets of 10 reps with tactile and  manual assist for improved R weight shift as this was when most difficulty was noted.                   PT Education - 04/21/15 1154    Education provided Yes   Education Details quadruped activity at home   Person(s) Educated Patient;Spouse   Methods Explanation   Comprehension Verbalized understanding          PT Short Term Goals - 04/01/15 1251    PT SHORT TERM GOAL #1   Title Pt will initiate HEP for RLE strengthening and balance to indicate decreased fall risk and improved functional mobility.  (Target Date: 03/09/15)   Baseline Met 12/2.   Status Achieved   PT SHORT TERM GOAL #2   Title Pt will perform 6MWT and increase score by 150' in order to indicate functional improvement in endurance. (Target Date: 03/09/15)   Baseline 03/08/15: 1186 feet in 6 minutes with RW with supervision   Status Achieved   PT SHORT TERM GOAL #3   Title Pt will verbalize compliance with walking program to indicate improvement in functional endurance.  (Target Date: 03/09/15)   Baseline 03/08/15: walking at home with walker daily, up to 6 minutes on occasion, otherwise around 4 mintues. walking several times a day.    Status Achieved   PT SHORT TERM GOAL #4   Title Pt will increase gait speed to 2.31 ft/sec in order to indicate decreased fall risk and more efficient gait.  (Target Date: 03/09/15)   Baseline 12/22: gait velocity = 3.14 ft/sec with RW   Status Achieved   PT SHORT TERM GOAL #5   Title Pt will increase DGI to 13/24 in order to indicate decreased fall risk and functional improvemnet in balance. (Target Date: 03/09/15)   Baseline 03/08/15: scored 15/24 today with use of cane   Status Achieved   PT SHORT TERM GOAL #6   Title Pt will ambulate 300' on indoor surfaces with SPC at mod I level in order to indicate safe home negotiation.  (Target Date: 03/09/15)   Baseline met on 03/08/15   Status Achieved           PT Long Term Goals - 04/08/15 1323    PT LONG TERM GOAL  #1   Title Pt will be independent with HEP in order to indicate improved RLE strength, balance and decreased fall risk. (Target Date: 04/06/15)   Baseline met 04/08/15   Status Achieved   PT LONG TERM GOAL #2   Title Pt will increase 6MWT distance by 150' in order to indicate improvement in functional endurance.  (Modified Target Date: 05/06/15)   Baseline 1138' without AD (was 1186' on 03/08/15 but that was with RW)   Status Partially Met   PT LONG TERM GOAL #3   Title Pt will increase DGI  to 20/24 in order to indicate decreased fall risk and functional improvement in balance. (Modified Target Date: 05/06/15)   Baseline 1/12: DGI score = 18/24   Status Partially Met   PT LONG TERM GOAL #4   Title Pt will verbalize return to community fitness/leisure activity to indicate return to prior level of function. (Modified Target Date: 05/06/15)   Status On-going   PT LONG TERM GOAL #5   Title Pt will ambulate >500' on outdoor surfaces (curb and ramp included) without AD at mod I level in order to indicate safe community negotiation. Modified Date: 05/06/15)   Baseline S for >500' on paved surfaces 04/08/15   Status On-going   Additional Long Term Goals   Additional Long Term Goals Yes   PT LONG TERM GOAL #6   Title Pt will ambulate >200' without AD while carrying object up to 15lbs in order to indicate safe return to work duties within the home.  (Target Date: 05/06/15)               Plan - 04/21/15 1155    Clinical Impression Statement Skilled session focused on high level balance and NMR for RLE and postural control with modified SLS exericses.  Tolerated well, but continues to demonstrate decreased weight shift to the R, therefore provided mirror for increased visual feedback during session with marked improvement.    Pt will benefit from skilled therapeutic intervention in order to improve on the following deficits Abnormal gait;Decreased activity tolerance;Decreased balance;Decreased  coordination;Decreased endurance;Decreased mobility;Decreased strength;Impaired perceived functional ability;Impaired sensation;Impaired UE functional use;Postural dysfunction   Rehab Potential Excellent   PT Frequency 2x / week   PT Duration 8 weeks   PT Treatment/Interventions ADLs/Self Care Home Management;Electrical Stimulation;DME Instruction;Gait training;Stair training;Functional mobility training;Therapeutic activities;Therapeutic exercise;Balance training;Neuromuscular re-education;Patient/family education;Orthotic Fit/Training;Visual/perceptual remediation/compensation   PT Next Visit Plan Continue gait without SPC on outdoor/compliant surfaces; carrying objects during mobility. Stepping over obstacles. Work towards [employer].     Consulted and Agree with Plan of Care Patient;Family member/caregiver   Family Member Consulted wife-[name]        Problem List Patient Active Problem List   Diagnosis Date Noted  . Thalamic hemorrhage ([place])   . ICH (intracerebral hemorrhage) ([place]) 02/01/2015  . Abdominal pain 01/25/2015  . Acute hepatitis   . Enteritis   . Ileus ([place])   . Uncontrollable vomiting     Cameron Sprang, PT, MPT [facility] [address] [place], [place], [address] Phone: [phone]   Fax:  [phone] 04/21/2015, 1:02 PM  Name: Todd [name] MRN: [id number] Date of Birth: [birth year]

## 2015-04-26 ENCOUNTER — Ambulatory Visit: Payer: Medicare Other | Admitting: Rehabilitation

## 2015-04-26 ENCOUNTER — Encounter: Payer: Self-pay | Admitting: Rehabilitation

## 2015-04-26 DIAGNOSIS — R269 Unspecified abnormalities of gait and mobility: Secondary | ICD-10-CM | POA: Diagnosis not present

## 2015-04-26 DIAGNOSIS — R2681 Unsteadiness on feet: Secondary | ICD-10-CM

## 2015-04-26 DIAGNOSIS — R4189 Other symptoms and signs involving cognitive functions and awareness: Secondary | ICD-10-CM

## 2015-04-26 DIAGNOSIS — R449 Unspecified symptoms and signs involving general sensations and perceptions: Secondary | ICD-10-CM

## 2015-04-26 DIAGNOSIS — G8193 Hemiplegia, unspecified affecting right nondominant side: Secondary | ICD-10-CM

## 2015-04-26 NOTE — Therapy (Signed)
[facility] [address] [place], [place], [address] Phone: [phone]   Fax:  [phone]  Physical Therapy Treatment  Patient Details  Name: Todd [name] MRN: [id number] Date of Birth: 1940-04-09 Referring Provider: Burnetta Sabin, NP  Encounter Date: 04/26/2015      PT End of Session - 04/26/15 1154    Visit Number 20   Number of Visits 25   Date for PT Re-Evaluation 05/08/15   Authorization Type U[employer] MCR-Gcodes on every 10th visit.    PT Start Time 1150   PT Stop Time 1235   PT Time Calculation (min) 45 min   Equipment Utilized During Treatment Gait belt   Activity Tolerance Patient limited by fatigue;Patient tolerated treatment well  frequent seated rest breaks due to fatigue   Behavior During Therapy [facility] for tasks assessed/performed      Past Medical History  Diagnosis Date  . PONV (postoperative nausea and vomiting)   . Hyperlipidemia     takes Crestor daily  . Hypertension     takes Amlodipine and Metoprolol  . Coronary artery disease   . 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 Joint pain     knees,elbows,back  . Diabetes mellitus     lantus and novolog;fasting sugars run 70-120  . CKD (chronic kidney disease)     Past Surgical History  Procedure Laterality Date  . Knee arthroscopy  80's    right knee  . Cholecystectomy  20+yrs ago  . Cardiac catheterization  01/2006    2 stents placed  . Back surgery  09/15/10  . Cataract extraction  12/21/10--02/01/11    right and left  . Elbow surgery  2012    right elbow  . Colonoscopy    . Ulnar nerve transposition  03/02/2011    Procedure: ULNAR NERVE DECOMPRESSION/TRANSPOSITION;  Surgeon: Peggyann Shoals, MD;  Location: [facility];  Service: Neurosurgery;  Laterality: Left;  LEFT ulnar nerve decompression  . Total hip arthroplasty      There were no vitals filed for this visit.  Visit Diagnosis:  Abnormality of gait  Unsteadiness  Hemiplegia  affecting right nondominant side (HCC)  Sensory deficit, right      Subjective Assessment - 04/26/15 1153    Subjective No chagnes since last visit, no falls.   Limitations Standing;Walking;House hold activities   Patient Stated Goals to get back to being independent   Currently in Pain? No/denies           NMR:  Performed DGI for progress note assessment.  Note score below.  He has not progressed score since 04/01/15 and remains at 18/24.  Discussed results with pt and wife during session.  Also assessed counter top balance exercises that pt is performing at home.  Tandem walking forwards and backwards along counter top x 4 reps with cues for increased attention to distance from counter and pressure on L hand as to cue pt for increasing weight shift to the R.  Max cues for slower speed when performing exercise to increase balance challenge.  Also performed marching with single UE support x 20 reps with cues for slower speed and only single UE support as he was facing counter at home.  Next provided pt with corner HEP for balance and vestibular challenge.  See pt instruction for full details on reps and exercises performed.    Self Care:  Initiated discussion regarding possible DC after next 4 visits as pt is beginning  to plateau with progress.  Educated that pt could take break from therapy and work at home on exercises and balance and return if feel he is beginning to make progress again.  Wife and pt not thrilled on idea of D/C, however educated that insurance will not cover if pt not making progress.  Both verbalized understanding.               [facility] Adult PT Treatment/Exercise - 04/26/15 1201    Standardized Balance Assessment   Standardized Balance Assessment Dynamic Gait Index   Dynamic Gait Index   Level Surface Mild Impairment   Change in Gait Speed Normal   Gait with Horizontal Head Turns Moderate Impairment   Gait with Vertical Head Turns Mild Impairment   Gait and  Pivot Turn Normal   Step Over Obstacle Mild Impairment   Step Around Obstacles Normal   Steps Mild Impairment   Total Score 18                PT Education - 04/26/15 1154    Education provided Yes   Education Details HEP, initiation of discussion on D/C    Person(s) Educated Patient   Methods Explanation   Comprehension Verbalized understanding          PT Short Term Goals - 04/01/15 1251    PT SHORT TERM GOAL #1   Title Pt will initiate HEP for RLE strengthening and balance to indicate decreased fall risk and improved functional mobility.  (Target Date: 03/09/15)   Baseline Met 12/2.   Status Achieved   PT SHORT TERM GOAL #2   Title Pt will perform 6MWT and increase score by 150' in order to indicate functional improvement in endurance. (Target Date: 03/09/15)   Baseline 03/08/15: 1186 feet in 6 minutes with RW with supervision   Status Achieved   PT SHORT TERM GOAL #3   Title Pt will verbalize compliance with walking program to indicate improvement in functional endurance.  (Target Date: 03/09/15)   Baseline 03/08/15: walking at home with walker daily, up to 6 minutes on occasion, otherwise around 4 mintues. walking several times a day.    Status Achieved   PT SHORT TERM GOAL #4   Title Pt will increase gait speed to 2.31 ft/sec in order to indicate decreased fall risk and more efficient gait.  (Target Date: 03/09/15)   Baseline 12/22: gait velocity = 3.14 ft/sec with RW   Status Achieved   PT SHORT TERM GOAL #5   Title Pt will increase DGI to 13/24 in order to indicate decreased fall risk and functional improvemnet in balance. (Target Date: 03/09/15)   Baseline 03/08/15: scored 15/24 today with use of cane   Status Achieved   PT SHORT TERM GOAL #6   Title Pt will ambulate 300' on indoor surfaces with SPC at mod I level in order to indicate safe home negotiation.  (Target Date: 03/09/15)   Baseline met on 03/08/15   Status Achieved           PT Long Term  Goals - 04/08/15 1323    PT LONG TERM GOAL #1   Title Pt will be independent with HEP in order to indicate improved RLE strength, balance and decreased fall risk. (Target Date: 04/06/15)   Baseline met 04/08/15   Status Achieved   PT LONG TERM GOAL #2   Title Pt will increase 6MWT distance by 150' in order to indicate improvement in functional endurance.  (Modified Target Date: 05/06/15)  Baseline 1138' without AD (was 1186' on 03/08/15 but that was with RW)   Status Partially Met   PT LONG TERM GOAL #3   Title Pt will increase DGI to 20/24 in order to indicate decreased fall risk and functional improvement in balance. (Modified Target Date: 05/06/15)   Baseline 1/12: DGI score = 18/24   Status Partially Met   PT LONG TERM GOAL #4   Title Pt will verbalize return to community fitness/leisure activity to indicate return to prior level of function. (Modified Target Date: 05/06/15)   Status On-going   PT LONG TERM GOAL #5   Title Pt will ambulate >500' on outdoor surfaces (curb and ramp included) without AD at mod I level in order to indicate safe community negotiation. Modified Date: 05/06/15)   Baseline S for >500' on paved surfaces 04/08/15   Status On-going   Additional Long Term Goals   Additional Long Term Goals Yes   PT LONG TERM GOAL #6   Title Pt will ambulate >200' without AD while carrying object up to 15lbs in order to indicate safe return to work duties within the home.  (Target Date: 05/06/15)               Plan - 2015/04/29 1155    Clinical Impression Statement Skilled session focused on assessment of balance with DGI as progress note due today.  Note that pt is still scoring 18/24 which was same score from 04/01/15.  Discussed results with pt and wife and the fact that he is starting to plateau in therapy.  We will likely be [place] after these five visits due to this.  Wife very concerned as his balance is still impaired.  Educated that pt could begin to work on exercises and  gait at home for some time and then return to therapy to assess further gains.  Provided new balance HEP during session and re-assessed countertop exercises.     Pt will benefit from skilled therapeutic intervention in order to improve on the following deficits Abnormal gait;Decreased activity tolerance;Decreased balance;Decreased coordination;Decreased endurance;Decreased mobility;Decreased strength;Impaired perceived functional ability;Impaired sensation;Impaired UE functional use;Postural dysfunction   Rehab Potential Excellent   PT Frequency 2x / week   PT Duration 8 weeks   PT Treatment/Interventions ADLs/Self Care Home Management;Electrical Stimulation;DME Instruction;Gait training;Stair training;Functional mobility training;Therapeutic activities;Therapeutic exercise;Balance training;Neuromuscular re-education;Patient/family education;Orthotic Fit/Training;Visual/perceptual remediation/compensation   PT Next Visit Plan Continue to have discussion regarding possible D/C following 5 visits, check balance exercises from last session (compliant?) Add/modifiy as needed.    Consulted and Agree with Plan of Care Patient;Family member/caregiver   Family Member Consulted wife-[name]          G-Codes - 04-29-2015 1255    Functional Assessment Tool Used DGI: 18/24    Functional Limitation Mobility: Walking and moving around   Mobility: Walking and Moving Around Current Status [phone]) At least 40 percent but less than 60 percent impaired, limited or restricted   Mobility: Walking and Moving Around Goal Status [phone]) At least 1 percent but less than 20 percent impaired, limited or restricted      Physical Therapy Progress Note  Dates of Reporting Period: 03/19/15 to 04/29/2015  Objective Reports of Subjective Statement: See subjective above  Objective Measurements: DGI 18/24   Goal Update: See goals above  Plan: Continue current POC.   Reason Skilled Services are Required: Pt continues to have  balance deficits in which he demonstrates decreased ability to weight shift to the R and vestibular deficits.  Will continue to address this in therapy.      Problem List Patient Active Problem List   Diagnosis Date Noted  . Thalamic hemorrhage ([place])   . ICH (intracerebral hemorrhage) ([place]) 02/01/2015  . Abdominal pain 01/25/2015  . Acute hepatitis   . Enteritis   . Ileus ([place])   . Uncontrollable vomiting     Cameron Sprang, PT, MPT [facility] [address] [place], [place], [address] Phone: [phone]   Fax:  [phone] 04/26/2015, 12:57 PM  Name: Todd [name] MRN: [id number] Date of Birth: 1941-01-26

## 2015-04-26 NOTE — Patient Instructions (Signed)
SINGLE LIMB STANCE    Stand in corner with a chair in front of you (get wall mirror if you can so that you can see yourself. Raise your left leg. Hold _15__ seconds. _3 reps per set, _2__ sets per day, _7__ days per week  Copyright  VHI. All rights reserved.   Feet Together, Arm Motion - Eyes Closed    Stand in corner with chair in front of you for support.  With eyes closed and feet together, KEEP ARMS BY YOUR SIDE.  Hold for 30 seconds.  Repeat __3__ times per session. Do __1-2__ sessions per day.  Copyright  VHI. All rights reserved.   Feet Apart (Compliant Surface) Arm Motion - Eyes Open    Stand in corner with chair in front of you for support.  With eyes open, standing on compliant surface: __stacked pillows or cushion ______, feet shoulder width apart, KEEP ARMS BY YOUR SIDE.  Hold for 30 seconds.  If this gets easy, progress to eyes closed x 3 reps of 30 seconds.  Repeat __3__ times per session. Do __1-2__ sessions per day.  Copyright  VHI. All rights reserved.   Feet Together (Compliant Surface) Arm Motion - Eyes Open    Stand in corner with chair in front of you for support.  With eyes open, standing on compliant surface:__stacked pillows or cushion______, feet together, KEEP ARMS BY YOUR SIDE.  Hold for 30 seconds Repeat _3___ times per session. Do _1-2___ sessions per day.  Copyright  VHI. All rights reserved.

## 2015-04-29 ENCOUNTER — Ambulatory Visit: Payer: Medicare Other | Admitting: Physical Therapy

## 2015-04-29 ENCOUNTER — Encounter: Payer: Self-pay | Admitting: Neurology

## 2015-04-29 ENCOUNTER — Ambulatory Visit (INDEPENDENT_AMBULATORY_CARE_PROVIDER_SITE_OTHER): Payer: Medicare Other | Admitting: Neurology

## 2015-04-29 VITALS — BP 167/71 | HR 56 | Ht 69.0 in | Wt 203.6 lb

## 2015-04-29 DIAGNOSIS — I610 Nontraumatic intracerebral hemorrhage in hemisphere, subcortical: Secondary | ICD-10-CM

## 2015-04-29 DIAGNOSIS — R2681 Unsteadiness on feet: Secondary | ICD-10-CM

## 2015-04-29 DIAGNOSIS — E785 Hyperlipidemia, unspecified: Secondary | ICD-10-CM | POA: Diagnosis not present

## 2015-04-29 DIAGNOSIS — I10 Essential (primary) hypertension: Secondary | ICD-10-CM

## 2015-04-29 DIAGNOSIS — R269 Unspecified abnormalities of gait and mobility: Secondary | ICD-10-CM | POA: Diagnosis not present

## 2015-04-29 DIAGNOSIS — E1149 Type 2 diabetes mellitus with other diabetic neurological complication: Secondary | ICD-10-CM

## 2015-04-29 DIAGNOSIS — G8193 Hemiplegia, unspecified affecting right nondominant side: Secondary | ICD-10-CM

## 2015-04-29 NOTE — Therapy (Signed)
[facility] [address] [place], [place], [address] Phone: [phone]   Fax:  [phone]  Physical Therapy Treatment  Patient Details  Name: Todd [name] MRN: [id number] Date of Birth: 09/17/1940 Referring Provider: Burnetta Sabin, NP  Encounter Date: 04/29/2015      PT End of Session - 04/29/15 1242    Visit Number 21   Number of Visits 25   Date for PT Re-Evaluation 05/08/15   Authorization Type UHC MCR-Gcodes on every 10th visit.    PT Start Time 1020   PT Stop Time 1113   PT Time Calculation (min) 53 min   Equipment Utilized During Treatment Gait belt   Activity Tolerance Patient limited by fatigue;Patient tolerated treatment well  Continues to require frequent rest breaks due to fatigue   Behavior During Therapy [facility] for tasks assessed/performed      Past Medical History  Diagnosis Date  . PONV (postoperative nausea and vomiting)   . Hyperlipidemia     takes Crestor daily  . Hypertension     takes Amlodipine and Metoprolol  . Coronary artery disease   . 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 Joint pain     knees,elbows,back  . Diabetes mellitus     lantus and novolog;fasting sugars run 70-120  . CKD (chronic kidney disease)     Past Surgical History  Procedure Laterality Date  . Knee arthroscopy  80's    right knee  . Cholecystectomy  20+yrs ago  . Cardiac catheterization  01/2006    2 stents placed  . Back surgery  09/15/10  . Cataract extraction  12/21/10--02/01/11    right and left  . Elbow surgery  2012    right elbow  . Colonoscopy    . Ulnar nerve transposition  03/02/2011    Procedure: ULNAR NERVE DECOMPRESSION/TRANSPOSITION;  Surgeon: Peggyann Shoals, MD;  Location: [facility];  Service: Neurosurgery;  Laterality: Left;  LEFT ulnar nerve decompression  . Total hip arthroplasty      There were no vitals filed for this visit.  Visit Diagnosis:  Abnormality of  gait  Unsteadiness  Hemiplegia affecting right nondominant side [facility])      Subjective Assessment - 04/29/15 1203    Subjective Per wife, "Giorgi saw the neurologist, Dr. Erlinda Hong, today. The doctor couldn't believe that the patient hasn't been driving or working this whole time. She said it's fine for him to drive and work." Wife expresses feeling as though pt needs to continue PT.  Pt reports compliance with HEP "sometimes".  Pt inquiring as to whether he may start walking over grass without  device.   Patient is accompained by: Family member   Limitations Standing;Walking;House hold activities   Patient Stated Goals to get back to being independent   Currently in Pain? No/denies                         [facility] Adult PT Treatment/Exercise - 04/29/15 0001    Ambulation/Gait   Ambulation/Gait Yes   Ambulation/Gait Assistance 6: Modified independent (Device/Increase time);5: Supervision;4: Min guard;4: Min assist   Ambulation/Gait Assistance Details Gait [phone]' level, indoor surfaces with no AD and mod I, increased time. Gait x500' over unlevel, pved surfaces with supervision, mild LOB to L side during 90-degree turn. Per pt inquiry about ambulating over grass, performed gait x175' over unlevel, grass surfaces without AD with min guard to min A to recover from  LOB to L side, causing RLE scissoring.    Ambulation Distance (Feet) 990 Feet  total   Assistive device None   Gait Pattern Step-through pattern;Trunk flexed;Trendelenburg;Lateral hip instability;Decreased weight shift to right   Ambulation Surface Level;Unlevel;Indoor;Outdoor;Paved;Grass   Gait Comments After NMR for control of lateral weight shift to L side during turning, performed gait x200' with frequent 90 and 180-degree turns with verbal/tactile cueing for full lateral weight shift to R side, consistent L hip/knee flexion during LLE advancement.   Self-Care   Self-Care Other Self-Care Comments   Other Self-Care  Comments  Due to wife expressing feeling as though pt needs continued PT, engaged in discussion with pt/wife about recent decrease in progress with functional mobility. Also discussed wife having told PT's that patient's walking is better than it was prior to CVA. Pt wishes to return to cardiac rehab and this PT in agreement that pt would benefit from cardiac rehab due to significant limitation in CV endurance, activity tolerance. Recommending to D/C after session on 2/15, as originally planned. Discussed that it would be appropriate for pt to return to outpatient neuro PT if noted change in functional status occurs, such as a decline or an improvement after cardiac rehab. Pt/wife verbalized understanding of recommendations.   Neuro Re-ed    Neuro Re-ed Details  Noted pt tendency to lose balance to L side when turning during gait due to limited control of lateral weight shift to L during RLE advancement. Therefore, performed 90-degree turns x6 then x7 (rest breaks between trials) in tall kneeling to increase proprioceptive input, postural control during turning.                PT Education - 04/29/15 1205    Education provided Yes   Education Details Reiterated that PT's defer to physician to determine when pt safe to return to driving. Discussed POC, plan for D/C after next 2 visits due to wife report of patient walking better than prior to CVA. Recommended pt not ambulate over grass without SPC at this time.   Person(s) Educated Patient;Spouse   Methods Explanation   Comprehension Verbalized understanding          PT Short Term Goals - 04/01/15 1251    PT SHORT TERM GOAL #1   Title Pt will initiate HEP for RLE strengthening and balance to indicate decreased fall risk and improved functional mobility.  (Target Date: 03/09/15)   Baseline Met 12/2.   Status Achieved   PT SHORT TERM GOAL #2   Title Pt will perform 6MWT and increase score by 150' in order to indicate functional  improvement in endurance. (Target Date: 03/09/15)   Baseline 03/08/15: 1186 feet in 6 minutes with RW with supervision   Status Achieved   PT SHORT TERM GOAL #3   Title Pt will verbalize compliance with walking program to indicate improvement in functional endurance.  (Target Date: 03/09/15)   Baseline 03/08/15: walking at home with walker daily, up to 6 minutes on occasion, otherwise around 4 mintues. walking several times a day.    Status Achieved   PT SHORT TERM GOAL #4   Title Pt will increase gait speed to 2.31 ft/sec in order to indicate decreased fall risk and more efficient gait.  (Target Date: 03/09/15)   Baseline 12/22: gait velocity = 3.14 ft/sec with RW   Status Achieved   PT SHORT TERM GOAL #5   Title Pt will increase DGI to 13/24 in order to indicate decreased fall risk and  functional improvemnet in balance. (Target Date: 03/09/15)   Baseline 03/08/15: scored 15/24 today with use of cane   Status Achieved   PT SHORT TERM GOAL #6   Title Pt will ambulate 300' on indoor surfaces with SPC at mod I level in order to indicate safe home negotiation.  (Target Date: 03/09/15)   Baseline met on 03/08/15   Status Achieved           PT Long Term Goals - 04/08/15 1323    PT LONG TERM GOAL #1   Title Pt will be independent with HEP in order to indicate improved RLE strength, balance and decreased fall risk. (Target Date: 04/06/15)   Baseline met 04/08/15   Status Achieved   PT LONG TERM GOAL #2   Title Pt will increase 6MWT distance by 150' in order to indicate improvement in functional endurance.  (Modified Target Date: 05/06/15)   Baseline 1138' without AD (was 1186' on 03/08/15 but that was with RW)   Status Partially Met   PT LONG TERM GOAL #3   Title Pt will increase DGI to 20/24 in order to indicate decreased fall risk and functional improvement in balance. (Modified Target Date: 05/06/15)   Baseline 1/12: DGI score = 18/24   Status Partially Met   PT LONG TERM GOAL #4    Title Pt will verbalize return to community fitness/leisure activity to indicate return to prior level of function. (Modified Target Date: 05/06/15)   Status On-going   PT LONG TERM GOAL #5   Title Pt will ambulate >500' on outdoor surfaces (curb and ramp included) without AD at mod I level in order to indicate safe community negotiation. Modified Date: 05/06/15)   Baseline S for >500' on paved surfaces 04/08/15   Status On-going   Additional Long Term Goals   Additional Long Term Goals Yes   PT LONG TERM GOAL #6   Title Pt will ambulate >200' without AD while carrying object up to 15lbs in order to indicate safe return to work duties within the home.  (Target Date: 05/06/15)               Plan - 04/29/15 1244    Clinical Impression Statement Session focused on increasing gait stability without AD with emphasis on controlling lateral weight shift to L side during turning. Discussed with pt/wife that PT still planning to DC after session on 2/15.   Pt will benefit from skilled therapeutic intervention in order to improve on the following deficits Abnormal gait;Decreased activity tolerance;Decreased balance;Decreased coordination;Decreased endurance;Decreased mobility;Decreased strength;Impaired perceived functional ability;Impaired sensation;Impaired UE functional use;Postural dysfunction   Rehab Potential Excellent   PT Frequency 2x / week   PT Duration 8 weeks   PT Treatment/Interventions ADLs/Self Care Home Management;Electrical Stimulation;DME Instruction;Gait training;Stair training;Functional mobility training;Therapeutic activities;Therapeutic exercise;Balance training;Neuromuscular re-education;Patient/family education;Orthotic Fit/Training;Visual/perceptual remediation/compensation   PT Next Visit Plan Check balance exercises from last session (compliant?).  Add/modifiy as needed. Continue discussion about rationale for DC as questions arise.   Consulted and Agree with Plan of Care  Patient;Family member/caregiver   Family Member Consulted wife-[name]        Problem List Patient Active Problem List   Diagnosis Date Noted  . Thalamic hemorrhage ([place])   . ICH (intracerebral hemorrhage) ([place]) 02/01/2015  . Abdominal pain 01/25/2015  . Acute hepatitis   . Enteritis   . Ileus ([place])   . Uncontrollable vomiting    Billie Ruddy, PT, DPT [facility] [place]  [place], [place], [address] Phone: [phone]   Fax:  [phone] 04/29/2015, 12:48 PM   Name: Todd [name] MRN: [id number] Date of Birth: [birth year]

## 2015-04-29 NOTE — Progress Notes (Signed)
STROKE NEUROLOGY FOLLOW UP NOTE  NAME: Todd [name] DOB: [birth year]  REASON FOR VISIT: stroke follow up HISTORY FROM: pt and chart  Today we had the pleasure of seeing Todd [name] in follow-up at our Neurology Clinic. Pt was accompanied by wife.   History Summary Todd [name] is a 75 y.o. male w/ PMHx of HTN, HLD, DM type II, CAD, and CKD stage 3, was admitted on 02/01/15 for right-sided sensory deficits. 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 Other stroke work up including CUS, TTE, LDL and A1C all unremarkable. His symptom getting better and he was discharged in good condition with resume ASA 81mg  in 2 weeks.   Interval History During the interval time, the patient has been doing well. No recurrent events. Has resumed ASA 81mg . BP stable at home as per pt, but in clinic today 167/71. Glucose at home 100-140, on lantus, recent A1C 7.4. Has followed up PCP early last month.     REVIEW OF SYSTEMS: Full 14 system review of systems performed and notable only for those listed below and in HPI above, all others are negative:  Constitutional:   Cardiovascular: Swelling in legs Ear/Nose/Throat:   Skin:  Eyes:   Respiratory:   Gastroitestinal:   Genitourinary:  Hematology/Lymphatic:   Endocrine:  Musculoskeletal:   Allergy/Immunology:   Neurological:   numbness, weakness  Psychiatric:  Sleep:   The following represents the patient's updated allergies and side effects list: Allergies  Allergen Reactions  . Centrum Other (See Comments)    Bloating, indigestion, chest pain  . Valsartan Other (See Comments)    Unsure of symptoms, was on list from previous hospital and patient was unsure of name of medicine.  . Vitamin D Analogs Other (See Comments)    Bloating and swelling in abdomen region, indigestion. Prescription vit d not otc    The neurologically relevant items on the patient's problem list were  reviewed on today's visit.  Neurologic Examination  A problem focused neurological exam (12 or more points of the single system neurologic examination, vital signs counts as 1 point, cranial nerves count for 8 points) was performed.  Blood pressure 167/71, pulse 56, height 5\' 9"  (1.753 m), weight 203 lb 9.6 oz (92.352 kg).  General - Well nourished, well developed, in no apparent distress.  Ophthalmologic - Fundi not visualized due to noncooperation.  Cardiovascular - Regular rate and rhythm.  Mental Status -  Level of arousal and orientation to time, place, and person were intact. Language including expression, naming, repetition, comprehension was assessed and found intact. Fund of Knowledge was assessed and was intact.  Cranial Nerves II - XII - II - Visual field intact OU. III, IV, VI - Extraocular movements intact. V - Facial sensation intact bilaterally. VII - Facial movement intact bilaterally. VIII - Hearing & vestibular intact bilaterally. X - Palate elevates symmetrically. XI - Chin turning & shoulder shrug intact bilaterally. XII - Tongue protrusion intact.  Motor Strength - The patient's strength was normal in all extremities except LLE 5-/5 due to left hip discomfort and pronator drift was absent.  Bulk was normal and fasciculations were absent.   Motor Tone - Muscle tone was assessed at the neck and appendages and was normal.  Reflexes - The patient's reflexes were 1+ in all extremities and he had no pathological reflexes.  Sensory - Light touch, temperature/pinprick were assessed and were decreased on  the right, 75% of left.    Coordination - The patient had normal movements in the hands and feet with no ataxia or dysmetria.  Tremor was absent.  Gait and Station - subtle left antalgic gait due to left hip discomfort.  Data reviewed: I personally reviewed the images and agree with the radiology interpretations.  Ct Abdomen Pelvis Wo Contrast 01/24/2015 1.  Small volume of fluid adjacent to some loops of bowel in the right side of the abdomen (distal ileum). This is nonspecific, but could indicate a focus of enteritis, with some reactive free fluid adjacent to inflamed loops of small bowel. 2. Small ventral hernia in the epigastric region containing only omental fat. No associated bowel incarceration or obstruction at this time. 3. 7 mm left lower lobe pulmonary nodule.   Ct Head Wo Contrast  02/01/2015 Small, acute appearing hemorrhage in the left thalamus is likely hypertensive in nature. Atrophy and chronic microvascular ischemic change.   2D Echocardiogram  - Left ventricle: The cavity size was normal. Wall thickness wasincreased in a pattern of mild LVH. Systolic function was normal.The estimated ejection fraction was in the range of 55% to 60%Wall motion was normal; there were no regional wall motionabnormalities. Doppler parameters are consistent with abnormalleft ventricular relaxation (grade 1 diastolic dysfunction).Doppler parameters are consistent with high ventricular fillingpressure. - Mitral valve: Calcified annulus. Mobility was not restricted.Transvalvular velocity was within the normal range. There was noevidence for stenosis. There was mild regurgitation. - Right ventricle: The cavity size was normal. Wall thickness wasnormal. Systolic function was normal. - Atrial septum: No defect or patent foramen ovale was identified. - Tricuspid valve: There was mild regurgitation. - Pulmonary arteries: Systolic pressure was mildly increased. PApeak pressure: 34 mm Hg (S). - Inferior vena cava: The vessel was normal in size. Therespirophasic diameter changes were in the normal range (>= 50%),consistent with normal central venous pressure.  Carotid Doppler  Bilateral - 1% to 39% ICA stenosis. ECA stenosis. Vertebral artery flow is antegrade.  Component     Latest Ref Rng 01/25/2015 02/02/2015  Cholesterol     0 - 200 mg/dL   109  Triglycerides     <150 mg/dL  181 (H)  HDL Cholesterol     >40 mg/dL  31 (L)  Total CHOL/HDL Ratio       3.5  VLDL     0 - 40 mg/dL  36  LDL (calc)     0 - 99 mg/dL  42  TSH     0.350 - 4.500 uIU/mL 6.162 (H)   Hemoglobin A1C     4.0 - 6.0 % 6.1 (H)     Assessment: As you may recall, he is a 75 y.o. 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 Other stroke work up including CUS, TTE, LDL and A1C all unremarkable. His symptom getting better and he was discharged in good condition with resume ASA 81mg  in 2 weeks. During the interval time, the patient has been doing well, stated BP in control at home.  Plan:  - continue ASA and crestor for stroke prevention - check BP at home and record and bring over to PCP - check glucose at home and follow up with endocrinology - Follow up with your primary care physician for stroke risk factor modification. Recommend maintain blood pressure goal <130/80, diabetes  with hemoglobin A1c goal below 6.5% and lipids with LDL cholesterol goal below 70 mg/dL.  - consider sleep study in the future - healthy diet and home exercise and continue PT - follow up in 3 months.   I spent more than 25 minutes of face to face time with the patient. Greater than 50% of time was spent in counseling and coordination of care. We discussed about BP monitoring, continue PT/OT.    No orders of the defined types were placed in this encounter.    Meds ordered this encounter  Medications  . sodium polystyrene (KAYEXALATE) 15 GM/60ML suspension    Sig: Take by mouth. Takes it weekly  . amLODipine (NORVASC) 10 MG tablet    Sig:   . ONE TOUCH ULTRA TEST test strip    Sig:   . glucose blood (ONE TOUCH ULTRA TEST) test strip    Sig:   . DISCONTD: sodium bicarbonate 650 MG tablet    Sig: Take 650 mg by mouth. Reported on  04/29/2015  . rosuvastatin (CRESTOR) 20 MG tablet    Sig:     Patient Instructions  - continue ASA and crestor for stroke prevention - check BP at home and record and bring over to PCP - check glucose at home and follow up with endocrinology - Follow up with your primary care physician for stroke risk factor modification. Recommend maintain blood pressure goal <130/80, diabetes with hemoglobin A1c goal below 6.5% and lipids with LDL cholesterol goal below 70 mg/dL.  - consider sleep study in the future - healthy diet and home exercise and continue PT - No restriction for driving, but recommend to drive with family members on board for 2-3 times initially. If both parties feel comfortable of your driving, you can drive alone after. However, you are recommended to drive during the day not at night, no long distance and drive in familiar roads. - OK to go back to work full time, but recommend gradually increase the workload. - OK to do card[facility] rehab from [facility], but also check with physical therapy. - OK to back to golf course. - follow up in 3 months.     Rosalin Hawking, MD PhD [facility] [address] [place], [place] [address] [phone]

## 2015-04-29 NOTE — Patient Instructions (Addendum)
-   continue ASA and crestor for stroke prevention - check BP at home and record and bring over to PCP - check glucose at home and follow up with endocrinology - Follow up with your primary care physician for stroke risk factor modification. Recommend maintain blood pressure goal <130/80, diabetes with hemoglobin A1c goal below 6.5% and lipids with LDL cholesterol goal below 70 mg/dL.  - consider sleep study in the future - healthy diet and home exercise and continue PT - No restriction for driving, but recommend to drive with family members on board for 2-3 times initially. If both parties feel comfortable of your driving, you can drive alone after. However, you are recommended to drive during the day not at night, no long distance and drive in familiar roads. - OK to go back to work full time, but recommend gradually increase the workload. - OK to do cardo rehab from [facility], but also check with physical therapy. - OK to back to golf course. - follow up in 3 months.

## 2015-05-03 ENCOUNTER — Ambulatory Visit: Payer: Medicare Other | Admitting: Rehabilitation

## 2015-05-03 ENCOUNTER — Encounter: Payer: Self-pay | Admitting: Rehabilitation

## 2015-05-03 DIAGNOSIS — IMO0002 Reserved for concepts with insufficient information to code with codable children: Secondary | ICD-10-CM

## 2015-05-03 DIAGNOSIS — R269 Unspecified abnormalities of gait and mobility: Secondary | ICD-10-CM

## 2015-05-03 DIAGNOSIS — R2681 Unsteadiness on feet: Secondary | ICD-10-CM

## 2015-05-03 DIAGNOSIS — G8193 Hemiplegia, unspecified affecting right nondominant side: Secondary | ICD-10-CM

## 2015-05-03 NOTE — Therapy (Signed)
[facility] [address] [place], [place], [address] Phone: [phone]   Fax:  [phone]  Physical Therapy Treatment  Patient Details  Name: Todd [name] MRN: [id number] Date of Birth: 03/03/1941 Referring Provider: Burnetta Sabin, NP  Encounter Date: 05/03/2015      PT End of Session - 05/03/15 1157    Visit Number 22   Number of Visits 25   Date for PT Re-Evaluation 05/08/15   Authorization Type [employer] MCR-Gcodes on every 10th visit.    PT Start Time 1148   PT Stop Time 1232   PT Time Calculation (min) 44 min   Equipment Utilized During Treatment Gait belt   Activity Tolerance Patient limited by fatigue;Patient tolerated treatment well  Continues to require frequent rest breaks due to fatigue   Behavior During Therapy [facility] for tasks assessed/performed      Past Medical History  Diagnosis Date  . PONV (postoperative nausea and vomiting)   . Hyperlipidemia     takes Crestor daily  . Hypertension     takes Amlodipine and Metoprolol  . Coronary artery disease   . 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 Joint pain     knees,elbows,back  . Diabetes mellitus     lantus and novolog;fasting sugars run 70-120  . CKD (chronic kidney disease)     Past Surgical History  Procedure Laterality Date  . Knee arthroscopy  80's    right knee  . Cholecystectomy  20+yrs ago  . Cardiac catheterization  01/2006    2 stents placed  . Back surgery  09/15/10  . Cataract extraction  12/21/10--02/01/11    right and left  . Elbow surgery  2012    right elbow  . Colonoscopy    . Ulnar nerve transposition  03/02/2011    Procedure: ULNAR NERVE DECOMPRESSION/TRANSPOSITION;  Surgeon: Peggyann Shoals, MD;  Location: [facility];  Service: Neurosurgery;  Laterality: Left;  LEFT ulnar nerve decompression  . Total hip arthroplasty      There were no vitals filed for this visit.  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 I drove a little bit and played golf."     Limitations Standing;Walking;House hold activities   Patient Stated Goals to get back to being independent   Currently in Pain? No/denies              Self Care: Had very lengthy discussion with pt and wife regarding upcoming D/C on Friday, reasons for D/C and the importance of return to community activity.  Pt and wife with questions regarding returning to [facility].  PT recommended that he is able to return, however will likely need to decrease intensity from amount he was doing previously.  Wife concerned that pt tends to "over do" it when he walks, works, Social research officer, government.  PT educated pt and wife that pt would need to be responsible for his actions as PT has provided max education on slowly building endurance.  Note that pt has returned to driving very short distances.  Continue to encourage he increase this as he plans to return to work.  Both verbalized understanding.    TE: Performed seated nustep x 6 mins in order to continue to address endurance.  Pt with very fast pace (up to 90 steps per minute).  Encouraged him to decrease to around 70 in order to be able to continue for longer period of time.  Also encourage pt shoot for increased time vs adding resistance.  Pt verbalized understanding.   Gait: Continue to assess gait outdoors.  Pt did better today, however continue to note that he has intermittent mild LOB during transitions and tends to keep UEs on "high guard."  Continue to recommend that he have S outdoors and that he continue to work on this with wife to increase independence with task.                     PT Education - 05/03/15 1157    Education provided Yes   Education Details Max education on returning to  [facility] program for cardiac rehab (slower pace)   Person(s) Educated Patient;Spouse   Methods Explanation   Comprehension Verbalized understanding          PT Short Term Goals - 04/01/15 1251    PT SHORT TERM GOAL #1   Title Pt will initiate HEP for RLE strengthening and balance to indicate decreased fall risk and improved functional mobility.  (Target Date: 03/09/15)   Baseline Met 12/2.   Status Achieved   PT SHORT TERM GOAL #2   Title Pt will perform 6MWT and increase score by 150' in order to indicate functional improvement in endurance. (Target Date: 03/09/15)   Baseline 03/08/15: 1186 feet in 6 minutes with RW with supervision   Status Achieved   PT SHORT TERM GOAL #3   Title Pt will verbalize compliance with walking program to indicate improvement in functional endurance.  (Target Date: 03/09/15)   Baseline 03/08/15: walking at home with walker daily, up to 6 minutes on occasion, otherwise around 4 mintues. walking several times a day.    Status Achieved   PT SHORT TERM GOAL #4   Title Pt will increase gait speed to 2.31 ft/sec in order to indicate decreased fall risk and more efficient gait.  (Target Date: 03/09/15)   Baseline 12/22: gait velocity = 3.14 ft/sec with RW   Status Achieved   PT SHORT TERM GOAL #5   Title Pt will increase DGI to 13/24 in order to indicate decreased fall risk and functional improvemnet in balance. (Target Date: 03/09/15)   Baseline 03/08/15: scored 15/24 today with use of cane   Status Achieved   PT SHORT TERM GOAL #6   Title Pt will ambulate 300' on indoor surfaces with SPC at mod I level in order to indicate safe home negotiation.  (Target Date: 03/09/15)   Baseline met on 03/08/15   Status Achieved           PT Long Term Goals - 04/08/15 1323    PT LONG TERM GOAL #1   Title Pt will be independent with HEP in order to indicate improved RLE strength, balance and decreased fall risk. (Target Date: 04/06/15)   Baseline met 04/08/15    Status Achieved   PT LONG TERM GOAL #2   Title Pt will increase 6MWT distance by 150' in order to indicate improvement in functional endurance.  (Modified Target Date: 05/06/15)   Baseline 1138' without AD (was 1186' on 03/08/15 but that was with RW)   Status Partially Met   PT LONG TERM GOAL #3   Title Pt will increase DGI to 20/24 in order to indicate decreased fall risk and functional  improvement in balance. (Modified Target Date: 05/06/15)   Baseline 1/12: DGI score = 18/24   Status Partially Met   PT LONG TERM GOAL #4   Title Pt will verbalize return to community fitness/leisure activity to indicate return to prior level of function. (Modified Target Date: 05/06/15)   Status On-going   PT LONG TERM GOAL #5   Title Pt will ambulate >500' on outdoor surfaces (curb and ramp included) without AD at mod I level in order to indicate safe community negotiation. Modified Date: 05/06/15)   Baseline S for >500' on paved surfaces 04/08/15   Status On-going   Additional Long Term Goals   Additional Long Term Goals Yes   PT LONG TERM GOAL #6   Title Pt will ambulate >200' without AD while carrying object up to 15lbs in order to indicate safe return to work duties within the home.  (Target Date: 05/06/15)               Plan - 05/03/15 1313    Clinical Impression Statement Skilled session focused on max education regarding returning to [facility] cardiac rehab program at [facility]  MD wanting PT to write note to return.  Will have ready on next visit.  Also discussed beginning walking program to lead up to return to [facility] as ambulation outside today produced increased fatigue.  Pt and wife verbalized understanding.    Pt will benefit from skilled therapeutic intervention in order to improve on the following deficits Abnormal gait;Decreased activity tolerance;Decreased balance;Decreased coordination;Decreased endurance;Decreased mobility;Decreased strength;Impaired perceived functional  ability;Impaired sensation;Impaired UE functional use;Postural dysfunction   Rehab Potential Excellent   PT Frequency 2x / week   PT Duration 8 weeks   PT Treatment/Interventions ADLs/Self Care Home Management;Electrical Stimulation;DME Instruction;Gait training;Stair training;Functional mobility training;Therapeutic activities;Therapeutic exercise;Balance training;Neuromuscular re-education;Patient/family education;Orthotic Fit/Training;Visual/perceptual remediation/compensation   PT Next Visit Plan Check goals and D/C   Consulted and Agree with Plan of Care Patient;Family member/caregiver   Family Member Consulted wife-[name]        Problem List Patient Active Problem List   Diagnosis Date Noted  . Essential hypertension 04/29/2015  . Type 2 diabetes mellitus with neurologic complication ([place]) 79/89/2119  . HLD (hyperlipidemia) 04/29/2015  . Thalamic hemorrhage ([place])   . ICH (intracerebral hemorrhage) ([place]) 02/01/2015  . Abdominal pain 01/25/2015  . Acute hepatitis   . Enteritis   . Ileus ([place])   . Uncontrollable vomiting     Cameron Sprang, PT, MPT [facility] [address] [place], [place], [address] Phone: [phone]   Fax:  [phone] 05/03/2015, 1:17 PM  Name: Todd [name] MRN: [id number] Date of Birth: 1940/10/18

## 2015-05-05 ENCOUNTER — Ambulatory Visit: Payer: Medicare Other | Admitting: Rehabilitative and Restorative Service Providers"

## 2015-05-07 ENCOUNTER — Encounter: Payer: Self-pay | Admitting: Rehabilitation

## 2015-05-07 ENCOUNTER — Ambulatory Visit: Payer: Medicare Other | Admitting: Rehabilitation

## 2015-05-07 DIAGNOSIS — R269 Unspecified abnormalities of gait and mobility: Secondary | ICD-10-CM

## 2015-05-07 DIAGNOSIS — Z7409 Other reduced mobility: Secondary | ICD-10-CM

## 2015-05-07 DIAGNOSIS — IMO0002 Reserved for concepts with insufficient information to code with codable children: Secondary | ICD-10-CM

## 2015-05-07 DIAGNOSIS — R2681 Unsteadiness on feet: Secondary | ICD-10-CM

## 2015-05-07 NOTE — Therapy (Signed)
[facility] [address] [place], [place], [address] Phone: [phone]   Fax:  [phone]  Physical Therapy Treatment and D/C Summary  Patient Details  Name: Todd [name] MRN: [id number] Date of Birth: 1941-02-27 Referring Provider: Burnetta Sabin, NP  Encounter Date: 05/07/2015      PT End of Session - 05/07/15 1452    Visit Number 23   Number of Visits 25   Date for PT Re-Evaluation 05/08/15   Authorization Type [employer] MCR-Gcodes on every 10th visit.    PT Start Time 1446   PT Stop Time 1530   PT Time Calculation (min) 44 min   Equipment Utilized During Treatment Gait belt   Activity Tolerance Patient limited by fatigue;Patient tolerated treatment well  Continues to require frequent rest breaks due to fatigue   Behavior During Therapy [facility] for tasks assessed/performed      Past Medical History  Diagnosis Date  . PONV (postoperative nausea and vomiting)   . Hyperlipidemia     takes Crestor daily  . Hypertension     takes Amlodipine and Metoprolol  . Coronary artery disease   . 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 Joint pain     knees,elbows,back  . Diabetes mellitus     lantus and novolog;fasting sugars run 70-120  . CKD (chronic kidney disease)     Past Surgical History  Procedure Laterality Date  . Knee arthroscopy  80's    right knee  . Cholecystectomy  20+yrs ago  . Cardiac catheterization  01/2006    2 stents placed  . Back surgery  09/15/10  . Cataract extraction  12/21/10--02/01/11    right and left  . Elbow surgery  2012    right elbow  . Colonoscopy    . Ulnar nerve transposition  03/02/2011    Procedure: ULNAR NERVE DECOMPRESSION/TRANSPOSITION;  Surgeon: Peggyann Shoals, MD;  Location: [facility];  Service: Neurosurgery;  Laterality: Left;  LEFT ulnar nerve decompression  . Total hip arthroplasty      There were no vitals filed for this visit.  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 No/denies                         [facility] Adult PT Treatment/Exercise - 05/07/15 1513    Standardized Balance Assessment   Standardized Balance Assessment Dynamic Gait Index   Dynamic Gait Index   Level Surface Mild Impairment   Change in Gait Speed Normal   Gait with Horizontal Head Turns Normal   Gait with Vertical Head Turns Mild Impairment   Gait and Pivot Turn Normal   Step Over Obstacle Mild Impairment   Step Around Obstacles Normal   Steps Mild Impairment   Total Score 20      NMR:  Performed DGI, see full details above.  Note improvement from 18/24 to 20/24, indicative of decreased fall risk.  Requires seated rest break during testing due to fatigue (following 6MWT).  Educated pt on meaning of balance results.  Also had pt ambulate X 200' without AD while carrying 15# in  order to assess pts balance and ability to dual task to simulate work activities.  He was able to complete at mod I level.    TE:  Performed 6MWT during session with new distance of 1033' without AD.  Note marked fatigue today and pt needing to stop and rest once during testing.  Vitals WFL throughout, but note that gait was very antalgic during test.  Pt reports no increased pain, however seemed to have pain from L hip during test.  Allowed increased time to rest following activity with SaO2 at 96% and HR at 87.    Self Care:  Continue to provide max education on dividing time between working on exercises and endurance and work.  Note that pt tends to work all day (has worked all day today) and was very fatigued during this PT session.  Feel that pt does  not have good understanding that work is mentally taxing for him and can carryover to physical fatigue.  Educated on this during session continuously.  Also notified [facility] that he is able to return to cardiac rehab, but at a lesser intensity due to marked CV deficits.  Pt and wife verbalized understanding.             PT Education - 05/07/15 1451    Education provided Yes   Education Details Continued education on return to heart track program for CV benefits/gains as well as decreasing amount he is working, esp on those days to avoid over fatigue.     Person(s) Educated Patient;Spouse   Methods Explanation   Comprehension Verbalized understanding          PT Short Term Goals - 04/01/15 1251    PT SHORT TERM GOAL #1   Title Pt will initiate HEP for RLE strengthening and balance to indicate decreased fall risk and improved functional mobility.  (Target Date: 03/09/15)   Baseline Met 12/2.   Status Achieved   PT SHORT TERM GOAL #2   Title Pt will perform 6MWT and increase score by 150' in order to indicate functional improvement in endurance. (Target Date: 03/09/15)   Baseline 03/08/15: 1186 feet in 6 minutes with RW with supervision   Status Achieved   PT SHORT TERM GOAL #3   Title Pt will verbalize compliance with walking program to indicate improvement in functional endurance.  (Target Date: 03/09/15)   Baseline 03/08/15: walking at home with walker daily, up to 6 minutes on occasion, otherwise around 4 mintues. walking several times a day.    Status Achieved   PT SHORT TERM GOAL #4   Title Pt will increase gait speed to 2.31 ft/sec in order to indicate decreased fall risk and more efficient gait.  (Target Date: 03/09/15)   Baseline 12/22: gait velocity = 3.14 ft/sec with RW   Status Achieved   PT SHORT TERM GOAL #5   Title Pt will increase DGI to 13/24 in order to indicate decreased fall risk and functional improvemnet in balance. (Target Date: 03/09/15)   Baseline  03/08/15: scored 15/24 today with use of cane   Status Achieved   PT SHORT TERM GOAL #6   Title Pt will ambulate 300' on indoor surfaces with SPC at mod I level in order to indicate safe home negotiation.  (Target Date: 03/09/15)   Baseline met on 03/08/15   Status Achieved           PT Long Term Goals - 05/07/15 1453    PT LONG TERM GOAL #1  Title Pt will be independent with HEP in order to indicate improved RLE strength, balance and decreased fall risk. (Target Date: 04/06/15)   Baseline met 04/08/15   Status Achieved   PT LONG TERM GOAL #2   Title Pt will increase 6MWT distance by 150' in order to indicate improvement in functional endurance.  (Modified Target Date: 05/06/15)   Baseline 1033' on 05-14-2015   Status Not Met   PT LONG TERM GOAL #3   Title Pt will increase DGI to 20/24 in order to indicate decreased fall risk and functional improvement in balance. (Modified Target Date: 05/06/15)   Baseline 20/24 DGI on May 14, 2015   Status Achieved   PT LONG TERM GOAL #4   Title Pt will verbalize return to community fitness/leisure activity to indicate return to prior level of function. (Modified Target Date: 05/06/15)   Baseline Pt will return to [facility] following finishing therapy   Status Achieved   PT LONG TERM GOAL #5   Title Pt will ambulate >500' on outdoor surfaces (curb and ramp included) without AD at mod I level in order to indicate safe community negotiation. Modified Date: 05/06/15)   Baseline S on 05/03/15   Status Not Met   PT LONG TERM GOAL #6   Title Pt will ambulate >200' without AD while carrying object up to 15lbs in order to indicate safe return to work duties within the home.  (Target Date: 05/06/15)   Baseline met 05/14/15   Status Achieved               Plan - 05/14/2015 1452    Clinical Impression Statement Pt as met 4/6 LTG's which were addressed during session.  He continues to have marked endurance deficits, therefore did not meet 6MWT goal and  continues to require S for uneven outdoor surfaces.  Encouraged him to practice this with wife in order to increase independence with this.  Recommend he D/C at this time and return to [facility] for cardiac rehab to address endurance deficits, however he will likely have to return at a modified and lower level due to deficits.  Pt and wife verbalized understanding.  PT send note to [facility] to nofify them of this.     Pt will benefit from skilled therapeutic intervention in order to improve on the following deficits Abnormal gait;Decreased activity tolerance;Decreased balance;Decreased coordination;Decreased endurance;Decreased mobility;Decreased strength;Impaired perceived functional ability;Impaired sensation;Impaired UE functional use;Postural dysfunction   Rehab Potential Excellent   PT Frequency 2x / week   PT Duration 8 weeks   PT Treatment/Interventions ADLs/Self Care Home Management;Electrical Stimulation;DME Instruction;Gait training;Stair training;Functional mobility training;Therapeutic activities;Therapeutic exercise;Balance training;Neuromuscular re-education;Patient/family education;Orthotic Fit/Training;Visual/perceptual remediation/compensation   PT Next Visit Plan Check goals and D/C   Consulted and Agree with Plan of Care Patient;Family member/caregiver   Family Member Consulted wife-[name]          G-Codes - 05-14-2015 1542    Functional Assessment Tool Used DGI: 20/24   Functional Limitation Mobility: Walking and moving around   Mobility: Walking and Moving Around Current Status [phone]) At least 1 percent but less than 20 percent impaired, limited or restricted   Mobility: Walking and Moving Around Goal Status [phone]) At least 1 percent but less than 20 percent impaired, limited or restricted   Mobility: Walking and Moving Around Discharge Status [phone]) At least 1 percent but less than 20 percent impaired, limited or restricted      PHYSICAL THERAPY DISCHARGE  SUMMARY  Visits from Start of Care:  23  Current functional level related to goals / functional outcomes: See LTG's above   Remaining deficits: Pt continues to have high level balance deficits and difficulty shifting weight to the R, however has made plateau in therapy and feel pt ready to work on HEP and endurance at home.     Education / Equipment: HEP, return to [facility] for CV benefits   Plan: Patient agrees to discharge.  Patient goals were not met. Patient is being discharged due to meeting the stated rehab goals.  ?????        Problem List Patient Active Problem List   Diagnosis Date Noted  . Essential hypertension 04/29/2015  . Type 2 diabetes mellitus with neurologic complication ([place]) 91/36/8599  . HLD (hyperlipidemia) 04/29/2015  . Thalamic hemorrhage ([place])   . ICH (intracerebral hemorrhage) ([place]) 02/01/2015  . Abdominal pain 01/25/2015  . Acute hepatitis   . Enteritis   . Ileus ([place])   . Uncontrollable vomiting     Cameron Sprang, PT, MPT [facility] [address] [place], [place], [address] Phone: [phone]   Fax:  [phone] 05/07/2015, 3:44 PM  Name: Todd [name] MRN: [id number] Date of Birth: 1940/09/07

## 2015-07-21 ENCOUNTER — Ambulatory Visit (INDEPENDENT_AMBULATORY_CARE_PROVIDER_SITE_OTHER): Payer: Medicare Other | Admitting: Neurology

## 2015-07-21 ENCOUNTER — Encounter: Payer: Self-pay | Admitting: Neurology

## 2015-07-21 VITALS — BP 144/64 | HR 57 | Ht 69.0 in | Wt 214.4 lb

## 2015-07-21 DIAGNOSIS — I610 Nontraumatic intracerebral hemorrhage in hemisphere, subcortical: Secondary | ICD-10-CM | POA: Diagnosis not present

## 2015-07-21 DIAGNOSIS — E1149 Type 2 diabetes mellitus with other diabetic neurological complication: Secondary | ICD-10-CM

## 2015-07-21 DIAGNOSIS — I10 Essential (primary) hypertension: Secondary | ICD-10-CM | POA: Diagnosis not present

## 2015-07-21 DIAGNOSIS — E785 Hyperlipidemia, unspecified: Secondary | ICD-10-CM | POA: Diagnosis not present

## 2015-07-21 NOTE — Patient Instructions (Signed)
-   continue ASA and crestor for stroke prevention - check BP and glucose at home and record  - Follow up with endocrinology, nephrology, and cardiology as scheduled - Follow up with your primary care physician for stroke risk factor modification. Recommend maintain blood pressure goal <130/80, diabetes with hemoglobin A1c goal below 6.5% and lipids with LDL cholesterol goal below 70 mg/dL.  - continue cardio PT/OT - healthy diet and home exercise  - follow up in 6 months.

## 2015-07-21 NOTE — Progress Notes (Signed)
STROKE NEUROLOGY FOLLOW UP NOTE  NAME: Todd [name] DOB: 1940-06-27  REASON FOR VISIT: stroke follow up HISTORY FROM: pt and chart  Today we had the pleasure of seeing Todd [name] in follow-up at our Neurology Clinic. Pt was accompanied by wife.   History Summary Todd [name] is a 75 y.o. male w/ PMHx of HTN, HLD, DM type II, CAD, and CKD stage 3, was admitted on 02/01/15 for right-sided sensory deficits. 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 Other stroke work up including CUS, TTE, LDL and A1C all unremarkable. His symptom getting better and he was discharged in good condition with resume ASA 81mg  in 2 weeks.   04/29/15 follow up - the patient has been doing well. No recurrent events. Has resumed ASA 81mg . BP stable at home as per pt, but in clinic today 167/71. Glucose at home 100-140, on lantus, recent A1C 7.4. Has followed up PCP early last month.   Interval History During the interval time, pt has been doing well. No recurrent stroke like symptoms. No left hip pain but still has left hemiparetic gait. Still feels right leg tight sensation, but better than before. Continued on cardio rehab 3/week. Last A1C 6.7 as per pt, BP and glucose well controlled at home. Today BP 144/64.  REVIEW OF SYSTEMS: Full 14 system review of systems performed and notable only for those listed below and in HPI above, all others are negative:  Constitutional:  chills Cardiovascular: Swelling in legs Ear/Nose/Throat:   Skin: itching Eyes:  Light sensitivity Respiratory:   Gastroitestinal:   Genitourinary:  Hematology/Lymphatic:   Endocrine: cold intolerance Musculoskeletal:  Joint pain Allergy/Immunology:   Neurological:   numbness, weakness  Psychiatric:  Sleep: snoring  The following represents the patient's updated allergies and side effects list: Allergies  Allergen Reactions  . Centrum Other (See Comments)   Bloating, indigestion, chest pain  . Valsartan Other (See Comments)    Unsure of symptoms, was on list from previous hospital and patient was unsure of name of medicine.  . Vitamin D Analogs Other (See Comments)    Bloating and swelling in abdomen region, indigestion. Prescription vit d not otc    The neurologically relevant items on the patient's problem list were reviewed on today's visit.  Neurologic Examination  A problem focused neurological exam (12 or more points of the single system neurologic examination, vital signs counts as 1 point, cranial nerves count for 8 points) was performed.  Blood pressure 144/64, pulse 57, height 5\' 9"  (1.753 m), weight 214 lb 6.4 oz (97.251 kg).  General - Well nourished, well developed, in no apparent distress.  Ophthalmologic - Fundi not visualized due to noncooperation.  Cardiovascular - Regular rate and rhythm.  Mental Status -  Level of arousal and orientation to time, place, and person were intact. Language including expression, naming, repetition, comprehension was assessed and found intact. Fund of Knowledge was assessed and was intact.  Cranial Nerves II - XII - II - Visual field intact OU. III, IV, VI - Extraocular movements intact. V - Facial sensation intact bilaterally. VII - Facial movement intact bilaterally. VIII - Hearing & vestibular intact bilaterally. X - Palate elevates symmetrically. XI - Chin turning & shoulder shrug intact bilaterally. XII - Tongue protrusion intact.  Motor Strength - The patient's strength was normal in all extremities and pronator drift was absent.  Bulk was normal and fasciculations were  absent.   Motor Tone - Muscle tone was assessed at the neck and appendages and was normal.  Reflexes - The patient's reflexes were 1+ in all extremities and he had no pathological reflexes.  Sensory - Light touch, temperature/pinprick were assessed and were decreased on the right LE, 75% of left.     Coordination - The patient had normal movements in the hands and feet with no ataxia or dysmetria.  Tremor was absent.  Gait and Station - left hemiparetic gait due to left hip issue.  Data reviewed: I personally reviewed the images and agree with the radiology interpretations.  Ct Abdomen Pelvis Wo Contrast 01/24/2015 1. Small volume of fluid adjacent to some loops of bowel in the right side of the abdomen (distal ileum). This is nonspecific, but could indicate a focus of enteritis, with some reactive free fluid adjacent to inflamed loops of small bowel. 2. Small ventral hernia in the epigastric region containing only omental fat. No associated bowel incarceration or obstruction at this time. 3. 7 mm left lower lobe pulmonary nodule.   Ct Head Wo Contrast  02/01/2015 Small, acute appearing hemorrhage in the left thalamus is likely hypertensive in nature. Atrophy and chronic microvascular ischemic change.   2D Echocardiogram  - Left ventricle: The cavity size was normal. Wall thickness wasincreased in a pattern of mild LVH. Systolic function was normal.The estimated ejection fraction was in the range of 55% to 60%Wall motion was normal; there were no regional wall motionabnormalities. Doppler parameters are consistent with abnormalleft ventricular relaxation (grade 1 diastolic dysfunction).Doppler parameters are consistent with high ventricular fillingpressure. - Mitral valve: Calcified annulus. Mobility was not restricted.Transvalvular velocity was within the normal range. There was noevidence for stenosis. There was mild regurgitation. - Right ventricle: The cavity size was normal. Wall thickness wasnormal. Systolic function was normal. - Atrial septum: No defect or patent foramen ovale was identified. - Tricuspid valve: There was mild regurgitation. - Pulmonary arteries: Systolic pressure was mildly increased. PApeak pressure: 34 mm Hg (S). - Inferior vena cava: The vessel  was normal in size. Therespirophasic diameter changes were in the normal range (>= 50%),consistent with normal central venous pressure.  Carotid Doppler  Bilateral - 1% to 39% ICA stenosis. ECA stenosis. Vertebral artery flow is antegrade.  Component     Latest Ref Rng 01/25/2015 02/02/2015  Cholesterol     0 - 200 mg/dL  109  Triglycerides     <150 mg/dL  181 (H)  HDL Cholesterol     >40 mg/dL  31 (L)  Total CHOL/HDL Ratio       3.5  VLDL     0 - 40 mg/dL  36  LDL (calc)     0 - 99 mg/dL  42  TSH     0.350 - 4.500 uIU/mL 6.162 (H)   Hemoglobin A1C     4.0 - 6.0 % 6.1 (H)     Assessment: As you may recall, he is a 75 y.o. 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 Other stroke work up including CUS, TTE, LDL and A1C all unremarkable. His symptom getting better and he was discharged in good condition with resume ASA 81mg  in 2 weeks. During the interval time, the patient has been doing well, stated BP and glucose in control at home.  Plan:  -  continue ASA and crestor for stroke prevention - check BP and glucose at home and record  - Follow up with endocrinology, nephrology, and cardiology as scheduled - Follow up with your primary care physician for stroke risk factor modification. Recommend maintain blood pressure goal <130/80, diabetes with hemoglobin A1c goal below 6.5% and lipids with LDL cholesterol goal below 70 mg/dL.  - continue cardio PT/OT - healthy diet and home exercise  - follow up in 6 months.    No orders of the defined types were placed in this encounter.    No orders of the defined types were placed in this encounter.    Patient Instructions  - continue ASA and crestor for stroke prevention - check BP and glucose at home and record  - Follow up with endocrinology, nephrology, and cardiology as scheduled -  Follow up with your primary care physician for stroke risk factor modification. Recommend maintain blood pressure goal <130/80, diabetes with hemoglobin A1c goal below 6.5% and lipids with LDL cholesterol goal below 70 mg/dL.  - continue cardio PT/OT - healthy diet and home exercise  - follow up in 6 months.     Rosalin Hawking, MD PhD [facility] [address] [place], [place] [address] [phone]

## 2015-08-02 ENCOUNTER — Ambulatory Visit: Payer: Medicare Other | Admitting: Neurology

## 2015-09-22 ENCOUNTER — Other Ambulatory Visit: Payer: Self-pay | Admitting: Infectious Diseases

## 2015-09-22 DIAGNOSIS — R600 Localized edema: Secondary | ICD-10-CM

## 2015-09-24 ENCOUNTER — Ambulatory Visit
Admission: RE | Admit: 2015-09-24 | Discharge: 2015-09-24 | Disposition: A | Discharge status: Home / Self Care | Payer: Medicare Other | Source: Ambulatory Visit | Attending: Infectious Diseases | Admitting: Infectious Diseases

## 2015-09-24 DIAGNOSIS — M7122 Synovial cyst of popliteal space [Baker], left knee: Secondary | ICD-10-CM | POA: Insufficient documentation

## 2015-09-24 DIAGNOSIS — R600 Localized edema: Secondary | ICD-10-CM | POA: Diagnosis present

## 2015-12-10 ENCOUNTER — Inpatient Hospital Stay
Admission: EM | Admit: 2015-12-10 | Discharge: 2015-12-25 | DRG: 193 | Payer: Medicare Other | Attending: Specialist | Admitting: Specialist

## 2015-12-10 ENCOUNTER — Encounter: Payer: Self-pay | Admitting: Emergency Medicine

## 2015-12-10 DIAGNOSIS — T80212A Local infection due to central venous catheter, initial encounter: Secondary | ICD-10-CM

## 2015-12-10 DIAGNOSIS — I10 Essential (primary) hypertension: Secondary | ICD-10-CM | POA: Diagnosis present

## 2015-12-10 DIAGNOSIS — I255 Ischemic cardiomyopathy: Secondary | ICD-10-CM | POA: Diagnosis present

## 2015-12-10 DIAGNOSIS — Z6826 Body mass index (BMI) 26.0-26.9, adult: Secondary | ICD-10-CM

## 2015-12-10 DIAGNOSIS — R0902 Hypoxemia: Secondary | ICD-10-CM

## 2015-12-10 DIAGNOSIS — J90 Pleural effusion, not elsewhere classified: Secondary | ICD-10-CM | POA: Diagnosis present

## 2015-12-10 DIAGNOSIS — R262 Difficulty in walking, not elsewhere classified: Secondary | ICD-10-CM

## 2015-12-10 DIAGNOSIS — E785 Hyperlipidemia, unspecified: Secondary | ICD-10-CM | POA: Diagnosis present

## 2015-12-10 DIAGNOSIS — J9601 Acute respiratory failure with hypoxia: Secondary | ICD-10-CM | POA: Diagnosis present

## 2015-12-10 DIAGNOSIS — Z992 Dependence on renal dialysis: Secondary | ICD-10-CM

## 2015-12-10 DIAGNOSIS — R0600 Dyspnea, unspecified: Secondary | ICD-10-CM

## 2015-12-10 DIAGNOSIS — I25119 Atherosclerotic heart disease of native coronary artery with unspecified angina pectoris: Secondary | ICD-10-CM | POA: Diagnosis present

## 2015-12-10 DIAGNOSIS — I129 Hypertensive chronic kidney disease with stage 1 through stage 4 chronic kidney disease, or unspecified chronic kidney disease: Secondary | ICD-10-CM | POA: Diagnosis present

## 2015-12-10 DIAGNOSIS — N2581 Secondary hyperparathyroidism of renal origin: Secondary | ICD-10-CM | POA: Diagnosis present

## 2015-12-10 DIAGNOSIS — E1122 Type 2 diabetes mellitus with diabetic chronic kidney disease: Secondary | ICD-10-CM | POA: Diagnosis present

## 2015-12-10 DIAGNOSIS — I2510 Atherosclerotic heart disease of native coronary artery without angina pectoris: Secondary | ICD-10-CM | POA: Diagnosis present

## 2015-12-10 DIAGNOSIS — L8990 Pressure ulcer of unspecified site, unspecified stage: Secondary | ICD-10-CM | POA: Insufficient documentation

## 2015-12-10 DIAGNOSIS — Z23 Encounter for immunization: Secondary | ICD-10-CM

## 2015-12-10 DIAGNOSIS — Z8249 Family history of ischemic heart disease and other diseases of the circulatory system: Secondary | ICD-10-CM

## 2015-12-10 DIAGNOSIS — N186 End stage renal disease: Secondary | ICD-10-CM | POA: Diagnosis present

## 2015-12-10 DIAGNOSIS — Z79899 Other long term (current) drug therapy: Secondary | ICD-10-CM

## 2015-12-10 DIAGNOSIS — R2689 Other abnormalities of gait and mobility: Secondary | ICD-10-CM

## 2015-12-10 DIAGNOSIS — Z794 Long term (current) use of insulin: Secondary | ICD-10-CM

## 2015-12-10 DIAGNOSIS — R0602 Shortness of breath: Secondary | ICD-10-CM | POA: Diagnosis not present

## 2015-12-10 DIAGNOSIS — D631 Anemia in chronic kidney disease: Secondary | ICD-10-CM | POA: Diagnosis present

## 2015-12-10 DIAGNOSIS — E669 Obesity, unspecified: Secondary | ICD-10-CM | POA: Diagnosis present

## 2015-12-10 DIAGNOSIS — N179 Acute kidney failure, unspecified: Secondary | ICD-10-CM | POA: Diagnosis not present

## 2015-12-10 DIAGNOSIS — I951 Orthostatic hypotension: Secondary | ICD-10-CM | POA: Diagnosis not present

## 2015-12-10 DIAGNOSIS — I214 Non-ST elevation (NSTEMI) myocardial infarction: Secondary | ICD-10-CM | POA: Diagnosis not present

## 2015-12-10 DIAGNOSIS — J189 Pneumonia, unspecified organism: Principal | ICD-10-CM | POA: Diagnosis present

## 2015-12-10 DIAGNOSIS — Z833 Family history of diabetes mellitus: Secondary | ICD-10-CM

## 2015-12-10 DIAGNOSIS — Z888 Allergy status to other drugs, medicaments and biological substances status: Secondary | ICD-10-CM

## 2015-12-10 DIAGNOSIS — Z8673 Personal history of transient ischemic attack (TIA), and cerebral infarction without residual deficits: Secondary | ICD-10-CM

## 2015-12-10 DIAGNOSIS — E875 Hyperkalemia: Secondary | ICD-10-CM | POA: Diagnosis present

## 2015-12-10 DIAGNOSIS — Z7982 Long term (current) use of aspirin: Secondary | ICD-10-CM

## 2015-12-10 DIAGNOSIS — Y95 Nosocomial condition: Secondary | ICD-10-CM | POA: Diagnosis present

## 2015-12-10 DIAGNOSIS — E872 Acidosis: Secondary | ICD-10-CM | POA: Diagnosis present

## 2015-12-10 DIAGNOSIS — R000 Tachycardia, unspecified: Secondary | ICD-10-CM | POA: Diagnosis present

## 2015-12-10 DIAGNOSIS — K5900 Constipation, unspecified: Secondary | ICD-10-CM

## 2015-12-10 DIAGNOSIS — Z955 Presence of coronary angioplasty implant and graft: Secondary | ICD-10-CM

## 2015-12-10 DIAGNOSIS — J9691 Respiratory failure, unspecified with hypoxia: Secondary | ICD-10-CM

## 2015-12-10 DIAGNOSIS — R112 Nausea with vomiting, unspecified: Secondary | ICD-10-CM

## 2015-12-10 DIAGNOSIS — E1149 Type 2 diabetes mellitus with other diabetic neurological complication: Secondary | ICD-10-CM | POA: Diagnosis present

## 2015-12-10 HISTORY — DX: Cerebral infarction, unspecified: I63.9

## 2015-12-10 LAB — CBC
HCT: 31.7 % — ABNORMAL LOW (ref 40.0–52.0)
Hemoglobin: 10.8 g/dL — ABNORMAL LOW (ref 13.0–18.0)
MCH: 32.3 pg (ref 26.0–34.0)
MCHC: 34 g/dL (ref 32.0–36.0)
MCV: 94.8 fL (ref 80.0–100.0)
Platelets: 182 10*3/uL (ref 150–440)
RBC: 3.34 MIL/uL — ABNORMAL LOW (ref 4.40–5.90)
RDW: 13.1 % (ref 11.5–14.5)
WBC: 9.1 10*3/uL (ref 3.8–10.6)

## 2015-12-10 NOTE — ED Provider Notes (Signed)
[facility] Emergency Department Provider Note   ____________________________________________   First MD Initiated Contact with Patient 12/10/15 2355     (approximate)  I have reviewed the triage vital signs and the nursing notes.   HISTORY  Chief Complaint Shortness of Breath    HPI Todd [name] is a 75 y.o. male who presents to the ED from home via EMS with a chief complaint of shortness of breath.Patient has a history of chronic kidney disease status post peritoneal dialysis catheter placement today at [facility]. Spouse states he received one half Percocet approximately 7 PM prior to discharge. Patient took another Percocet at 10 PM. Spouse feels this attributed to his shortness of breath symptoms. Patient denies history of respiratory disease. Denies recent fever, chills, chest pain, abdominal pain, nausea, vomiting, diarrhea. Does report increased fluid on his legs. Spouse states he has had increased fluid to his arms this week. Denies recent travel or trauma. Nothing makes his symptoms better or worse.   Past Medical History:  Diagnosis Date  . 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 CKD (chronic kidney disease)   . Coronary artery disease   . Diabetes mellitus    lantus and novolog;fasting sugars run 70-120  . Hyperlipidemia    takes Crestor daily  . Hypertension    takes Amlodipine and Metoprolol  . Joint pain    knees,elbows,back  . PONV (postoperative nausea and vomiting)     Patient Active Problem List   Diagnosis Date Noted  . Essential hypertension 04/29/2015  . Type 2 diabetes mellitus with neurologic complication ([place]) [id number]  . HLD (hyperlipidemia) 04/29/2015  . Thalamic hemorrhage ([place])   . ICH (intracerebral hemorrhage) ([place]) 02/01/2015  . Abdominal pain 01/25/2015  . Acute hepatitis   . Enteritis   . Ileus ([place])   . Uncontrollable vomiting     Past Surgical History:  Procedure Laterality Date  . BACK SURGERY  09/15/10  .  CARDIAC CATHETERIZATION  01/2006   2 stents placed  . CATARACT EXTRACTION  12/21/10--02/01/11   right and left  . CHOLECYSTECTOMY  20+yrs ago  . COLONOSCOPY    . ELBOW SURGERY  2012   right elbow  . KNEE ARTHROSCOPY  80's   right knee  . TOTAL HIP ARTHROPLASTY    . ULNAR NERVE TRANSPOSITION  03/02/2011   Procedure: ULNAR NERVE DECOMPRESSION/TRANSPOSITION;  Surgeon: Peggyann Shoals, MD;  Location: [place] NEURO ORS;  Service: Neurosurgery;  Laterality: Left;  LEFT ulnar nerve decompression    Prior to Admission medications   Medication Sig Start Date End Date Taking? Authorizing Provider  amLODipine (NORVASC) 10 MG tablet  04/05/15   Historical Provider, MD  aspirin EC 81 MG tablet Take 1 tablet (81 mg total) by mouth daily. 02/18/15   Donzetta Starch, NP  carvedilol (COREG) 6.25 MG tablet Take 2 tablets (12.5 mg total) by mouth 2 (two) times daily with a meal. 01/27/15   Nicholes Mango, MD  cholecalciferol (VITAMIN D) 1000 UNITS tablet Take 1,000 Units by mouth daily.      Historical Provider, MD  glucose blood (ONE TOUCH ULTRA TEST) test strip  09/08/14   Historical Provider, MD  insulin glargine (LANTUS) 100 UNIT/ML injection Inject 11 Units into the skin at bedtime.     Historical Provider, MD  insulin lispro (HUMALOG) 100 UNIT/ML injection Inject 6 Units into the skin daily with supper.    Historical Provider, MD  ONE TOUCH ULTRA TEST test strip  03/16/15  Historical Provider, MD  rosuvastatin (CRESTOR) 20 MG tablet  02/23/15   Historical Provider, MD  sodium bicarbonate 650 MG tablet Take 1,950 mg by mouth 3 (three) times daily.     Historical Provider, MD  sodium polystyrene (KAYEXALATE) 15 GM/60ML suspension Take by mouth. Takes it weekly 04/06/15   Historical Provider, MD    Allergies Centrum; Valsartan; and Vitamin d analogs  Family History  Problem Relation Age of Onset  . Anesthesia problems Neg Hx   . Hypotension Neg Hx   . Malignant hyperthermia Neg Hx   . Pseudochol deficiency Neg  Hx     Social History Social History  Substance Use Topics  . Smoking status: Never Smoker  . Smokeless tobacco: Never Used  . Alcohol use No    Review of Systems  Constitutional: No fever/chills. Eyes: No visual changes. ENT: No sore throat. Cardiovascular: Denies chest pain. Respiratory: Positive for shortness of breath. Gastrointestinal: No abdominal pain.  No nausea, no vomiting.  No diarrhea.  No constipation. Genitourinary: Negative for dysuria. Musculoskeletal: Negative for back pain. Skin: Negative for rash. Neurological: Negative for headaches, focal weakness or numbness.  10-point ROS otherwise negative.  ____________________________________________   PHYSICAL EXAM:  VITAL SIGNS: ED Triage Vitals  Enc Vitals Group     BP --      Pulse Rate 12/10/15 2348 94     Resp 12/10/15 2348 20     Temp 12/10/15 2348 97.7 F (36.5 C)     Temp Source 12/10/15 2348 Oral     SpO2 12/10/15 2348 (!) 85 %     Weight 12/10/15 2349 195 lb (88.5 kg)     Height 12/10/15 2349 5\' 9"  (1.753 m)     Head Circumference --      Peak Flow --      Pain Score 12/10/15 2349 0     Pain Loc --      Pain Edu? --      Excl. in [place]? --     Constitutional: Alert and oriented. Well appearing and in mild acute distress. Eyes: Conjunctivae are normal. PERRL. EOMI. Head: Atraumatic. Nose: No congestion/rhinnorhea. Mouth/Throat: Mucous membranes are moist.  Oropharynx non-erythematous. Neck: No stridor.   Cardiovascular: Normal rate, regular rhythm. Grossly normal heart sounds.  Good peripheral circulation. Respiratory: Increased respiratory effort.  No retractions. Lungs diminished bibasilarly. Gastrointestinal: Soft and nontender. No distention. No abdominal bruits. No CVA tenderness. Musculoskeletal: No lower extremity tenderness. 1+ nonpitting BLE edema.  No joint effusions. Neurologic:  Normal speech and language. No gross focal neurologic deficits are appreciated.  Skin:  Skin is  warm, dry and intact. No rash noted. Psychiatric: Mood and affect are normal. Speech and behavior are normal.  ____________________________________________   LABS (all labs ordered are listed, but only abnormal results are displayed)  Labs Reviewed  BASIC METABOLIC PANEL - Abnormal; Notable for the following:       Result Value   Potassium 5.3 (*)    Chloride 115 (*)    CO2 16 (*)    Glucose, Bld 173 (*)    BUN 71 (*)    Creatinine, Ser 4.58 (*)    Calcium 6.9 (*)    GFR calc non Af Amer 11 (*)    GFR calc Af Amer 13 (*)    All other components within normal limits  CBC - Abnormal; Notable for the following:    RBC 3.34 (*)    Hemoglobin 10.8 (*)    HCT 31.7 (*)  All other components within normal limits  HEPATIC FUNCTION PANEL - Abnormal; Notable for the following:    Albumin 3.2 (*)    ALT 16 (*)    Bilirubin, Direct <0.1 (*)    All other components within normal limits  BRAIN NATRIURETIC PEPTIDE - Abnormal; Notable for the following:    B Natriuretic Peptide 297.0 (*)    All other components within normal limits  BLOOD GAS, ARTERIAL - Abnormal; Notable for the following:    pH, Arterial 7.22 (*)    pO2, Arterial <31.0 (*)    Bicarbonate 17.6 (*)    Acid-base deficit 9.6 (*)    All other components within normal limits  TROPONIN I  LIPASE, BLOOD  LACTIC ACID, PLASMA  LACTIC ACID, PLASMA   ____________________________________________  EKG  ED ECG REPORT I, Primrose Oler J, the attending physician, personally viewed and interpreted this ECG.   Date: 12/11/2015  EKG Time: 2353  Rate: 95  Rhythm: normal EKG, normal sinus rhythm  Axis: LAD  Intervals:left bundle branch block  ST&T Change: ST depression inferior laterally  ____________________________________________  RADIOLOGY  Portable chest x-ray (viewed by me, interpreted per Dr. Gerilyn Nestle): Mild cardiac enlargement. Left perihilar infiltration likely  representing pneumonia. Followup PA and lateral chest  X-ray is  recommended in 3-4 weeks following appropriate therapy to ensure  resolution and exclude underlying malignancy. Small right pleural  effusion.   ____________________________________________   PROCEDURES  Procedure(s) performed: None  Procedures  Critical Care performed: Yes, see critical care note(s)   CRITICAL CARE Performed by: Paulette Blanch   Total critical care time: 45 minutes  Critical care time was exclusive of separately billable procedures and treating other patients.  Critical care was necessary to treat or prevent imminent or life-threatening deterioration.  Critical care was time spent personally by me on the following activities: development of treatment plan with patient and/or surrogate as well as nursing, discussions with consultants, evaluation of patient's response to treatment, examination of patient, obtaining history from patient or surrogate, ordering and performing treatments and interventions, ordering and review of laboratory studies, ordering and review of radiographic studies, pulse oximetry and re-evaluation of patient's condition.  ____________________________________________   INITIAL IMPRESSION / ASSESSMENT AND PLAN / ED COURSE  Pertinent labs & imaging results that were available during my care of the patient were reviewed by me and considered in my medical decision making (see chart for details).  75 year old male who presents in respiratory distress status post peritoneal dialysis catheter insertion today. Room air saturations 85%; 98% on nonrebreather oxygen. Review of records reveal patient was cleared by his cardiologist on 9/6 for PD insertion. Will obtain screening lab work, portable chest x-ray and reassess.  Clinical Course  Comment By Time  Patient is improved on BiPAP. Updated patient and spouse of laboratory and imaging results. Discussed with hospitalist who will evaluate patient in the emergency department for admission. ED  code sepsis was not called as patient is not febrile, altered, nor hypotensive. Paulette Blanch, MD 09/23 0110     ____________________________________________   FINAL CLINICAL IMPRESSION(S) / ED DIAGNOSES  Final diagnoses:  Hypoxia  SOB (shortness of breath)  HCAP (healthcare-associated pneumonia)      NEW MEDICATIONS STARTED DURING THIS VISIT:  New Prescriptions   No medications on file     Note:  This document was prepared using Dragon voice recognition software and may include unintentional dictation errors.    Paulette Blanch, MD 12/11/15 [phone]

## 2015-12-10 NOTE — ED Notes (Signed)
Pt placed on NRB.

## 2015-12-10 NOTE — ED Triage Notes (Signed)
Pt to rm 2 via EMS from home, c/o difficulty breathing, was d/ced today after having admission at [facility] for peritoneal dialysis catheter placement.  Pt w/ cardiac hx.  Pt denies CP.  Pt NAD at this time, resp equal and unlabored, skin warm and dry.

## 2015-12-10 NOTE — ED Notes (Addendum)
Pt placed on 6L of o2 via [place]

## 2015-12-11 ENCOUNTER — Emergency Department: Payer: Medicare Other

## 2015-12-11 ENCOUNTER — Encounter: Payer: Self-pay | Admitting: Internal Medicine

## 2015-12-11 DIAGNOSIS — R000 Tachycardia, unspecified: Secondary | ICD-10-CM | POA: Diagnosis present

## 2015-12-11 DIAGNOSIS — E1149 Type 2 diabetes mellitus with other diabetic neurological complication: Secondary | ICD-10-CM | POA: Diagnosis present

## 2015-12-11 DIAGNOSIS — I25119 Atherosclerotic heart disease of native coronary artery with unspecified angina pectoris: Secondary | ICD-10-CM | POA: Diagnosis present

## 2015-12-11 DIAGNOSIS — N179 Acute kidney failure, unspecified: Secondary | ICD-10-CM | POA: Diagnosis not present

## 2015-12-11 DIAGNOSIS — I951 Orthostatic hypotension: Secondary | ICD-10-CM | POA: Diagnosis not present

## 2015-12-11 DIAGNOSIS — T80212A Local infection due to central venous catheter, initial encounter: Secondary | ICD-10-CM | POA: Diagnosis not present

## 2015-12-11 DIAGNOSIS — Y95 Nosocomial condition: Secondary | ICD-10-CM | POA: Diagnosis present

## 2015-12-11 DIAGNOSIS — I129 Hypertensive chronic kidney disease with stage 1 through stage 4 chronic kidney disease, or unspecified chronic kidney disease: Secondary | ICD-10-CM | POA: Diagnosis present

## 2015-12-11 DIAGNOSIS — I10 Essential (primary) hypertension: Secondary | ICD-10-CM | POA: Diagnosis not present

## 2015-12-11 DIAGNOSIS — D631 Anemia in chronic kidney disease: Secondary | ICD-10-CM | POA: Diagnosis present

## 2015-12-11 DIAGNOSIS — J9601 Acute respiratory failure with hypoxia: Secondary | ICD-10-CM | POA: Diagnosis present

## 2015-12-11 DIAGNOSIS — I214 Non-ST elevation (NSTEMI) myocardial infarction: Secondary | ICD-10-CM | POA: Diagnosis not present

## 2015-12-11 DIAGNOSIS — Z23 Encounter for immunization: Secondary | ICD-10-CM | POA: Diagnosis not present

## 2015-12-11 DIAGNOSIS — E669 Obesity, unspecified: Secondary | ICD-10-CM | POA: Diagnosis present

## 2015-12-11 DIAGNOSIS — E872 Acidosis: Secondary | ICD-10-CM | POA: Diagnosis present

## 2015-12-11 DIAGNOSIS — I255 Ischemic cardiomyopathy: Secondary | ICD-10-CM | POA: Diagnosis present

## 2015-12-11 DIAGNOSIS — N186 End stage renal disease: Secondary | ICD-10-CM | POA: Diagnosis present

## 2015-12-11 DIAGNOSIS — Z992 Dependence on renal dialysis: Secondary | ICD-10-CM | POA: Diagnosis not present

## 2015-12-11 DIAGNOSIS — R0902 Hypoxemia: Secondary | ICD-10-CM | POA: Diagnosis not present

## 2015-12-11 DIAGNOSIS — J189 Pneumonia, unspecified organism: Secondary | ICD-10-CM | POA: Diagnosis present

## 2015-12-11 DIAGNOSIS — E875 Hyperkalemia: Secondary | ICD-10-CM | POA: Diagnosis present

## 2015-12-11 DIAGNOSIS — E785 Hyperlipidemia, unspecified: Secondary | ICD-10-CM | POA: Diagnosis present

## 2015-12-11 DIAGNOSIS — E1122 Type 2 diabetes mellitus with diabetic chronic kidney disease: Secondary | ICD-10-CM | POA: Diagnosis present

## 2015-12-11 DIAGNOSIS — N19 Unspecified kidney failure: Secondary | ICD-10-CM | POA: Diagnosis not present

## 2015-12-11 DIAGNOSIS — I2510 Atherosclerotic heart disease of native coronary artery without angina pectoris: Secondary | ICD-10-CM | POA: Diagnosis present

## 2015-12-11 DIAGNOSIS — J90 Pleural effusion, not elsewhere classified: Secondary | ICD-10-CM | POA: Diagnosis present

## 2015-12-11 DIAGNOSIS — Z833 Family history of diabetes mellitus: Secondary | ICD-10-CM | POA: Diagnosis not present

## 2015-12-11 DIAGNOSIS — Z6826 Body mass index (BMI) 26.0-26.9, adult: Secondary | ICD-10-CM | POA: Diagnosis not present

## 2015-12-11 DIAGNOSIS — R0602 Shortness of breath: Secondary | ICD-10-CM | POA: Diagnosis present

## 2015-12-11 DIAGNOSIS — N2581 Secondary hyperparathyroidism of renal origin: Secondary | ICD-10-CM | POA: Diagnosis present

## 2015-12-11 LAB — CBC
HEMATOCRIT: 30.2 % — AB (ref 40.0–52.0)
Hemoglobin: 10.5 g/dL — ABNORMAL LOW (ref 13.0–18.0)
MCH: 32.8 pg (ref 26.0–34.0)
MCHC: 34.6 g/dL (ref 32.0–36.0)
MCV: 94.8 fL (ref 80.0–100.0)
PLATELETS: 184 10*3/uL (ref 150–440)
RBC: 3.19 MIL/uL — ABNORMAL LOW (ref 4.40–5.90)
RDW: 13.1 % (ref 11.5–14.5)
WBC: 9.4 10*3/uL (ref 3.8–10.6)

## 2015-12-11 LAB — BASIC METABOLIC PANEL
Anion gap: 10 (ref 5–15)
Anion gap: 11 (ref 5–15)
BUN: 71 mg/dL — AB (ref 6–20)
BUN: 71 mg/dL — AB (ref 6–20)
CALCIUM: 6.9 mg/dL — AB (ref 8.9–10.3)
CHLORIDE: 114 mmol/L — AB (ref 101–111)
CO2: 16 mmol/L — AB (ref 22–32)
CO2: 18 mmol/L — ABNORMAL LOW (ref 22–32)
CREATININE: 4.58 mg/dL — AB (ref 0.61–1.24)
CREATININE: 4.74 mg/dL — AB (ref 0.61–1.24)
Calcium: 6.9 mg/dL — ABNORMAL LOW (ref 8.9–10.3)
Chloride: 115 mmol/L — ABNORMAL HIGH (ref 101–111)
GFR calc Af Amer: 13 mL/min — ABNORMAL LOW (ref >60)
GFR calc Af Amer: 13 mL/min — ABNORMAL LOW (ref >60)
GFR calc non Af Amer: 11 mL/min — ABNORMAL LOW (ref >60)
GFR, EST NON AFRICAN AMERICAN: 11 mL/min — AB (ref >60)
GLUCOSE: 173 mg/dL — AB (ref 65–99)
GLUCOSE: 231 mg/dL — AB (ref 65–99)
POTASSIUM: 5.3 mmol/L — AB (ref 3.5–5.1)
Potassium: 5.3 mmol/L — ABNORMAL HIGH (ref 3.5–5.1)
SODIUM: 142 mmol/L (ref 135–145)
Sodium: 142 mmol/L (ref 135–145)

## 2015-12-11 LAB — MRSA PCR SCREENING: MRSA by PCR: NEGATIVE

## 2015-12-11 LAB — GLUCOSE, CAPILLARY
GLUCOSE-CAPILLARY: 195 mg/dL — AB (ref 65–99)
Glucose-Capillary: 248 mg/dL — ABNORMAL HIGH (ref 65–99)
Glucose-Capillary: 209 mg/dL — ABNORMAL HIGH (ref 65–99)
Glucose-Capillary: 157 mg/dL — ABNORMAL HIGH (ref 65–99)
Glucose-Capillary: 167 mg/dL — ABNORMAL HIGH (ref 65–99)

## 2015-12-11 LAB — LIPASE, BLOOD: LIPASE: 15 U/L (ref 11–51)

## 2015-12-11 LAB — TROPONIN I

## 2015-12-11 LAB — HEPATIC FUNCTION PANEL
ALT: 16 U/L — AB (ref 17–63)
AST: 25 U/L (ref 15–41)
Albumin: 3.2 g/dL — ABNORMAL LOW (ref 3.5–5.0)
Alkaline Phosphatase: 70 U/L (ref 38–126)
TOTAL PROTEIN: 6.5 g/dL (ref 6.5–8.1)
Total Bilirubin: 0.7 mg/dL (ref 0.3–1.2)

## 2015-12-11 LAB — LACTIC ACID, PLASMA: LACTIC ACID, VENOUS: 1.1 mmol/L (ref 0.5–1.9)

## 2015-12-11 LAB — BRAIN NATRIURETIC PEPTIDE: B Natriuretic Peptide: 297 pg/mL — ABNORMAL HIGH (ref 0.0–100.0)

## 2015-12-11 MED ORDER — SODIUM BICARBONATE 650 MG PO TABS
1950.0000 mg | ORAL_TABLET | Freq: Three times a day (TID) | ORAL | Status: DC
  Administered 2015-12-11 – 2015-12-21 (×28): 1950 mg via ORAL
  Filled 2015-12-11 (×29): qty 3

## 2015-12-11 MED ORDER — SODIUM POLYSTYRENE SULFONATE 15 GM/60ML PO SUSP
15.0000 g | Freq: Once | ORAL | Status: AC
  Administered 2015-12-11: 15 g via ORAL
  Filled 2015-12-11: qty 60

## 2015-12-11 MED ORDER — LORAZEPAM 2 MG/ML IJ SOLN
0.5000 mg | Freq: Four times a day (QID) | INTRAMUSCULAR | Status: DC | PRN
  Administered 2015-12-18: 0.5 mg via INTRAVENOUS
  Filled 2015-12-11: qty 1

## 2015-12-11 MED ORDER — ORAL CARE MOUTH RINSE
15.0000 mL | Freq: Two times a day (BID) | OROMUCOSAL | Status: DC
  Administered 2015-12-11 – 2015-12-24 (×19): 15 mL via OROMUCOSAL

## 2015-12-11 MED ORDER — METOLAZONE 5 MG PO TABS
10.0000 mg | ORAL_TABLET | Freq: Every day | ORAL | Status: DC
  Administered 2015-12-11 – 2015-12-16 (×6): 10 mg via ORAL
  Filled 2015-12-11 (×2): qty 1
  Filled 2015-12-11 (×2): qty 2
  Filled 2015-12-11 (×3): qty 1

## 2015-12-11 MED ORDER — DEXTROSE 5 % IV SOLN
6.0000 mg/h | INTRAVENOUS | Status: DC
  Administered 2015-12-11: 10 mg/h via INTRAVENOUS
  Administered 2015-12-12 – 2015-12-14 (×2): 8 mg/h via INTRAVENOUS
  Administered 2015-12-14: 6 mg/h via INTRAVENOUS
  Filled 2015-12-11 (×4): qty 25

## 2015-12-11 MED ORDER — SODIUM POLYSTYRENE SULFONATE 15 GM/60ML PO SUSP
15.0000 g | ORAL | Status: DC
  Filled 2015-12-11: qty 60

## 2015-12-11 MED ORDER — INSULIN ASPART 100 UNIT/ML ~~LOC~~ SOLN
0.0000 [IU] | Freq: Three times a day (TID) | SUBCUTANEOUS | Status: DC
  Administered 2015-12-11: 2 [IU] via SUBCUTANEOUS
  Administered 2015-12-11: 3 [IU] via SUBCUTANEOUS
  Administered 2015-12-11 – 2015-12-12 (×2): 2 [IU] via SUBCUTANEOUS
  Administered 2015-12-12 – 2015-12-13 (×3): 3 [IU] via SUBCUTANEOUS
  Administered 2015-12-13 (×2): 2 [IU] via SUBCUTANEOUS
  Administered 2015-12-14: 3 [IU] via SUBCUTANEOUS
  Administered 2015-12-14: 2 [IU] via SUBCUTANEOUS
  Administered 2015-12-14: 5 [IU] via SUBCUTANEOUS
  Administered 2015-12-15 (×2): 3 [IU] via SUBCUTANEOUS
  Administered 2015-12-15: 2 [IU] via SUBCUTANEOUS
  Administered 2015-12-16: 3 [IU] via SUBCUTANEOUS
  Administered 2015-12-16 – 2015-12-17 (×3): 2 [IU] via SUBCUTANEOUS
  Administered 2015-12-17: 3 [IU] via SUBCUTANEOUS
  Administered 2015-12-17: 2 [IU] via SUBCUTANEOUS
  Administered 2015-12-18: 7 [IU] via SUBCUTANEOUS
  Administered 2015-12-18 – 2015-12-19 (×3): 5 [IU] via SUBCUTANEOUS
  Administered 2015-12-19: 7 [IU] via SUBCUTANEOUS
  Administered 2015-12-20 (×2): 3 [IU] via SUBCUTANEOUS
  Administered 2015-12-21: 2 [IU] via SUBCUTANEOUS
  Administered 2015-12-21 – 2015-12-22 (×3): 1 [IU] via SUBCUTANEOUS
  Administered 2015-12-22: 2 [IU] via SUBCUTANEOUS
  Administered 2015-12-22: 1 [IU] via SUBCUTANEOUS
  Administered 2015-12-23: 2 [IU] via SUBCUTANEOUS
  Administered 2015-12-23: 1 [IU] via SUBCUTANEOUS
  Administered 2015-12-24: 3 [IU] via SUBCUTANEOUS
  Administered 2015-12-24: 5 [IU] via SUBCUTANEOUS
  Administered 2015-12-24: 2 [IU] via SUBCUTANEOUS
  Filled 2015-12-11: qty 2
  Filled 2015-12-11: qty 3
  Filled 2015-12-11: qty 2
  Filled 2015-12-11: qty 3
  Filled 2015-12-11: qty 7
  Filled 2015-12-11: qty 3
  Filled 2015-12-11: qty 5
  Filled 2015-12-11: qty 2
  Filled 2015-12-11 (×2): qty 1
  Filled 2015-12-11: qty 2
  Filled 2015-12-11: qty 3
  Filled 2015-12-11: qty 5
  Filled 2015-12-11: qty 2
  Filled 2015-12-11: qty 5
  Filled 2015-12-11 (×2): qty 2
  Filled 2015-12-11: qty 7
  Filled 2015-12-11 (×2): qty 2
  Filled 2015-12-11: qty 3
  Filled 2015-12-11: qty 1
  Filled 2015-12-11 (×2): qty 3
  Filled 2015-12-11: qty 2
  Filled 2015-12-11: qty 1
  Filled 2015-12-11: qty 5
  Filled 2015-12-11: qty 3
  Filled 2015-12-11 (×3): qty 2
  Filled 2015-12-11 (×4): qty 3
  Filled 2015-12-11: qty 1
  Filled 2015-12-11: qty 3
  Filled 2015-12-11: qty 2
  Filled 2015-12-11: qty 3
  Filled 2015-12-11 (×2): qty 2

## 2015-12-11 MED ORDER — ROSUVASTATIN CALCIUM 20 MG PO TABS
20.0000 mg | ORAL_TABLET | Freq: Every day | ORAL | Status: DC
Start: 2015-12-11 — End: 2015-12-25
  Administered 2015-12-11 – 2015-12-24 (×13): 20 mg via ORAL
  Filled 2015-12-11 (×14): qty 1

## 2015-12-11 MED ORDER — AMLODIPINE BESYLATE 10 MG PO TABS
10.0000 mg | ORAL_TABLET | Freq: Every day | ORAL | Status: DC
  Administered 2015-12-11 – 2015-12-20 (×10): 10 mg via ORAL
  Filled 2015-12-11: qty 1
  Filled 2015-12-11: qty 2
  Filled 2015-12-11: qty 1
  Filled 2015-12-11: qty 2
  Filled 2015-12-11: qty 1
  Filled 2015-12-11: qty 2
  Filled 2015-12-11 (×2): qty 1
  Filled 2015-12-11 (×3): qty 2

## 2015-12-11 MED ORDER — OXYCODONE HCL 5 MG PO TABS: 5.0000 mg | ORAL_TABLET | ORAL | Status: DC | PRN

## 2015-12-11 MED ORDER — INSULIN ASPART 100 UNIT/ML ~~LOC~~ SOLN
0.0000 [IU] | Freq: Every day | SUBCUTANEOUS | Status: DC
  Administered 2015-12-11 – 2015-12-16 (×5): 2 [IU] via SUBCUTANEOUS
  Administered 2015-12-18 (×2): 4 [IU] via SUBCUTANEOUS
  Administered 2015-12-19: 5 [IU] via SUBCUTANEOUS
  Administered 2015-12-20: 3 [IU] via SUBCUTANEOUS
  Administered 2015-12-23: 2 [IU] via SUBCUTANEOUS
  Filled 2015-12-11: qty 3
  Filled 2015-12-11 (×3): qty 2
  Filled 2015-12-11: qty 5
  Filled 2015-12-11: qty 4
  Filled 2015-12-11: qty 2

## 2015-12-11 MED ORDER — DEXTROSE 5 % IV SOLN
2.0000 g | Freq: Once | INTRAVENOUS | Status: AC
  Administered 2015-12-11: 2 g via INTRAVENOUS
  Filled 2015-12-11: qty 2

## 2015-12-11 MED ORDER — CHLORHEXIDINE GLUCONATE 0.12 % MT SOLN
15.0000 mL | Freq: Two times a day (BID) | OROMUCOSAL | Status: DC
  Administered 2015-12-11: 15 mL via OROMUCOSAL
  Filled 2015-12-11: qty 15

## 2015-12-11 MED ORDER — ACETAMINOPHEN 650 MG RE SUPP: 650.0000 mg | Freq: Four times a day (QID) | RECTAL | Status: DC | PRN

## 2015-12-11 MED ORDER — ONDANSETRON HCL 4 MG PO TABS: 4.0000 mg | ORAL_TABLET | Freq: Four times a day (QID) | ORAL | Status: DC | PRN

## 2015-12-11 MED ORDER — HEPARIN SODIUM (PORCINE) 5000 UNIT/ML IJ SOLN
5000.0000 [IU] | Freq: Three times a day (TID) | INTRAMUSCULAR | Status: DC
  Administered 2015-12-11 – 2015-12-13 (×7): 5000 [IU] via SUBCUTANEOUS
  Filled 2015-12-11 (×7): qty 1

## 2015-12-11 MED ORDER — INFLUENZA VAC SPLIT QUAD 0.5 ML IM SUSY
0.5000 mL | PREFILLED_SYRINGE | INTRAMUSCULAR | Status: DC
  Filled 2015-12-11: qty 0.5

## 2015-12-11 MED ORDER — ASPIRIN EC 81 MG PO TBEC
81.0000 mg | DELAYED_RELEASE_TABLET | Freq: Every day | ORAL | Status: DC
  Administered 2015-12-11 – 2015-12-24 (×13): 81 mg via ORAL
  Filled 2015-12-11 (×13): qty 1

## 2015-12-11 MED ORDER — INSULIN GLARGINE 100 UNIT/ML ~~LOC~~ SOLN
8.0000 [IU] | Freq: Every day | SUBCUTANEOUS | Status: DC
  Administered 2015-12-11 – 2015-12-16 (×6): 8 [IU] via SUBCUTANEOUS
  Filled 2015-12-11 (×8): qty 0.08

## 2015-12-11 MED ORDER — ACETAMINOPHEN 325 MG PO TABS: 650.0000 mg | ORAL_TABLET | Freq: Four times a day (QID) | ORAL | Status: DC | PRN

## 2015-12-11 MED ORDER — ONDANSETRON HCL 4 MG/2ML IJ SOLN
4.0000 mg | Freq: Four times a day (QID) | INTRAMUSCULAR | Status: DC | PRN
  Administered 2015-12-14 – 2015-12-17 (×4): 4 mg via INTRAVENOUS
  Filled 2015-12-11 (×5): qty 2

## 2015-12-11 MED ORDER — ORAL CARE MOUTH RINSE
15.0000 mL | Freq: Two times a day (BID) | OROMUCOSAL | Status: DC
  Administered 2015-12-11: 15 mL via OROMUCOSAL

## 2015-12-11 MED ORDER — VANCOMYCIN HCL IN DEXTROSE 1-5 GM/200ML-% IV SOLN
1000.0000 mg | Freq: Once | INTRAVENOUS | Status: AC
  Administered 2015-12-11: 1000 mg via INTRAVENOUS
  Filled 2015-12-11: qty 200

## 2015-12-11 MED ORDER — CARVEDILOL 6.25 MG PO TABS
6.2500 mg | ORAL_TABLET | Freq: Two times a day (BID) | ORAL | Status: DC
  Administered 2015-12-11 – 2015-12-19 (×18): 6.25 mg via ORAL
  Filled 2015-12-11 (×18): qty 1

## 2015-12-11 MED ORDER — CEFEPIME HCL 2 G IJ SOLR
2.0000 g | INTRAMUSCULAR | Status: DC
  Administered 2015-12-12 – 2015-12-13 (×2): 2 g via INTRAVENOUS
  Filled 2015-12-11 (×3): qty 2

## 2015-12-11 MED ORDER — OXYCODONE-ACETAMINOPHEN 5-325 MG PO TABS: 1.0000 | ORAL_TABLET | ORAL | Status: DC | PRN

## 2015-12-11 NOTE — Consult Note (Signed)
Date: 12/11/2015                  Patient Name:  Todd [name]  MRN: [id number]  DOB: October 07, 1940  Age / Sex: 75 y.o., male         PCP: Leonel Ramsay, MD                 Service Requesting Consult: Internal medicine                 Reason for Consult: CKD 5, SOB            History of Present Illness: Patient is a 75 y.o. male with medical problems of CKD st 5, long standing DM, HTN,CAD , who was admitted to [facility] on 12/10/2015 for evaluation of shortness of breath.  Patient states he had PD cathter placed at [facility] yesterday Last evening he became short of breath and decided to come to the ER.  Currently requiring HFNC Last night Bladder scan was negative CXR suggest Asymmetric Pulm edema vs pneumonia  Medications: Outpatient medications: Prescriptions Prior to Admission  Medication Sig Dispense Refill Last Dose  . amLODipine (NORVASC) 10 MG tablet Take 10 mg by mouth daily.    Past Week at Unknown time  . aspirin EC 81 MG tablet Take 1 tablet (81 mg total) by mouth daily. 1 tablet 30 Past Week at Unknown time  . carvedilol (COREG) 6.25 MG tablet Take 2 tablets (12.5 mg total) by mouth 2 (two) times daily with a meal. (Patient taking differently: Take 6.25 mg by mouth 2 (two) times daily with a meal. ) 60 tablet 0 Past Week at Unknown time  . cholecalciferol (VITAMIN D) 1000 UNITS tablet Take 1,000 Units by mouth daily.     Past Week at Unknown time  . econazole nitrate 1 % cream Apply 1 application topically daily.   Past Week at Unknown time  . glucose blood (ONE TOUCH ULTRA TEST) test strip 1 each by Other route 4 (four) times daily.    Past Week at Unknown time  . insulin glargine (LANTUS) 100 UNIT/ML injection Inject 5-8 Units into the skin at bedtime. 5 units on Sunday, Tuesday, and Thursday. 8 units on Monday, Wednesday, Friday, and Saturday.   Past Week at Unknown time  . insulin lispro (HUMALOG) 100 UNIT/ML injection Inject 5 Units into the skin daily as needed for high  blood sugar. Takes at lunch if blood sugar is over 150   Past Week at Unknown time  . ONE TOUCH ULTRA TEST test strip 1 each by Other route 4 (four) times daily.    Past Week at Unknown time  . oxyCODONE-acetaminophen (PERCOCET/ROXICET) 5-325 MG tablet Take 1-2 tablets by mouth every 4 (four) hours as needed for severe pain.   prn at prn  . rosuvastatin (CRESTOR) 20 MG tablet Take 20 mg by mouth at bedtime.    Past Week at Unknown time  . sodium bicarbonate 650 MG tablet Take 1,950 mg by mouth 3 (three) times daily.    Past Week at Unknown time  . sodium polystyrene (KAYEXALATE) 15 GM/60ML suspension Take 15 g by mouth 2 (two) times a week.    Past Week at Unknown time    Current medications: Current Facility-Administered Medications  Medication Dose Route Frequency Provider Last Rate Last Dose  . acetaminophen (TYLENOL) tablet 650 mg  650 mg Oral Q6H PRN Lance Coon, MD       Or  .  acetaminophen (TYLENOL) suppository 650 mg  650 mg Rectal Q6H PRN Lance Coon, MD      . amLODipine (NORVASC) tablet 10 mg  10 mg Oral Daily Lance Coon, MD      . aspirin EC tablet 81 mg  81 mg Oral Daily Lance Coon, MD      . carvedilol (COREG) tablet 6.25 mg  6.25 mg Oral BID WC Lance Coon, MD      . Derrill Memo ON 12/12/2015] ceFEPIme (MAXIPIME) 2 g in dextrose 5 % 50 mL IVPB  2 g Intravenous Q24H Paulette Blanch, MD      . chlorhexidine (PERIDEX) 0.12 % solution 15 mL  15 mL Mouth Rinse BID Gladstone Lighter, MD      . furosemide (LASIX) 250 mg in dextrose 5 % 250 mL (1 mg/mL) infusion  10 mg/hr Intravenous Continuous Teighan Aubert, MD      . heparin injection 5,000 Units  5,000 Units Subcutaneous Q8H Lance Coon, MD   5,000 Units at 12/11/15 0516  . [START ON 12/12/2015] Influenza vac split quadrivalent PF (FLUARIX) injection 0.5 mL  0.5 mL Intramuscular Tomorrow-1000 Lance Coon, MD      . insulin aspart (novoLOG) injection 0-5 Units  0-5 Units Subcutaneous QHS Lance Coon, MD      . insulin aspart (novoLOG)  injection 0-9 Units  0-9 Units Subcutaneous TID WC Lance Coon, MD   3 Units at 12/11/15 [phone]  . MEDLINE mouth rinse  15 mL Mouth Rinse q12n4p Gladstone Lighter, MD      . metolazone (ZAROXOLYN) tablet 10 mg  10 mg Oral Daily Harold Moncus, MD      . ondansetron (ZOFRAN) tablet 4 mg  4 mg Oral Q6H PRN Lance Coon, MD       Or  . ondansetron [facility]) injection 4 mg  4 mg Intravenous Q6H PRN Lance Coon, MD      . oxyCODONE (Oxy IR/ROXICODONE) immediate release tablet 5 mg  5 mg Oral Q4H PRN Lance Coon, MD      . oxyCODONE-acetaminophen (PERCOCET/ROXICET) 5-325 MG per tablet 1-2 tablet  1-2 tablet Oral Q4H PRN Lance Coon, MD      . rosuvastatin (CRESTOR) tablet 20 mg  20 mg Oral QHS Lance Coon, MD      . sodium bicarbonate tablet 1,950 mg  1,950 mg Oral TID Lance Coon, MD      . Derrill Memo ON 12/13/2015] sodium polystyrene (KAYEXALATE) 15 GM/60ML suspension 15 g  15 g Oral Once per day on Mon Thu Gladstone Lighter, MD          Allergies: Allergies  Allergen Reactions  . Centrum Other (See Comments)    Bloating, indigestion, chest pain  . Valsartan Other (See Comments)    Unsure of symptoms, was on list from previous hospital and patient was unsure of name of medicine.  . Vitamin D Analogs Other (See Comments)    Bloating and swelling in abdomen region, indigestion. Prescription vit d not otc      Past Medical History: Past Medical History:  Diagnosis Date  . 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 CKD (chronic kidney disease)   . Coronary artery disease   . Diabetes mellitus    lantus and novolog;fasting sugars run 70-120  . Hyperlipidemia    takes Crestor daily  . Hypertension    takes Amlodipine and Metoprolol  . Joint pain    knees,elbows,back  . PONV (postoperative nausea and vomiting)      Past  Surgical History: Past Surgical History:  Procedure Laterality Date  . BACK SURGERY  09/15/10  . CARDIAC CATHETERIZATION  01/2006   2 stents placed  . CATARACT EXTRACTION   12/21/10--02/01/11   right and left  . CHOLECYSTECTOMY  20+yrs ago  . COLONOSCOPY    . ELBOW SURGERY  2012   right elbow  . KNEE ARTHROSCOPY  80's   right knee  . TOTAL HIP ARTHROPLASTY    . ULNAR NERVE TRANSPOSITION  03/02/2011   Procedure: ULNAR NERVE DECOMPRESSION/TRANSPOSITION;  Surgeon: Peggyann Shoals, MD;  Location: [place] NEURO ORS;  Service: Neurosurgery;  Laterality: Left;  LEFT ulnar nerve decompression     Family History: Family History  Problem Relation Age of Onset  . Diabetes Mother   . Heart disease Father   . Lung cancer Father   . Anesthesia problems Neg Hx   . Hypotension Neg Hx   . Malignant hyperthermia Neg Hx   . Pseudochol deficiency Neg Hx      Social History: Social History   Social History  . Marital status: Married    Spouse name: N/A  . Number of children: N/A  . Years of education: N/A   Occupational History  . Not on file.   Social History Main Topics  . Smoking status: Never Smoker  . Smokeless tobacco: Never Used  . Alcohol use No  . Drug use: No  . Sexual activity: Yes   Other Topics Concern  . Not on file   Social History Narrative  . No narrative on file     Review of Systems: Gen:  No fever, chills HEENT: no vision c/o, decreased hearing CV: no chest pain Resp: + SOB, + cough, greenish sputum GI: no nausea, vomiting, blood in stool GU : no blood in urine, denies h/o prostate problems MS: no c/o Derm:  no c/o Psych: no c/o Heme:  No c/o Neuro: no c/o Endocrine. No c/o   Vital Signs: Blood pressure (!) 169/82, pulse (!) 107, temperature 99.5 F (37.5 C), temperature source Axillary, resp. rate (!) 21, height 5\' 9"  (1.753 m), weight 88.5 kg (195 lb), SpO2 98 %.   Intake/Output Summary (Last 24 hours) at 12/11/15 0955 Last data filed at 12/11/15 0852  Gross per 24 hour  Intake               50 ml  Output              100 ml  Net              -50 ml    Weight trends: [employer]   12/10/15 2349  Weight:  88.5 kg (195 lb)    Physical Exam: General:  NAD, laying in bed  HEENT Anicteric,   Neck:  supple  Lungs: B/l diffuse crackles and wheezing  Heart:: tachycardic   Abdomen: Soft, PD cathter in place, dressing intact  Extremities:  ++ dependent edema  Neurologic: Alert,, oriented  Skin: No acute rashes             Lab results: Basic Metabolic Panel:  Recent Labs Lab 12/10/15 2353 12/11/15 0544  NA 142 142  K 5.3* 5.3*  CL 115* 114*  CO2 16* 18*  GLUCOSE 173* 231*  BUN 71* 71*  CREATININE 4.58* 4.74*  CALCIUM 6.9* 6.9*    Liver Function Tests:  Recent Labs Lab 12/10/15 2353  AST 25  ALT 16*  ALKPHOS 70  BILITOT 0.7  PROT 6.5  ALBUMIN  3.2*    Recent Labs Lab 12/10/15 2353  LIPASE 15   No results for input(s): AMMONIA in the last 168 hours.  CBC:  Recent Labs Lab 12/10/15 2353 12/11/15 0544  WBC 9.1 9.4  HGB 10.8* 10.5*  HCT 31.7* 30.2*  MCV 94.8 94.8  PLT 182 184    Cardiac Enzymes:  Recent Labs Lab 12/10/15 2353  TROPONINI <0.03    BNP: Invalid input(s): POCBNP  CBG:  Recent Labs Lab 12/11/15 0427 12/11/15 0742  GLUCAP 248* 209*    Microbiology: No results found for this or any previous visit (from the past 720 hour(s)).   Coagulation Studies: No results for input(s): LABPROT, INR in the last 72 hours.  Urinalysis: No results for input(s): COLORURINE, LABSPEC, PHURINE, GLUCOSEU, HGBUR, BILIRUBINUR, KETONESUR, PROTEINUR, UROBILINOGEN, NITRITE, LEUKOCYTESUR in the last 72 hours.  Invalid input(s): APPERANCEUR      Imaging: Dg Chest Port 1 View  Result Date: 12/11/2015 CLINICAL DATA:  Difficulty breathing. Discharge from the [facility] today after peritoneal dialysis catheter placement. EXAM: PORTABLE CHEST 1 VIEW COMPARISON:  02/02/2015 FINDINGS: Mild cardiac enlargement without pulmonary vascular congestion. There is a left perihilar infiltrate which could indicate pneumonia or asymmetrical edema. Hilar mass lesion is not  excluded. Followup PA and lateral chest X-ray is recommended in 3-4 weeks following appropriate therapy to ensure resolution and exclude underlying malignancy. Blunting of the right costophrenic angle suggesting a small right pleural effusion. No pneumothorax. Degenerative changes in the spine. IMPRESSION: Mild cardiac enlargement. Left perihilar infiltration likely representing pneumonia. Followup PA and lateral chest X-ray is recommended in 3-4 weeks following appropriate therapy to ensure resolution and exclude underlying malignancy. Small right pleural effusion. Electronically Signed   By: Lucienne Capers M.D.   On: 12/11/2015 00:26      Assessment & Plan: Pt is a 75 y.o. yo male with medical problems of CKD st 5, long standing DM, HTN,CAD , who was admitted to [facility] on 12/10/2015 for evaluation of shortness of breath.   1. CKD st 5 2. SOB - acute pulm edema and /or pneumonia (WBC count not elevated, no fever) 3. Peripheral edema 4. DM-2 with CKD 5. HTN w/ CKD  Plan:  Trial of Lasix drip  If no response may need Hemo-dialysis Discussed with patient

## 2015-12-11 NOTE — Progress Notes (Signed)
Pharmacy Antibiotic Note  Todd [name] is a 75 y.o. male admitted on 12/10/2015 with pneumonia.  Pharmacy has been consulted for cefepime dosing.  Plan: Cefepime 2 grams q 24 hours ordered.  Height: 5\' 9"  (175.3 cm) Weight: 195 lb (88.5 kg) IBW/kg (Calculated) : 70.7  Temp (24hrs), Avg:97.8 F (36.6 C), Min:97.7 F (36.5 C), Max:97.9 F (36.6 C)   Recent Labs Lab 12/10/15 2353 12/11/15 0110  WBC 9.1  --   CREATININE 4.58*  --   LATICACIDVEN  --  1.1    Estimated Creatinine Clearance: 15.6 mL/min (by C-G formula based on SCr of 4.58 mg/dL (H)).    Allergies  Allergen Reactions  . Centrum Other (See Comments)    Bloating, indigestion, chest pain  . Valsartan Other (See Comments)    Unsure of symptoms, was on list from previous hospital and patient was unsure of name of medicine.  . Vitamin D Analogs Other (See Comments)    Bloating and swelling in abdomen region, indigestion. Prescription vit d not otc    Antimicrobials this admission: Vancomycin x1  >>  cefepime  >>   Dose adjustments this admission:   Microbiology results: 9/23 Sputum: pending    9/23 L infiltrate, likely pneumonia  Thank you for allowing pharmacy to be a part of this patient's care.  Todd [name] S 12/11/2015 4:35 AM

## 2015-12-11 NOTE — Progress Notes (Signed)
Called Dr. Jannifer Franklin to report that patient O2 sats are hovering around low 80s on high flow, that he did not tolerate the bipap earlier because he gets very anxious. Requested something for anxiety.  Order for Ativan was received. RT notified about low O2 sats. RT to come to floor to check patient.

## 2015-12-11 NOTE — Progress Notes (Signed)
Called by RN for pt in respiratory distress. Pt was previously in the ed on Bipap. Pt placed back on Bipap and waiting for transfer to ICU. Pt tol Bipap well at this time. Wife at bedside.

## 2015-12-11 NOTE — ED Notes (Addendum)
Pt breathing well with NRB, pt placed on [place] 6L.

## 2015-12-11 NOTE — H&P (Signed)
[place] [facility] [place] NAME: Todd [name]    MR#:  [id number]  DATE OF BIRTH:  12/23/1940  DATE OF ADMISSION:  12/10/2015  PRIMARY CARE PHYSICIAN: Leonel Ramsay, MD   REQUESTING/REFERRING PHYSICIAN: Beather Arbour, MD  CHIEF COMPLAINT:   Chief Complaint  Patient presents with  . Shortness of Breath    HISTORY OF PRESENT ILLNESS:  Todd [name]  is a 75 y.o. male who presents with Shortness of breath and episode of hypoxia. Patient went for catheter placement today in anticipation of starting dialysis sometime soon. Post catheter placement he had some altered mental status and was found to be hypoxic. Here in the ED he was placed on BiPAP and chest x-ray showed pneumonia. Hospitalists were called for admission and further treatment  PAST MEDICAL HISTORY:   Past Medical History:  Diagnosis Date  . 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 CKD (chronic kidney disease)   . Coronary artery disease   . Diabetes mellitus    lantus and novolog;fasting sugars run 70-120  . Hyperlipidemia    takes Crestor daily  . Hypertension    takes Amlodipine and Metoprolol  . Joint pain    knees,elbows,back  . PONV (postoperative nausea and vomiting)     PAST SURGICAL HISTORY:   Past Surgical History:  Procedure Laterality Date  . BACK SURGERY  09/15/10  . CARDIAC CATHETERIZATION  01/2006   2 stents placed  . CATARACT EXTRACTION  12/21/10--02/01/11   right and left  . CHOLECYSTECTOMY  20+yrs ago  . COLONOSCOPY    . ELBOW SURGERY  2012   right elbow  . KNEE ARTHROSCOPY  80's   right knee  . TOTAL HIP ARTHROPLASTY    . ULNAR NERVE TRANSPOSITION  03/02/2011   Procedure: ULNAR NERVE DECOMPRESSION/TRANSPOSITION;  Surgeon: Peggyann Shoals, MD;  Location: [facility];  Service: Neurosurgery;  Laterality: Left;  LEFT ulnar nerve decompression    SOCIAL HISTORY:   Social History  Substance Use Topics  . Smoking status: Never Smoker  . Smokeless  tobacco: Never Used  . Alcohol use No    FAMILY HISTORY:   Family History  Problem Relation Age of Onset  . Diabetes Mother   . Heart disease Father   . Lung cancer Father   . Anesthesia problems Neg Hx   . Hypotension Neg Hx   . Malignant hyperthermia Neg Hx   . Pseudochol deficiency Neg Hx     DRUG ALLERGIES:   Allergies  Allergen Reactions  . Centrum Other (See Comments)    Bloating, indigestion, chest pain  . Valsartan Other (See Comments)    Unsure of symptoms, was on list from previous hospital and patient was unsure of name of medicine.  . Vitamin D Analogs Other (See Comments)    Bloating and swelling in abdomen region, indigestion. Prescription vit d not otc    MEDICATIONS AT HOME:   Prior to Admission medications   Medication Sig Start Date End Date Taking? Authorizing Provider  amLODipine (NORVASC) 10 MG tablet Take 10 mg by mouth daily.  04/05/15  Yes Historical Provider, MD  aspirin EC 81 MG tablet Take 1 tablet (81 mg total) by mouth daily. 02/18/15  Yes Donzetta Starch, NP  carvedilol (COREG) 6.25 MG tablet Take 2 tablets (12.5 mg total) by mouth 2 (two) times daily with a meal. Patient taking differently: Take 6.25 mg by mouth 2 (two) times daily with a meal.  01/27/15  Yes Nicholes Mango, MD  cholecalciferol (VITAMIN D) 1000 UNITS tablet Take 1,000 Units by mouth daily.     Yes Historical Provider, MD  econazole nitrate 1 % cream Apply 1 application topically daily.   Yes Historical Provider, MD  glucose blood (ONE TOUCH ULTRA TEST) test strip 1 each by Other route 4 (four) times daily.  09/08/14  Yes Historical Provider, MD  insulin glargine (LANTUS) 100 UNIT/ML injection Inject 5-8 Units into the skin at bedtime. 5 units on Sunday, Tuesday, and Thursday. 8 units on Monday, Wednesday, Friday, and Saturday.   Yes Historical Provider, MD  insulin lispro (HUMALOG) 100 UNIT/ML injection Inject 5 Units into the skin daily as needed for high blood sugar. Takes at lunch if  blood sugar is over 150   Yes Historical Provider, MD  ONE TOUCH ULTRA TEST test strip 1 each by Other route 4 (four) times daily.  03/16/15  Yes Historical Provider, MD  oxyCODONE-acetaminophen (PERCOCET/ROXICET) 5-325 MG tablet Take 1-2 tablets by mouth every 4 (four) hours as needed for severe pain.   Yes Historical Provider, MD  rosuvastatin (CRESTOR) 20 MG tablet Take 20 mg by mouth at bedtime.  02/23/15  Yes Historical Provider, MD  sodium bicarbonate 650 MG tablet Take 1,950 mg by mouth 3 (three) times daily.    Yes Historical Provider, MD  sodium polystyrene (KAYEXALATE) 15 GM/60ML suspension Take 15 g by mouth 2 (two) times a week.  04/06/15  Yes Historical Provider, MD    REVIEW OF SYSTEMS:  Review of Systems  Constitutional: Negative for chills, fever, malaise/fatigue and weight loss.  HENT: Negative for ear pain, hearing loss and tinnitus.   Eyes: Negative for blurred vision, double vision, pain and redness.  Respiratory: Positive for shortness of breath. Negative for cough and hemoptysis.   Cardiovascular: Negative for chest pain, palpitations, orthopnea and leg swelling.  Gastrointestinal: Negative for abdominal pain, constipation, diarrhea, nausea and vomiting.  Genitourinary: Negative for dysuria, frequency and hematuria.  Musculoskeletal: Negative for back pain, joint pain and neck pain.  Skin:       No acne, rash, or lesions  Neurological: Negative for dizziness, tremors, focal weakness and weakness.  Endo/Heme/Allergies: Negative for polydipsia. Does not bruise/bleed easily.  Psychiatric/Behavioral: Negative for depression. The patient is not nervous/anxious and does not have insomnia.      VITAL SIGNS:   Vitals:   12/11/15 0030 12/11/15 0100 12/11/15 0130 12/11/15 0200  BP: 131/80 (!) 152/90 (!) 145/80 (!) 153/79  Pulse: 95 100 90 91  Resp: (!) 23 (!) 24 18 17   Temp:      TempSrc:      SpO2: 91% 95% 100% 97%  Weight:      Height:       Wt Readings from Last 3  Encounters:  12/10/15 88.5 kg (195 lb)  07/21/15 97.3 kg (214 lb 6.4 oz)  04/29/15 92.4 kg (203 lb 9.6 oz)    PHYSICAL EXAMINATION:  Physical Exam  Vitals reviewed. Constitutional: He is oriented to person, place, and time. He appears well-developed and well-nourished. No distress.  HENT:  Head: Normocephalic and atraumatic.  Mouth/Throat: Oropharynx is clear and moist.  Eyes: Conjunctivae and EOM are normal. Pupils are equal, round, and reactive to light. No scleral icterus.  Neck: Normal range of motion. Neck supple. No JVD present. No thyromegaly present.  Cardiovascular: Normal rate, regular rhythm and intact distal pulses.  Exam reveals no gallop and no friction rub.   No murmur heard.  Respiratory: Effort normal. No respiratory distress. He has no wheezes. He has no rales.  Mild coarse breath sounds  GI: Soft. Bowel sounds are normal. He exhibits no distension. There is no tenderness.  Musculoskeletal: Normal range of motion. He exhibits no edema.  No arthritis, no gout  Lymphadenopathy:    He has no cervical adenopathy.  Neurological: He is alert and oriented to person, place, and time. No cranial nerve deficit.  No dysarthria, no aphasia  Skin: Skin is warm and dry. No rash noted. No erythema.  Psychiatric: He has a normal mood and affect. His behavior is normal. Judgment and thought content normal.    LABORATORY PANEL:   CBC  Recent Labs Lab 12/10/15 2353  WBC 9.1  HGB 10.8*  HCT 31.7*  PLT 182   ------------------------------------------------------------------------------------------------------------------  Chemistries   Recent Labs Lab 12/10/15 2353  NA 142  K 5.3*  CL 115*  CO2 16*  GLUCOSE 173*  BUN 71*  CREATININE 4.58*  CALCIUM 6.9*  AST 25  ALT 16*  ALKPHOS 70  BILITOT 0.7   ------------------------------------------------------------------------------------------------------------------  Cardiac Enzymes  Recent Labs Lab  12/10/15 2353  TROPONINI <0.03   ------------------------------------------------------------------------------------------------------------------  RADIOLOGY:  Dg Chest Port 1 View  Result Date: 12/11/2015 CLINICAL DATA:  Difficulty breathing. Discharge from the [facility] today after peritoneal dialysis catheter placement. EXAM: PORTABLE CHEST 1 VIEW COMPARISON:  02/02/2015 FINDINGS: Mild cardiac enlargement without pulmonary vascular congestion. There is a left perihilar infiltrate which could indicate pneumonia or asymmetrical edema. Hilar mass lesion is not excluded. Followup PA and lateral chest X-ray is recommended in 3-4 weeks following appropriate therapy to ensure resolution and exclude underlying malignancy. Blunting of the right costophrenic angle suggesting a small right pleural effusion. No pneumothorax. Degenerative changes in the spine. IMPRESSION: Mild cardiac enlargement. Left perihilar infiltration likely representing pneumonia. Followup PA and lateral chest X-ray is recommended in 3-4 weeks following appropriate therapy to ensure resolution and exclude underlying malignancy. Small right pleural effusion. Electronically Signed   By: Lucienne Capers M.D.   On: 12/11/2015 00:26    EKG:   Orders placed or performed during the hospital encounter of 12/10/15  . ED EKG within 10 minutes  . ED EKG within 10 minutes  . EKG 12-Lead  . EKG 12-Lead    IMPRESSION AND PLAN:  Principal Problem:   CAP (community acquired pneumonia) - IV antibiotics started in the ED, duo nebs ordered when necessary, sputum culture ordered Active Problems:   ESRD (end stage renal disease) ([place]) - patient still makes some urine, had catheter placed in anticipation of dialysis but is not currently on hemodialysis. His potassium was mildly elevated ED, we will give him a small dose of Kayexalate.   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 Management plans discussed with the patient and/or family.  DVT PROPHYLAXIS: SubQ heparin  GI PROPHYLAXIS: None  ADMISSION STATUS: Inpatient  CODE STATUS: Full Code Status History    Date Active Date Inactive Code Status Order ID Comments User Context   02/01/2015  2:32 PM 02/03/2015  6:42 PM Full Code [id number]  Marliss Coots, PA-C Inpatient   01/25/2015  2:04 AM 01/27/2015  6:35 PM Full Code [id number]  Harrie Foreman, MD Inpatient  Advance Directive Documentation   Flowsheet Row Most Recent Value  Type of Advance Directive  Healthcare Power of Attorney, Living will  Pre-existing out of facility DNR order (yellow form or pink MOST form)  No data  "MOST" Form in Place?  No data      TOTAL TIME TAKING CARE OF THIS PATIENT: 45 minutes.    Ceira Hoeschen [place] 12/11/2015, 2:22 AM  [facility]  Office  [phone]  CC: Primary care physician; Leonel Ramsay, MD

## 2015-12-11 NOTE — Progress Notes (Addendum)
Per Dr. Stevenson Clinch, pt's o2 sat goal is >/= 88%. Pt currently at 93% on HFNC. Per MD order will provide IS and education on how to use.

## 2015-12-11 NOTE — ED Notes (Signed)
Report given to floor RN. Pt taken to floor via stretcher. Vital signs stable prior to transport.  

## 2015-12-11 NOTE — Progress Notes (Signed)
Pt feels slightly anxious wearing the BiPap mask. Dr. Tressia Miners in room and is okay with trying pt on high flow [place] to see if he tolerates that and is more comfortable.

## 2015-12-11 NOTE — Progress Notes (Signed)
[place] [facility] [place] NAME: Todd [name]    MR#:  [id number]  DATE OF BIRTH:  1940-03-21  SUBJECTIVE:  CHIEF COMPLAINT:   Chief Complaint  Patient presents with  . Shortness of Breath   - Admitted with shortness of breath and diagnosed to have pneumonia. Remains on BiPAP this morning. - Appears very short of breath. Denies any chest pain. Has some abdominal discomfort.  REVIEW OF SYSTEMS:  Review of Systems  Constitutional: Negative for chills, fever and malaise/fatigue.  HENT: Negative for ear discharge and ear pain.   Eyes: Negative for blurred vision and double vision.  Respiratory: Positive for shortness of breath. Negative for cough and wheezing.   Cardiovascular: Positive for leg swelling. Negative for chest pain and palpitations.  Gastrointestinal: Negative for abdominal pain, constipation, diarrhea, nausea and vomiting.  Genitourinary: Negative for dysuria and urgency.  Musculoskeletal: Negative for myalgias.  Neurological: Negative for dizziness, sensory change, speech change, focal weakness, seizures and headaches.  Psychiatric/Behavioral: Negative for depression.    DRUG ALLERGIES:   Allergies  Allergen Reactions  . Centrum Other (See Comments)    Bloating, indigestion, chest pain  . Valsartan Other (See Comments)    Unsure of symptoms, was on list from previous hospital and patient was unsure of name of medicine.  . Vitamin D Analogs Other (See Comments)    Bloating and swelling in abdomen region, indigestion. Prescription vit d not otc    VITALS:  Blood pressure (!) 169/82, pulse (!) 107, temperature 99.5 F (37.5 C), temperature source Axillary, resp. rate (!) 21, height 5\' 9"  (1.753 m), weight 88.5 kg (195 lb), SpO2 98 %.  PHYSICAL EXAMINATION:  Physical Exam  GENERAL:  75 y.o.-year-old patient lying in the bed with no acute distress. Ill-appearing, on BiPAP. Appears tachypneic EYES: Pupils equal, round,  reactive to light and accommodation. No scleral icterus. Extraocular muscles intact.  HEENT: Head atraumatic, normocephalic. Oropharynx and nasopharynx clear.  NECK:  Supple, no jugular venous distention. No thyroid enlargement, no tenderness.  LUNGS: Normal breath sounds bilaterally, no wheezing, rales,rhonchi or crepitation. No use of accessory muscles of respiration. Decreased bibasilar breath sounds noted CARDIOVASCULAR: S1, S2 normal. No murmurs, rubs, or gallops.  ABDOMEN: Soft, nontender but discomfort on palpation without guarding or rigidity, nondistended. Bowel sounds present. No organomegaly or mass. Peritoneal dialysis catheter present EXTREMITIES: No pedal edema, cyanosis, or clubbing.  NEUROLOGIC: Cranial nerves II through XII are intact. Muscle strength 5/5 in all extremities. Sensation intact. Gait not checked.  PSYCHIATRIC: The patient is alert and oriented x 3.  SKIN: No obvious rash, lesion, or ulcer.    LABORATORY PANEL:   CBC  Recent Labs Lab 12/11/15 0544  WBC 9.4  HGB 10.5*  HCT 30.2*  PLT 184   ------------------------------------------------------------------------------------------------------------------  Chemistries   Recent Labs Lab 12/10/15 2353 12/11/15 0544  NA 142 142  K 5.3* 5.3*  CL 115* 114*  CO2 16* 18*  GLUCOSE 173* 231*  BUN 71* 71*  CREATININE 4.58* 4.74*  CALCIUM 6.9* 6.9*  AST 25  --   ALT 16*  --   ALKPHOS 70  --   BILITOT 0.7  --    ------------------------------------------------------------------------------------------------------------------  Cardiac Enzymes  Recent Labs Lab 12/10/15 2353  TROPONINI <0.03   ------------------------------------------------------------------------------------------------------------------  RADIOLOGY:  Dg Chest Port 1 View  Result Date: 12/11/2015 CLINICAL DATA:  Difficulty breathing. Discharge from the [facility] today after peritoneal dialysis catheter placement. EXAM: PORTABLE CHEST 1  VIEW  COMPARISON:  02/02/2015 FINDINGS: Mild cardiac enlargement without pulmonary vascular congestion. There is a left perihilar infiltrate which could indicate pneumonia or asymmetrical edema. Hilar mass lesion is not excluded. Followup PA and lateral chest X-ray is recommended in 3-4 weeks following appropriate therapy to ensure resolution and exclude underlying malignancy. Blunting of the right costophrenic angle suggesting a small right pleural effusion. No pneumothorax. Degenerative changes in the spine. IMPRESSION: Mild cardiac enlargement. Left perihilar infiltration likely representing pneumonia. Followup PA and lateral chest X-ray is recommended in 3-4 weeks following appropriate therapy to ensure resolution and exclude underlying malignancy. Small right pleural effusion. Electronically Signed   By: Lucienne Capers M.D.   On: 12/11/2015 00:26    EKG:   Orders placed or performed during the hospital encounter of 12/10/15  . ED EKG within 10 minutes  . ED EKG within 10 minutes  . EKG 12-Lead  . 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  #1 acute hypoxic respiratory failure-secondary to pneumonia and pulmonary edema -Change from BiPAP to high flow nasal cannula. Continue to monitor and ICU stepdown -Nephrology has been consulted to see dialysis need to be started sooner -Blood cultures are pending. 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 Has peritoneal dialysis catheter. -Nephrology consulted. If no improvement in  breathing symptoms, might need to consider dialysis this admission.  #4 hypertension-continue Norvasc and diuretics  #5 DM- on SSI and low dose lantus  #6 DVT Prophylaxis- SQ heparin     All the records are reviewed and case discussed with Care Management/Social Workerr. Management plans discussed with the patient, family and they are in agreement.  CODE STATUS: Full code  TOTAL CRITICAL CARE TIME SPENT IN TAKING CARE OF THIS PATIENT: 40 minutes.   POSSIBLE D/C IN 2-3 DAYS, DEPENDING ON CLINICAL CONDITION.   Gladstone Lighter M.D on 12/11/2015 at 12:22 PM  Between 7am to 6pm - Pager - [phone]  After 6pm go to [email or web address] - password [email or web address] [facility]  Office  [phone]  CC: Primary care physician; Leonel Ramsay, MD

## 2015-12-11 NOTE — ED Notes (Signed)
Pt maintaining o2sat 91-94% on 6L [place], Dr. Marcille Blanco informed and admission orders changed. Per Dr. Marcille Blanco, leave pt on [place].  Awaiting new bed assignment

## 2015-12-11 NOTE — ED Notes (Signed)
Pt reports has not urinated in 13 hours, pt bladder scanned and nothing found

## 2015-12-11 NOTE — ED Notes (Signed)
Per Dr. Jannifer Franklin' request, bipap removed and pt placed on NRB, will continue to monitor

## 2015-12-11 NOTE — Progress Notes (Signed)
Patient complaining of difficulty breathing. More congested compared to initial assessment. Placed on non-rebreather. Oxygen saturation 85 percent. MD notified. Respiratory notified. MD assessed patient and new orders placed. Wife updated at bedside. Verbalized understanding.

## 2015-12-11 NOTE — Progress Notes (Signed)
Gave IS to pt and educated on how to use. He return demonstration of proper use.

## 2015-12-11 NOTE — Progress Notes (Addendum)
New Transfer Note  Arrival Method: Bed accompanied by 2 RNs and RT Mental Orientation: A&O x 4 Telemetry: Pt placed on monitor. Central tele and Elink aware of pt's transfer Assessment: Completed Skin: Pt has laprascopic surgical incisions to LLQ. Peritoneal dialysis catheter placed yesterday at [facility] IV: L Hand 22G, flushes easily, no pain, no blood return Pain: Moderate pain 5/10 to surgical incisions, pt refusing pain medication at this time. Environmental changes completed to facilitate rest and relaxation.  Safety Measures: Bed alarm obn, 3/4 bed rails up.  Unit Orientation: Pt and family oriented to room, has received patient guide, and taught how to use call ball.  Family: Wife at bedside with pt.   VSS: BP (!) 169/82 (BP Location: Left Arm)   Pulse (!) 107   Temp 99.5 F (37.5 C) (Axillary)   Resp (!) 21   Ht 5\' 9"  (1.753 m)   Wt 88.5 kg (195 lb)   SpO2 98%   BMI 28.80 kg/m

## 2015-12-11 NOTE — Progress Notes (Signed)
Performed bladder scan. 655 mLs of urine in bladder. Dr. Candiss Norse notified, per order will place foley for 24 hours.

## 2015-12-11 NOTE — Progress Notes (Signed)
Patient transferred from room 246 to ICU bed 9 on Bipap 14/8 and [id number] with no complications. Patient tol well at this time.

## 2015-12-12 ENCOUNTER — Encounter: Payer: Self-pay | Admitting: Adult Health

## 2015-12-12 DIAGNOSIS — J9691 Respiratory failure, unspecified with hypoxia: Secondary | ICD-10-CM

## 2015-12-12 DIAGNOSIS — R0902 Hypoxemia: Secondary | ICD-10-CM

## 2015-12-12 DIAGNOSIS — I10 Essential (primary) hypertension: Secondary | ICD-10-CM

## 2015-12-12 DIAGNOSIS — J9601 Acute respiratory failure with hypoxia: Secondary | ICD-10-CM

## 2015-12-12 DIAGNOSIS — N186 End stage renal disease: Secondary | ICD-10-CM

## 2015-12-12 LAB — BASIC METABOLIC PANEL
Anion gap: 8 (ref 5–15)
BUN: 72 mg/dL — ABNORMAL HIGH (ref 6–20)
CHLORIDE: 111 mmol/L (ref 101–111)
CO2: 23 mmol/L (ref 22–32)
Calcium: 7.5 mg/dL — ABNORMAL LOW (ref 8.9–10.3)
Creatinine, Ser: 5.24 mg/dL — ABNORMAL HIGH (ref 0.61–1.24)
GFR calc non Af Amer: 10 mL/min — ABNORMAL LOW (ref >60)
GFR, EST AFRICAN AMERICAN: 11 mL/min — AB (ref >60)
Glucose, Bld: 182 mg/dL — ABNORMAL HIGH (ref 65–99)
POTASSIUM: 4.8 mmol/L (ref 3.5–5.1)
SODIUM: 142 mmol/L (ref 135–145)

## 2015-12-12 LAB — BLOOD GAS, ARTERIAL
ACID-BASE DEFICIT: 9.6 mmol/L — AB (ref 0.0–2.0)
BICARBONATE: 17.6 mmol/L — AB (ref 20.0–28.0)
FIO2: 1
PATIENT TEMPERATURE: 37
PCO2 ART: 43 mmHg (ref 32.0–48.0)
PH ART: 7.22 — AB (ref 7.350–7.450)
SAMPLE TYPE: POSITIVE
pO2, Arterial: <31 mmHg — CL (ref 83.0–108.0)

## 2015-12-12 LAB — GLUCOSE, CAPILLARY
GLUCOSE-CAPILLARY: 168 mg/dL — AB (ref 65–99)
GLUCOSE-CAPILLARY: 227 mg/dL — AB (ref 65–99)
Glucose-Capillary: 219 mg/dL — ABNORMAL HIGH (ref 65–99)
Glucose-Capillary: 203 mg/dL — ABNORMAL HIGH (ref 65–99)

## 2015-12-12 MED ORDER — MORPHINE SULFATE (PF) 2 MG/ML IV SOLN
1.0000 mg | Freq: Once | INTRAVENOUS | Status: AC
  Administered 2015-12-12: 1 mg via INTRAVENOUS
  Filled 2015-12-12: qty 1

## 2015-12-12 MED ORDER — SALINE SPRAY 0.65 % NA SOLN
1.0000 | NASAL | Status: DC | PRN
  Administered 2015-12-12: 1 via NASAL
  Filled 2015-12-12: qty 44

## 2015-12-12 NOTE — Progress Notes (Signed)
[place] [facility] [place] NAME: Todd [name]    MR#:  [id number]  DATE OF BIRTH:  1940-09-06  SUBJECTIVE:  CHIEF COMPLAINT:   Chief Complaint  Patient presents with  . Shortness of Breath   - appears stronger than yesterday. Less tachypneic. Remains on high flow nasal cannula to at 50 L flow rate and 100% FiO2 -on Lasix drip and voided almost 5 L over 24 hours  REVIEW OF SYSTEMS:  Review of Systems  Constitutional: Negative for chills, fever and malaise/fatigue.  HENT: Negative for ear discharge and ear pain.   Eyes: Negative for blurred vision and double vision.  Respiratory: Positive for shortness of breath. Negative for cough and wheezing.   Cardiovascular: Positive for leg swelling. Negative for chest pain and palpitations.  Gastrointestinal: Negative for abdominal pain, constipation, diarrhea, nausea and vomiting.  Genitourinary: Negative for dysuria and urgency.  Musculoskeletal: Negative for myalgias.  Neurological: Negative for dizziness, sensory change, speech change, focal weakness, seizures and headaches.  Psychiatric/Behavioral: Negative for depression.    DRUG ALLERGIES:   Allergies  Allergen Reactions  . Centrum Other (See Comments)    Bloating, indigestion, chest pain  . Valsartan Other (See Comments)    Unsure of symptoms, was on list from previous hospital and patient was unsure of name of medicine.  . Vitamin D Analogs Other (See Comments)    Bloating and swelling in abdomen region, indigestion. Prescription vit d not otc    VITALS:  Blood pressure (!) 147/63, pulse 78, temperature 98.6 F (37 C), temperature source Oral, resp. rate 20, height 5\' 9"  (1.753 m), weight 90.6 kg (199 lb 11.8 oz), SpO2 92 %.  PHYSICAL EXAMINATION:  Physical Exam  GENERAL:  75 y.o.-year-old patient lying in the bed with no acute distress. On high flow nasal cannula, not tachypneic today EYES: Pupils equal, round, reactive to light  and accommodation. No scleral icterus. Extraocular muscles intact.  HEENT: Head atraumatic, normocephalic. Oropharynx and nasopharynx clear.  NECK:  Supple, no jugular venous distention. No thyroid enlargement, no tenderness.  LUNGS: Normal breath sounds bilaterally, no wheezing, rales,rhonchi or crepitation. No use of accessory muscles of respiration. Decreased bibasilar breath sounds noted CARDIOVASCULAR: S1, S2 normal. No murmurs, rubs, or gallops.  ABDOMEN: Soft, nontender, nondistended. Bowel sounds present. No organomegaly or mass. Peritoneal dialysis catheter present EXTREMITIES: No pedal edema, cyanosis, or clubbing.  NEUROLOGIC: Cranial nerves II through XII are intact. Muscle strength 5/5 in all extremities. Sensation intact. Gait not checked.  PSYCHIATRIC: The patient is alert and oriented x 3.  SKIN: No obvious rash, lesion, or ulcer.    LABORATORY PANEL:   CBC  Recent Labs Lab 12/11/15 0544  WBC 9.4  HGB 10.5*  HCT 30.2*  PLT 184   ------------------------------------------------------------------------------------------------------------------  Chemistries   Recent Labs Lab 12/10/15 2353  12/12/15 0518  NA 142  < > 142  K 5.3*  < > 4.8  CL 115*  < > 111  CO2 16*  < > 23  GLUCOSE 173*  < > 182*  BUN 71*  < > 72*  CREATININE 4.58*  < > 5.24*  CALCIUM 6.9*  < > 7.5*  AST 25  --   --   ALT 16*  --   --   ALKPHOS 70  --   --   BILITOT 0.7  --   --   < > = values in this interval not displayed. ------------------------------------------------------------------------------------------------------------------  Cardiac Enzymes  Recent  Labs Lab 12/10/15 2353  TROPONINI <0.03   ------------------------------------------------------------------------------------------------------------------  RADIOLOGY:  Dg Chest Port 1 View  Result Date: 12/11/2015 CLINICAL DATA:  Difficulty breathing. Discharge from the [facility] today after peritoneal dialysis catheter  placement. EXAM: PORTABLE CHEST 1 VIEW COMPARISON:  02/02/2015 FINDINGS: Mild cardiac enlargement without pulmonary vascular congestion. There is a left perihilar infiltrate which could indicate pneumonia or asymmetrical edema. Hilar mass lesion is not excluded. Followup PA and lateral chest X-ray is recommended in 3-4 weeks following appropriate therapy to ensure resolution and exclude underlying malignancy. Blunting of the right costophrenic angle suggesting a small right pleural effusion. No pneumothorax. Degenerative changes in the spine. IMPRESSION: Mild cardiac enlargement. Left perihilar infiltration likely representing pneumonia. Followup PA and lateral chest X-ray is recommended in 3-4 weeks following appropriate therapy to ensure resolution and exclude underlying malignancy. Small right pleural effusion. Electronically Signed   By: Lucienne Capers M.D.   On: 12/11/2015 00:26    EKG:   Orders placed or performed during the hospital encounter of 12/10/15  . ED EKG within 10 minutes  . ED EKG within 10 minutes  . EKG 12-Lead  . 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  #1 acute hypoxic respiratory failure-secondary to pneumonia and pulmonary edema -remains on high flow nasal cannula. Still at 100% fio2, 50L flow rate, Continue to monitor in ICU stepdown - clinically improving, encourage incentive spirometry -Blood cultures are not done on admission. Continue broad-spectrum antibiotics with cefepime -If does not improve, consider CT chest or nuclear medicine VQ scan - f/u CXR  #2 hyperkalemia-has chronic hyperkalemia -Continue Kayexalate twice weekly - improved while on lasix drip. Avoid ACEI and ARB  #3 CKD stage  V-progressed to end-stage renal disease. Has peritoneal dialysis catheter. Plan was to start dialysis in a month after teaching is finished with PD catheter -Nephrology consulted.  - started on lasix drip, voiding well, though cr in worsened - no acute indication to start dialysis now  #4 hypertension-continue Norvasc and diuretics  #5 DM- on SSI and low dose lantus  #6 DVT Prophylaxis- SQ heparin   Physical therapy consult, once off of high flow.   All the records are reviewed and case discussed with Care Management/Social Workerr. Management plans discussed with the patient, family and they are in agreement.  CODE STATUS: Full code  TOTAL CRITICAL CARE TIME SPENT IN TAKING CARE OF THIS PATIENT: 40 minutes.   POSSIBLE D/C IN 2-3 DAYS, DEPENDING ON CLINICAL CONDITION.   Gladstone Lighter M.D on 12/12/2015 at 9:59 AM  Between 7am to 6pm - Pager - [phone]  After 6pm go to [email or web address] - password [email or web address] [place]  Office  [phone]  CC: Primary care physician; Leonel Ramsay, MD

## 2015-12-12 NOTE — Progress Notes (Signed)
Pt was able to maintain sats enough to not require bipap, remains on high flow this AM.

## 2015-12-12 NOTE — Progress Notes (Signed)
Subjective:   Feels better overall Urine output over 5 L with IV Lasix drip; another 2 L obtained this morning Able to breathe better Still remains on nasal high flow oxygen   Objective:  Vital signs in last 24 hours:  Temp:  [98.2 F (36.8 C)-98.9 F (37.2 C)] 98.6 F (37 C) (09/24 0800) Pulse Rate:  [78-101] 81 (09/24 1100) Resp:  [18-31] 20 (09/24 0800) BP: (133-165)/(58-93) 147/Todd (09/24 0800) SpO2:  [85 %-98 %] 98 % (09/24 1100) FiO2 (%):  [96 %-100 %] 100 % (09/24 0800) Weight:  [90.6 kg (199 lb 11.8 oz)] 90.6 kg (199 lb 11.8 oz) (09/24 0519)  Weight change: 2.149 kg (4 lb 11.8 oz) Filed Weights   12/10/15 2349 12/12/15 0519  Weight: 88.5 kg (195 lb) 90.6 kg (199 lb 11.8 oz)    Intake/Output:    Intake/Output Summary (Last 24 hours) at 12/12/15 1154 Last data filed at 12/12/15 1000  Gross per 24 hour  Intake              240 ml  Output             6175 ml  Net            -5935 ml     Physical Exam: General: No acute distress, sitting in the bed   HEENT Anicteric, moist oral mucous membranes   Neck Supple   Pulm/lungs Crackles at bases bilaterally   CVS/Heart No rub or gallop   Abdomen:  Soft, nontender, nondistended, PD catheter in place   Extremities: ++ Dependent edema   Neurologic: Alert, oriented   Skin: No acute rashes    Foley in place        Basic Metabolic Panel:   Recent Labs Lab 12/10/15 2353 12/11/15 0544 12/12/15 0518  NA 142 142 142  K 5.3* 5.3* 4.8  CL 115* 114* 111  CO2 16* 18* 23  GLUCOSE 173* 231* 182*  BUN 71* 71* 72*  CREATININE 4.58* 4.74* 5.24*  CALCIUM 6.9* 6.9* 7.5*     CBC:  Recent Labs Lab 12/10/15 2353 12/11/15 0544  WBC 9.1 9.4  HGB 10.8* 10.5*  HCT 31.7* 30.2*  MCV 94.8 94.8  PLT 182 184      Microbiology:  Recent Results (from the past 720 hour(s))  MRSA PCR Screening     Status: None   Collection Time: 12/11/15 11:47 AM  Result Value Ref Range Status   MRSA by PCR NEGATIVE NEGATIVE Final     Comment:        The GeneXpert MRSA Assay (FDA approved for NASAL specimens only), is one component of a comprehensive MRSA colonization surveillance program. It is not intended to diagnose MRSA infection nor to guide or monitor treatment for MRSA infections.     Coagulation Studies: No results for input(s): LABPROT, INR in the last 72 hours.  Urinalysis: No results for input(s): COLORURINE, LABSPEC, PHURINE, GLUCOSEU, HGBUR, BILIRUBINUR, KETONESUR, PROTEINUR, UROBILINOGEN, NITRITE, LEUKOCYTESUR in the last 72 hours.  Invalid input(s): APPERANCEUR    Imaging: Dg Chest Port 1 View  Result Date: 12/11/2015 CLINICAL DATA:  Difficulty breathing. Discharge from the [facility] today after peritoneal dialysis catheter placement. EXAM: PORTABLE CHEST 1 VIEW COMPARISON:  02/02/2015 FINDINGS: Mild cardiac enlargement without pulmonary vascular congestion. There is a left perihilar infiltrate which could indicate pneumonia or asymmetrical edema. Hilar mass lesion is not excluded. Followup PA and lateral chest X-ray is recommended in 3-4 weeks following appropriate therapy to ensure resolution and  exclude underlying malignancy. Blunting of the right costophrenic angle suggesting a small right pleural effusion. No pneumothorax. Degenerative changes in the spine. IMPRESSION: Mild cardiac enlargement. Left perihilar infiltration likely representing pneumonia. Followup PA and lateral chest X-ray is recommended in 3-4 weeks following appropriate therapy to ensure resolution and exclude underlying malignancy. Small right pleural effusion. Electronically Signed   By: Lucienne Capers M.D.   On: 12/11/2015 00:26     Medications:   . furosemide (LASIX) infusion 8 mg/hr (12/12/15 1041)   . amLODipine  10 mg Oral Daily  . aspirin EC  81 mg Oral Daily  . carvedilol  6.25 mg Oral BID WC  . ceFEPime (MAXIPIME) IV  2 g Intravenous Q24H  . heparin  5,000 Units Subcutaneous Q8H  . Influenza vac split  quadrivalent PF  0.5 mL Intramuscular Tomorrow-1000  . insulin aspart  0-5 Units Subcutaneous QHS  . insulin aspart  0-9 Units Subcutaneous TID WC  . insulin glargine  8 Units Subcutaneous QHS  . mouth rinse  15 mL Mouth Rinse BID  . metolazone  10 mg Oral Daily  . rosuvastatin  20 mg Oral QHS  . sodium bicarbonate  1,950 mg Oral TID  . [START ON 12/13/2015] sodium polystyrene  15 g Oral Once per day on Mon Thu   acetaminophen **OR** acetaminophen, LORazepam, ondansetron **OR** ondansetron (ZOFRAN) IV, oxyCODONE, oxyCODONE-acetaminophen  Assessment/ Plan:  75 y.o.Todd [name] with medical problems of CKD st 5 followed by [place] [facility] nephrology, long standing DM, HTN,CAD , who was admitted to [facility] on 12/10/2015 for evaluation of shortness of breath.   1. CKD st 5 2. SOB - acute pulm edema and /or pneumonia (WBC count not elevated, no fever) 3. Peripheral edema 4. DM-2 with CKD 5. HTN w/ CKD  Patient had good response to intravenous Lasix infusion at 10 mg per hour and Metolazone Over 7 L of urine has been obtained in the last 24 hours However, BUN/creatinine have worsened Blood pressure and hemodynamics are stable  Plan: Continue Lasix drip and slightly reduced rate of 8 mg/h to optimize volume status Agent is still needing oxygen supplementation with high flow nasal cannula Normally, he does not use any oxygen at home Once volume status is optimized, diuretic regimen can be reevaluated       LOS: 1 [name] 9/24/201711:54 AM

## 2015-12-12 NOTE — Consult Note (Signed)
PULMONARY / CRITICAL CARE MEDICINE   Name: Todd [name] MRN: [id number] DOB: 1940-12-24    ADMISSION DATE:  12/10/2015   CONSULTATION DATE: 12/12/2015  REFERRING MD:  Dr. Candiss Norse  CHIEF COMPLAINT: Shortness of breath and hypoxia s/p peritoneal dialysis catheter placement  HISTORY OF PRESENT ILLNESS:   This is a 75 year old male who presents who presented with acute shortness of breath, status posts peritoneotomy dialysis catheter placement at [facility]. History is obtained from patient, patient's wife and from ED records. Patient's wife stated that patient was discharged from [facility] after he had his peritoneal dialysis catheter placed. When he got home he suddenly developed shortness of breath at night. Symptoms got worse, hence they called EMS. He denies any associated chest pain or palpitations. Dyspnea is aggravated by exertion and relieved by rest. He denies taking any remedies except for an Percocet for pain prior to ED arrival. Prior to going in for the procedure, patient's wife states that she not the least that he had increased edema in his lower extremities and upper arms. Upon presentation in the ED, patient was found to be hypoxic with oxygen saturation in the low 80s and was placed on 6 L of oxygen via nasal cannula. He remained hypoxic and was later transitioned to a nonrebreather mask and then to high flow nasal cannula with intermittent BiPAP. Initially, patient's symptoms improved but he still gets severely dyspneic with minimal exertion. He remains mildly tachycardic as well. His x-ray showed cardiomegaly with questionable left perihilar infiltrate and a right small pleural effusion. Patient was being treated on the floor when on 12/11/2015 morning he developed acute respiratory distress, necessitating continuous BiPAP and hence he was transferred to the ICU. PCM was consulted due to persistent hypoxia despite treatment. Patient is currently on BiPAP and reports mild improvement in symptoms.  He denies chest pain, palpitations, nausea, vomiting, dizziness, diarrhea, but reports mild pain at the peritoneal dialysis catheter insertion site and persistent shortness of breath with minimal exertion. 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  PAST SURGICAL HISTORY: He  has a past surgical history that includes Knee arthroscopy (80's); Cholecystectomy (20+yrs ago); Cardiac catheterization (01/2006); Back surgery (09/15/10); Cataract extraction (12/21/10--02/01/11); Elbow surgery (2012); Colonoscopy; Ulnar nerve transposition (03/02/2011); and Total hip arthroplasty.  Allergies  Allergen Reactions  . Centrum Other (See Comments)    Bloating, indigestion, chest pain  . Valsartan Other (See Comments)    Unsure of symptoms, was on list from previous hospital and patient was unsure of name of medicine.  . Vitamin D Analogs Other (See Comments)    Bloating and swelling in abdomen region, indigestion. Prescription vit d not otc    No current facility-administered medications on file prior to encounter.    Current Outpatient Prescriptions on File Prior to Encounter  Medication Sig  . amLODipine (NORVASC) 10 MG tablet Take 10 mg by mouth daily.   Marland Kitchen aspirin EC 81 MG tablet Take 1 tablet (81 mg total) by mouth daily.  . carvedilol (COREG) 6.25 MG tablet Take 2 tablets (12.5 mg total) by mouth 2 (two) times daily with a meal. (Patient taking differently: Take 6.25 mg by mouth 2 (two) times daily with a meal. )  . cholecalciferol (VITAMIN D) 1000 UNITS tablet Take 1,000 Units  by mouth daily.    Marland Kitchen glucose blood (ONE TOUCH ULTRA TEST) test strip 1 each by Other route 4 (four) times daily.   . insulin glargine (LANTUS) 100 UNIT/ML injection Inject 5-8 Units  into the skin at bedtime. 5 units on Sunday, Tuesday, and Thursday. 8 units on Monday, Wednesday, Friday, and Saturday.  . insulin lispro (HUMALOG) 100 UNIT/ML injection Inject 5 Units into the skin daily as needed for high blood sugar. Takes at lunch if blood sugar is over 150  . ONE TOUCH ULTRA TEST test strip 1 each by Other route 4 (four) times daily.   . rosuvastatin (CRESTOR) 20 MG tablet Take 20 mg by mouth at bedtime.   . sodium bicarbonate 650 MG tablet Take 1,950 mg by mouth 3 (three) times daily.   . sodium polystyrene (KAYEXALATE) 15 GM/60ML suspension Take 15 g by mouth 2 (two) times a week.     FAMILY HISTORY:  His indicated that his mother is deceased. He indicated that his father is deceased. He indicated that the status of his neg hx is unknown.    SOCIAL HISTORY: He  reports that he has never smoked. He has never used smokeless tobacco. He reports that he does not drink alcohol or use drugs.  REVIEW OF SYSTEMS:  Limited as patient is on BiPAP. Top Constitutional: Negative for fever and chills.  HENT: Negative for congestion and rhinorrhea.  Eyes: Negative for redness and visual disturbance.  Respiratory: Positive for for shortness of breath, productive cough, negative for wheezing.  Cardiovascular: Negative for chest pain and palpitations.  Gastrointestinal: Negative  for nausea , vomiting and abdominal pain and loose stools Genitourinary: Negative for dysuria and urgency.  Endocrine: Denies polyuria, polyphagia and heat intolerance Musculoskeletal: Negative for myalgias and arthralgias.  Skin: Negative for pallor and wound.  Neurological: Negative for dizziness and headaches   SUBJECTIVE:   VITAL SIGNS: BP (!) 143/73   Pulse 74   Temp 98.1 F (36.7 C) (Oral)   Resp 16   Ht 5\' 9"  (1.753 m)   Wt 199 lb 11.8 oz (90.6 kg)   SpO2 96%   BMI 29.50 kg/m   HEMODYNAMICS:    VENTILATOR SETTINGS: FiO2 (%):  [96 %-100 %] 100 %  INTAKE / OUTPUT: I/O last 3  completed shifts: In: 599.2 [P.O.:240; I.V.:309.2; IV Piggyback:50] Out: 8825 [Urine:8825]  PHYSICAL EXAMINATION: General:  Chronically ill-looking, mild respiratory distress  Neuro: Alert to person, place and time, following commands, speech is clear, moves all extremities, no focal deficits  HEENT:  Is normocephalic and atraumatic, EMS and nasal passages, hypertension, pupils are equal and reactive to light with good accommodation, trachea midline, neck is supple with good range of motion, tongue protrudes at midline, oral mucosa moist and pink, gag reflex intact  Cardiovascular: Apical pulse mildly tachycardic, S1, S2 audible, no murmur, regurg or gallop  Lungs:  Mild to moderate increase in work of breathing, mild use of as abdominal muscles, bilateral airflow, breath sounds diminished in the bases, right greater than left, no expiratory true inspiratory wheezes, scattered rhonchi in anterior lung fields  Abdomen:Obese, normal bowel sounds, palpation reveals no organomegaly   Musculoskeletal:  Positive range of motion in upper and lower extremities, no visible joint deformities Extremities: Venostasis discoloration in bilateral lower extremities, +2 pulses bilaterally  Skin: Warm and dry, venostasis changes as described above, no rash or lesions  LABS:  BMET  Recent Labs Lab 12/10/15 2353 12/11/15 0544 12/12/15 0518  NA 142 142  142  K 5.3* 5.3* 4.8  CL 115* 114* 111  CO2 16* 18* 23  BUN 71* 71* 72*  CREATININE 4.58* 4.74* 5.24*  GLUCOSE 173* 231* 182*    Electrolytes  Recent Labs Lab 12/10/15 2353 12/11/15 0544 12/12/15 0518  CALCIUM 6.9* 6.9* 7.5*    CBC  Recent Labs Lab 12/10/15 2353 12/11/15 0544  WBC 9.1 9.4  HGB 10.8* 10.5*  HCT 31.7* 30.2*  PLT 182 184    Coag's No results for input(s): APTT, INR in the last 168 hours.  Sepsis Markers  Recent Labs Lab 12/11/15 0110  LATICACIDVEN 1.1    ABG  Recent Labs Lab 12/11/15 0024  PHART 7.22*   PCO2ART 43  PO2ART <31.0*    Liver Enzymes  Recent Labs Lab 12/10/15 2353  AST 25  ALT 16*  ALKPHOS 70  BILITOT 0.7  ALBUMIN 3.2*    Cardiac Enzymes  Recent Labs Lab 12/10/15 2353  TROPONINI <0.03    Glucose  Recent Labs Lab 12/11/15 1626 12/11/15 2137 12/12/15 0750 12/12/15 1130 12/12/15 1556 12/12/15 2106  GLUCAP 167* 195* 168* 219* 227* 203*    Imaging No results found.   STUDIES:  2-D echo on that 12/12/2015 VQ scan on that 12/12/2015  CULTURES: MRSA screen negative   ANTIBIOTICS: Cefepime 12/11/2015   SIGNIFICANT EVENTS: 12/10/2015: Peritoneal dialysis catheter placed at [facility]; later that evening, patient presents in the ED with hypoxia and dyspnea; admitted with community-acquired pneumonia, MI: Developed worsening respiratory distress and was transferred to the ICU on 12/11/2015. Stop  LINES/TUBES: Peripheral IVs   DISCUSSION: 75 year old male with a history of hypertension, type 2 diabetes, coronary artery disease, chronic kidney disease, hyperlipidemia, and joint pain presenting with acute hypoxic respiratory failure, status post peritoneal dialysis catheter placement. Possible etiologies include volume overload/CHF and PE.   ASSESSMENT / PLAN:  PULMONARY A: Acute hypoxic respiratory failure. Community-acquired pneumonia Rule out pulmonary embolism  Small pleural effusion- Right  P:   VQ scan in the morning to rule out PE. Patient is not able to have a CTA PE study due to poor kidney function Continuous and intermittent BiPAP as tolerated Chest x-ray in the morning  When necessary morphine and lorazepam to help with BiPAP adherence   CARDIOVASCULAR A:  Tachycardia Worsening edema-rule out CHF History of hypertension, hyperlipidemia and coronary artery disease  P:  Hemodynamic monitoring per ICU protocol Continue home dose of statin and antihypertensives  Aspirin daily. Furosemide infusion per nephrology 2-D a cold to  reevaluate left ventricular ejection fraction  RENAL A:   Chronic kidney disease stage V Hyperkalemia  P:   Nephrology consulted and following Monitor and correct electrolytes. Lasix infusion per nephrology Monitor intake and output as well as urine output  GASTROINTESTINAL A:   No acute issues  P:   Diabetic/cardiac diet as tolerated   HEMATOLOGIC A:   Anemia of chronic disease  P:  Trend hemoglobin and hematocrit Transfuse if hemoglobin less than 7. Heparin for DVT prophylaxis   INFECTIOUS A:   Community-acquired pneumonia without sepsis  P:   Continue cefepime Daily chest x-ray   ENDOCRINE A:   Type 2 diabetes mellitus   P:   Blood glucose monitoring with sliding scale insulin coverage   NEUROLOGIC A:   History of CVA  P:   RASS goal: Nonapplicable Monitor neurological status closely Optimize blood pressure and diabetes and lipid control   Disposition and family update: Wife at bedside. Updated on current treatment plan  Magdalene S.  [facility] ANP-BC Pulmonary and [place] Pager [phone] or [phone]  12/12/2015, 9:51 PM   STAFF NOTE: I, Dr. Vilinda Boehringer have personally reviewed patient's available data, including medical history, events of note, physical examination and test results as part of my evaluation. I have discussed with resident/NP NP Demaris Callander and other care providers such as pharmacist, RN and RRT.  In addition,  I personally evaluated patient and elicited key findings of   HPI:    O:  GEN- HEENT- CVS- LUNGS- ABD- MSK-   Recent Labs CBC Latest Ref Rng & Units 12/11/2015 12/10/2015 02/01/2015  WBC 3.8 - 10.6 K/uL 9.4 9.1 6.1  Hemoglobin 13.0 - 18.0 g/dL 10.5(L) 10.8(L) 12.2(L)  Hematocrit 40.0 - 52.0 % 30.2(L) 31.7(L) 35.0(L)  Platelets 150 - 440 K/uL 184 182 288      Recent Labs BMP Latest Ref Rng & Units 12/12/2015 12/11/2015 12/10/2015  Glucose 65 - 99 mg/dL 182(H) 231(H)  173(H)  BUN 6 - 20 mg/dL 72(H) 71(H) 71(H)  Creatinine 0.61 - 1.24 mg/dL 5.24(H) 4.74(H) 4.58(H)  Sodium 135 - 145 mmol/L 142 142 142  Potassium 3.5 - 5.1 mmol/L 4.8 5.3(H) 5.3(H)  Chloride 101 - 111 mmol/L 111 114(H) 115(H)  CO2 22 - 32 mmol/L 23 18(L) 16(L)  Calcium 8.9 - 10.3 mg/dL 7.5(L) 6.9(L) 6.9(L)       (The following images and results were reviewed by Dr. Stevenson Clinch on 12/12/2015). Dg Chest Port 1 View  Result Date: 12/11/2015 CLINICAL DATA:  Difficulty breathing. Discharge from the [facility] today after peritoneal dialysis catheter placement. EXAM: PORTABLE CHEST 1 VIEW COMPARISON:  02/02/2015 FINDINGS: Mild cardiac enlargement without pulmonary vascular congestion. There is a left perihilar infiltrate which could indicate pneumonia or asymmetrical edema. Hilar mass lesion is not excluded. Followup PA and lateral chest X-ray is recommended in 3-4 weeks following appropriate therapy to ensure resolution and exclude underlying malignancy. Blunting of the right costophrenic angle suggesting a small right pleural effusion. No pneumothorax. Degenerative changes in the spine. IMPRESSION: Mild cardiac enlargement. Left perihilar infiltration likely representing pneumonia. Followup PA and lateral chest X-ray is recommended in 3-4 weeks following appropriate therapy to ensure resolution and exclude underlying malignancy. Small right pleural effusion. Electronically Signed   By: Lucienne Capers M.D.   On: 12/11/2015 85:51   75 year old male with a history of hypertension, type 2 diabetes, coronary artery disease, chronic kidney disease, hyperlipidemia, and joint pain presenting with acute hypoxic respiratory failure, status post peritoneal dialysis catheter placement.   A: Respiratory failure - hypoxic Renal Failure - CKD V Edema Hyperkalemia Hypertension  P:   - Bipap PRN, may use tonight, transition off to HFNC - may consider V/Q scan to evaluate for PE - ECHO  .  Rest per NP/medical  resident whose note is outlined above and that I agree with  The patient is critically ill with multiple organ systems failure and requires high complexity decision making for assessment and support, frequent evaluation and titration of therapies, application of advanced monitoring technologies and extensive interpretation of multiple databases.   Pulmonary  Care Time devoted to patient care services described in this note is  45 Minutes.   This time reflects time of care of this signee Dr Vilinda Boehringer.  This critical care time does not reflect procedure time, or teaching time or supervisory time of PA/NP/Med-student/Med Resident etc but could involve care discussion time.  Vilinda Boehringer, MD [place] [facility] and [facility] Pager [phone]  please enter [phone]) On Call Pager [phone] (please enter 7-digits)  Note: This note was prepared with Dragon dictation along with smaller phrase technology. Any transcriptional errors that result from this process are unintentional.

## 2015-12-13 ENCOUNTER — Inpatient Hospital Stay: Payer: Medicare Other

## 2015-12-13 ENCOUNTER — Inpatient Hospital Stay
Admit: 2015-12-13 | Discharge: 2015-12-13 | Disposition: A | Discharge status: Home / Self Care | Payer: Medicare Other | Attending: Adult Health | Admitting: Adult Health

## 2015-12-13 DIAGNOSIS — J189 Pneumonia, unspecified organism: Principal | ICD-10-CM

## 2015-12-13 DIAGNOSIS — T80212A Local infection due to central venous catheter, initial encounter: Secondary | ICD-10-CM

## 2015-12-13 LAB — ECHOCARDIOGRAM COMPLETE
AOPV: 0.63 m/s
AV Peak grad: 8 mmHg
AVAREAVTI: 1.97 cm2
AVPKVEL: 144 cm/s
CHL CUP AV PEAK INDEX: 0.96
CHL CUP MV DEC (S): 201
EERAT: 11.33
EWDT: 201 ms
FS: 25 % — AB (ref 28–44)
Height: 69 in
IVS/LV PW RATIO, ED: 0.84
LA ID, A-P, ES: 33 mm
LA diam index: 1.6 cm/m2
LA vol A4C: 33.2 ml
LAVOL: 37.3 mL
LAVOLIN: 18.1 mL/m2
LEFT ATRIUM END SYS DIAM: 33 mm
LV E/e'average: 11.33
LV PW d: 11.8 mm — AB (ref 0.6–1.1)
LV SIMPSON'S DISK: 39
LV dias vol index: 55 mL/m2
LV dias vol: 114 mL (ref 62–150)
LV sys vol index: 34 mL/m2
LV sys vol: 70 mL — AB (ref 21–61)
LVEEMED: 11.33
LVELAT: 5.55 cm/s
LVOT area: 3.14 cm2
LVOT diameter: 20 mm
LVOTPV: 90.2 cm/s
Lateral S' vel: 14.2 cm/s
MV pk A vel: 86.6 m/s
MVPKEVEL: 62.9 m/s
Stroke v: 44 ml
TAPSE: 15.5 mm
TDI e' lateral: 5.55
TDI e' medial: 4.03
Weight: 3195.79 oz

## 2015-12-13 LAB — EXPECTORATED SPUTUM ASSESSMENT W REFEX TO RESP CULTURE

## 2015-12-13 LAB — CBC
HEMATOCRIT: 30.6 % — AB (ref 40.0–52.0)
HEMOGLOBIN: 10.8 g/dL — AB (ref 13.0–18.0)
MCH: 32.9 pg (ref 26.0–34.0)
MCHC: 35.4 g/dL (ref 32.0–36.0)
MCV: 92.8 fL (ref 80.0–100.0)
Platelets: 176 10*3/uL (ref 150–440)
RBC: 3.3 MIL/uL — ABNORMAL LOW (ref 4.40–5.90)
RDW: 13.2 % (ref 11.5–14.5)
WBC: 7.4 10*3/uL (ref 3.8–10.6)

## 2015-12-13 LAB — BASIC METABOLIC PANEL
Anion gap: 8 (ref 5–15)
BUN: 75 mg/dL — ABNORMAL HIGH (ref 6–20)
CALCIUM: 7.6 mg/dL — AB (ref 8.9–10.3)
CO2: 28 mmol/L (ref 22–32)
CREATININE: 5.54 mg/dL — AB (ref 0.61–1.24)
Chloride: 104 mmol/L (ref 101–111)
GFR calc Af Amer: 11 mL/min — ABNORMAL LOW (ref >60)
GFR calc non Af Amer: 9 mL/min — ABNORMAL LOW (ref >60)
GLUCOSE: 163 mg/dL — AB (ref 65–99)
Potassium: 3.7 mmol/L (ref 3.5–5.1)
Sodium: 140 mmol/L (ref 135–145)

## 2015-12-13 LAB — INFLUENZA PANEL BY PCR (TYPE A & B)
H1N1 flu by pcr: NOT DETECTED
Influenza A By PCR: NEGATIVE
Influenza B By PCR: NEGATIVE

## 2015-12-13 LAB — GLUCOSE, CAPILLARY
GLUCOSE-CAPILLARY: 170 mg/dL — AB (ref 65–99)
GLUCOSE-CAPILLARY: 209 mg/dL — AB (ref 65–99)
GLUCOSE-CAPILLARY: 222 mg/dL — AB (ref 65–99)
Glucose-Capillary: 235 mg/dL — ABNORMAL HIGH (ref 65–99)

## 2015-12-13 LAB — TROPONIN I
TROPONIN I: 16.95 ng/mL — AB (ref <0.03)
TROPONIN I: 10.5 ng/mL — AB (ref <0.03)
Troponin I: 21.5 ng/mL (ref <0.03)
Troponin I: 12.82 ng/mL (ref <0.03)

## 2015-12-13 LAB — PHOSPHORUS: PHOSPHORUS: 5.9 mg/dL — AB (ref 2.5–4.6)

## 2015-12-13 LAB — EXPECTORATED SPUTUM ASSESSMENT W GRAM STAIN, RFLX TO RESP C

## 2015-12-13 LAB — MAGNESIUM: Magnesium: 1.5 mg/dL — ABNORMAL LOW (ref 1.7–2.4)

## 2015-12-13 LAB — APTT
APTT: 33 s (ref 24–36)
aPTT: 69 seconds — ABNORMAL HIGH (ref 24–36)

## 2015-12-13 LAB — PROTIME-INR
INR: 0.98
PROTHROMBIN TIME: 13 s (ref 11.4–15.2)

## 2015-12-13 LAB — STREP PNEUMONIAE URINARY ANTIGEN: STREP PNEUMO URINARY ANTIGEN: NEGATIVE

## 2015-12-13 MED ORDER — LEVOFLOXACIN IN D5W 500 MG/100ML IV SOLN: 500.0000 mg | INTRAVENOUS | Status: DC

## 2015-12-13 MED ORDER — TECHNETIUM TC 99M DIETHYLENETRIAME-PENTAACETIC ACID
28.7500 | Freq: Once | INTRAVENOUS | Status: AC | PRN
  Administered 2015-12-13: 28.75 via INTRAVENOUS

## 2015-12-13 MED ORDER — TECHNETIUM TO 99M ALBUMIN AGGREGATED
3.9100 | Freq: Once | INTRAVENOUS | Status: AC | PRN
  Administered 2015-12-13: 3.91 via INTRAVENOUS

## 2015-12-13 MED ORDER — LEVOFLOXACIN IN D5W 750 MG/150ML IV SOLN
750.0000 mg | Freq: Once | INTRAVENOUS | Status: AC
  Administered 2015-12-13: 750 mg via INTRAVENOUS
  Filled 2015-12-13: qty 150

## 2015-12-13 MED ORDER — MAGNESIUM SULFATE IN D5W 1-5 GM/100ML-% IV SOLN
1.0000 g | Freq: Once | INTRAVENOUS | Status: AC
  Administered 2015-12-13: 1 g via INTRAVENOUS
  Filled 2015-12-13: qty 100

## 2015-12-13 MED ORDER — HEPARIN BOLUS VIA INFUSION
2000.0000 [IU] | Freq: Once | INTRAVENOUS | Status: DC
  Filled 2015-12-13: qty 2000

## 2015-12-13 MED ORDER — HEPARIN (PORCINE) IN NACL 100-0.45 UNIT/ML-% IJ SOLN
1300.0000 [IU]/h | INTRAMUSCULAR | Status: DC
Start: 2015-12-13 — End: 2015-12-14
  Administered 2015-12-13: 1150 [IU]/h via INTRAVENOUS
  Filled 2015-12-13 (×3): qty 250

## 2015-12-13 MED ORDER — SENNOSIDES-DOCUSATE SODIUM 8.6-50 MG PO TABS
1.0000 | ORAL_TABLET | Freq: Two times a day (BID) | ORAL | Status: DC
  Administered 2015-12-13 – 2015-12-15 (×4): 1 via ORAL
  Filled 2015-12-13 (×5): qty 1

## 2015-12-13 NOTE — Progress Notes (Signed)
Pharmacy Antibiotic Note  Todd [name] is a 75 y.o. male admitted on 12/10/2015 with pneumonia.  Pharmacy has been consulted for levofloxacin and cefepime dosing.  Plan: Pt received vanc 1g IV once. Pt currently receiving cefepime 2 g IV q 24 and levofloxacin 750 mg IV x 1 followed by 500 mg IV q 48 hours due to renal function.   Height: 5\' 9"  (175.3 cm) Weight: 199 lb 11.8 oz (90.6 kg) IBW/kg (Calculated) : 70.7  Temp (24hrs), Avg:98.1 F (36.7 C), Min:97.7 F (36.5 C), Max:98.4 F (36.9 C)   Recent Labs Lab 12/10/15 2353 12/11/15 0110 12/11/15 0544 12/12/15 0518 12/13/15 0402  WBC 9.1  --  9.4  --  7.4  CREATININE 4.58*  --  4.74* 5.24* 5.54*  LATICACIDVEN  --  1.1  --   --   --     Estimated Creatinine Clearance: 13 mL/min (by C-G formula based on SCr of 5.54 mg/dL (H)).    Allergies  Allergen Reactions  . Centrum Other (See Comments)    Bloating, indigestion, chest pain  . Valsartan Other (See Comments)    Unsure of symptoms, was on list from previous hospital and patient was unsure of name of medicine.  . Vitamin D Analogs Other (See Comments)    Bloating and swelling in abdomen region, indigestion. Prescription vit d not otc    Antimicrobials this admission: vanc 9/23 >> once cefepime 9/23 >>  Levofloxacin 9/23 >>   Microbiology results: 9/23 Sputum: sent  9/23 MRSA PCR: negative  Thank you for allowing pharmacy to be a part of this patient's care.  Darrow Bussing, PharmD Pharmacy Resident 12/13/2015 12:57 PM

## 2015-12-13 NOTE — Plan of Care (Signed)
Problem: Education: Goal: Knowledge of [facility] Education information/materials will improve Outcome: Progressing BP (!) 139/116   Pulse 84   Temp 98.4 F (36.9 C) (Oral)   Resp (!) 23   Ht 5\' 9"  (1.753 m)   Wt 90.6 kg (199 lb 11.8 oz)   SpO2 96%   BMI 29.50 kg/m  POC reviewed with patient, cont on self reposition in bed, pup to sacrum, vital signs closely monitored and any concerns addressed at this time. Will continue to monitor. Cont on Hi Flow @60   Foley in place

## 2015-12-13 NOTE — Progress Notes (Signed)
Subjective:  Troponin noted to be quite high at 21 this a.m. Creatinine relatively stable at 5.5. Still on high flow nasal cannula. Wife at bedside.  Objective:  Vital signs in last 24 hours:  Temp:  [97.7 F (36.5 C)-98.4 F (36.9 C)] 98.4 F (36.9 C) (09/25 0700) Pulse Rate:  [47-91] 74 (09/25 1000) Resp:  [14-24] 14 (09/25 1000) BP: (121-157)/(59-116) 148/65 (09/25 1000) SpO2:  [93 %-100 %] 100 % (09/25 1000) FiO2 (%):  [80 %-100 %] 100 % (09/25 0800)  Weight change:  Filed Weights   12/10/15 2349 12/12/15 0519  Weight: 88.5 kg (195 lb) 90.6 kg (199 lb 11.8 oz)    Intake/Output:    Intake/Output Summary (Last 24 hours) at 12/13/15 1036 Last data filed at 12/13/15 0723  Gross per 24 hour  Intake           452.44 ml  Output             4050 ml  Net         -3597.56 ml     Physical Exam: General: No acute distress, sitting in the bed   HEENT Anicteric, moist oral mucous membranes   Neck Supple   Pulm/lungs Crackles at bases bilaterally   CVS/Heart S1S2 no rubns  Abdomen:  Soft, nontender, nondistended, PD catheter in place   Extremities: + Dependent edema   Neurologic: Alert, oriented x 3 following commands  Skin: No acute rashes    Foley in place        Basic Metabolic Panel:   Recent Labs Lab 12/10/15 2353 12/11/15 0544 12/12/15 0518 12/13/15 0402  NA 142 142 142 140  K 5.3* 5.3* 4.8 3.7  CL 115* 114* 111 104  CO2 16* 18* 23 28  GLUCOSE 173* 231* 182* 163*  BUN 71* 71* 72* 75*  CREATININE 4.58* 4.74* 5.24* 5.54*  CALCIUM 6.9* 6.9* 7.5* 7.6*  MG  --   --   --  1.5*  PHOS  --   --   --  5.9*     CBC:  Recent Labs Lab 12/10/15 2353 12/11/15 0544 12/13/15 0402  WBC 9.1 9.4 7.4  HGB 10.8* 10.5* 10.8*  HCT 31.7* 30.2* 30.6*  MCV 94.8 94.8 92.8  PLT 182 184 176      Microbiology:  Recent Results (from the past 720 hour(s))  MRSA PCR Screening     Status: None   Collection Time: 12/11/15 11:47 AM  Result Value Ref Range Status   MRSA by PCR NEGATIVE NEGATIVE Final    Comment:        The GeneXpert MRSA Assay (FDA approved for NASAL specimens only), is one component of a comprehensive MRSA colonization surveillance program. It is not intended to diagnose MRSA infection nor to guide or monitor treatment for MRSA infections.     Coagulation Studies: No results for input(s): LABPROT, INR in the last 72 hours.  Urinalysis: No results for input(s): COLORURINE, LABSPEC, PHURINE, GLUCOSEU, HGBUR, BILIRUBINUR, KETONESUR, PROTEINUR, UROBILINOGEN, NITRITE, LEUKOCYTESUR in the last 72 hours.  Invalid input(s): APPERANCEUR    Imaging: Dg Chest Port 1 View  Result Date: 12/13/2015 CLINICAL DATA:  Pneumonia. EXAM: PORTABLE CHEST 1 VIEW COMPARISON:  12/11/2015 . FINDINGS: Mediastinum heart size stable. Interim progression of left perihilar and left lower lobe infiltrate. Mild right mid lung field subsegmental atelectasis. No pleural effusion or pneumothorax . IMPRESSION: 1. Interim progression of left perihilar and left lower lobe infiltrate. 2. Right mid lung field subsegmental atelectasis. Electronically  Signed   ByMarcello Moores  Register   On: 12/13/2015 07:09     Medications:   . furosemide (LASIX) infusion 8 mg/hr (12/13/15 0723)   . amLODipine  10 mg Oral Daily  . aspirin EC  81 mg Oral Daily  . carvedilol  6.25 mg Oral BID WC  . ceFEPime (MAXIPIME) IV  2 g Intravenous Q24H  . insulin aspart  0-5 Units Subcutaneous QHS  . insulin aspart  0-9 Units Subcutaneous TID WC  . insulin glargine  8 Units Subcutaneous QHS  . levofloxacin  750 mg Intravenous Q24H  . mouth rinse  15 mL Mouth Rinse BID  . metolazone  10 mg Oral Daily  . rosuvastatin  20 mg Oral QHS  . sodium bicarbonate  1,950 mg Oral TID   acetaminophen **OR** acetaminophen, LORazepam, ondansetron **OR** ondansetron (ZOFRAN) IV, oxyCODONE, oxyCODONE-acetaminophen, sodium chloride  Assessment/ Plan:  75 y.o.[place] male with medical problems of CKD st  5 followed by [place] [facility] nephrology, long standing DM, HTN,CAD , who was admitted to [facility] on 12/10/2015 for evaluation of shortness of breath.   1. CKD st 5 2. SOB - acute pulm edema and /or pneumonia (WBC count not elevated, no fever) 3. Peripheral edema 4. DM-2 with CKD 5. HTN w/ CKD 6. Anemia of CKD. 7. Secondary hyperparathyroidism. 8. Acute myocardial infarction. 9. Metabolic acidosis.    - Patient continues to have good urine output. States his shortness of breath has improved but is still on high flow nasal cannula. Troponin noted to be quite high at 21 as well.   Plan: At this point in time we will continue Lasix drip. We will reduce this to 6 mg/h. He appears to be diuresing adequately as above.  Recommend cardiology consultation given elevated troponin. If the patient needs cardiac catheterization he is extremely high risk for progression to end-stage renal disease. However he is already chronic kidney disease stage V. We may end up needing to place a PermCath given the fact that his PD catheter was just placed. We will continue to monitor the patient's progress rate closely.      LOS: 2 Luvada Salamone 9/25/201710:36 AM

## 2015-12-13 NOTE — Progress Notes (Signed)
[place] consulted for assistance in electrolyte and constipation management in this 3 yoM admitted with PNA.  1. Electrolytes: 9/25 am labs: K = 3.7. Phos = 5.9. Mag = 1.5. Pt not currently receiving dialysis. Due to CrCl 13 mL/min, will not replace mag at this time.  2. Constipation: Pt last documented BM on 9/21. Added senna/docusate bid. Will continue to monitor.  Allergies  Allergen Reactions  . Centrum Other (See Comments)    Bloating, indigestion, chest pain  . Valsartan Other (See Comments)    Unsure of symptoms, was on list from previous hospital and patient was unsure of name of medicine.  . Vitamin D Analogs Other (See Comments)    Bloating and swelling in abdomen region, indigestion. Prescription vit d not otc    Patient Measurements: Height: 5\' 9"  (175.3 cm) Weight: 199 lb 11.8 oz (90.6 kg) IBW/kg (Calculated) : 70.7   Vital Signs: Temp: 98.4 F (36.9 C) (09/25 0700) Temp Source: Oral (09/25 0700) BP: 145/67 (09/25 1200) Pulse Rate: 80 (09/25 1200) Intake/Output from previous day: 09/24 0701 - 09/25 0700 In: 491.4 [P.O.:240; I.V.:201.4; IV Piggyback:50] Out: 5700 [Urine:5700] Intake/Output from this shift: Total I/O In: 11.1 [I.V.:11.1] Out: 825 [Urine:825]  Labs:  Recent Labs  12/10/15 2353 12/11/15 0544 12/13/15 0402 12/13/15 1128  WBC 9.1 9.4 7.4  --   HGB 10.8* 10.5* 10.8*  --   HCT 31.7* 30.2* 30.6*  --   PLT 182 184 176  --   APTT  --   --   --  33  INR  --   --   --  0.98     Recent Labs  12/10/15 2353 12/11/15 0544 12/12/15 0518 12/13/15 0402  NA 142 142 142 140  K 5.3* 5.3* 4.8 3.7  CL 115* 114* 111 104  CO2 16* 18* 23 28  GLUCOSE 173* 231* 182* 163*  BUN 71* 71* 72* 75*  CREATININE 4.58* 4.74* 5.24* 5.54*  CALCIUM 6.9* 6.9* 7.5* 7.6*  MG  --   --   --  1.5*  PHOS  --   --   --  5.9*  PROT 6.5  --   --   --   ALBUMIN 3.2*  --   --   --   AST 25  --   --   --   ALT 16*  --   --   --   ALKPHOS 70   --   --   --   BILITOT 0.7  --   --   --   BILIDIR <0.1*  --   --   --   IBILI NOT CALCULATED  --   --   --    Estimated Creatinine Clearance: 13 mL/min (by C-G formula based on SCr of 5.54 mg/dL (H)).    Recent Labs  12/12/15 2106 12/13/15 0718 12/13/15 1116  GLUCAP 203* 170* 209*    Medications:  Scheduled:  . amLODipine  10 mg Oral Daily  . aspirin EC  81 mg Oral Daily  . carvedilol  6.25 mg Oral BID WC  . ceFEPime (MAXIPIME) IV  2 g Intravenous Q24H  . heparin  2,000 Units Intravenous Once  . insulin aspart  0-5 Units Subcutaneous QHS  . insulin aspart  0-9 Units Subcutaneous TID WC  . insulin glargine  8 Units Subcutaneous QHS  . [START ON 12/15/2015] levofloxacin (LEVAQUIN) IV  500 mg Intravenous Q48H  . levofloxacin  750 mg Intravenous Once  .  mouth rinse  15 mL Mouth Rinse BID  . metolazone  10 mg Oral Daily  . rosuvastatin  20 mg Oral QHS  . senna-docusate  1 tablet Oral BID  . sodium bicarbonate  1,950 mg Oral TID   Infusions:  . furosemide (LASIX) infusion 8 mg/hr (12/13/15 0723)  . heparin      Darrow Bussing, PharmD Pharmacy Resident 12/13/2015 12:53 PM

## 2015-12-13 NOTE — Progress Notes (Signed)
  Echocardiogram 2D Echocardiogram has been performed.  Jennette Dubin 12/13/2015, 10:44 AM

## 2015-12-13 NOTE — Progress Notes (Signed)
Pt slept with bipap for several hours last night. Requested it off at approximately 0300, and tolerated while he was asleep. Maintained oxygen sats. Now back on High flow nasal cannula. Wife at bedside, no indications of pain or discomfort.

## 2015-12-13 NOTE — Consult Note (Signed)
[facility]  CARDIOLOGY CONSULT NOTE  Patient ID: Todd [name] MRN: [id number] DOB/AGE: [birth year] 75 y.o.  Admit date: 12/10/2015 Referring Physician Tressia Miners Primary Physician [facility] Primary Cardiologist Nehemiah Massed Reason for Consultation non-STEMI  HPI: 75 year old male referred for evaluation for non-STEMI.The patient was admitted on 12/11/2015 with shortness of breath, hypoxia, and pneumoniaFollowing catheter placement for peritoneal dialysis.The patient was clinically improving, until 12/12/2015 at which time the patient had respiratory decompensation.Chest CT was performed which revealed bilateral pneumonia without evidence for pulmonary embolus. EKG is nondiagnostic. The patient denies chest pain. However,the patient has ruled in for non-ST elevation myocardial infarctionWith elevated troponin of 21.5, with follow-up troponin of 16.95. 2-D echocardiogram reveals mild reduced left ventricular function, with LVEF 40%.  Review of systems complete and found to be negative unless listed above     Past Medical History:  Diagnosis Date  . 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 CKD (chronic kidney disease)   . Coronary artery disease   . CVA (cerebrovascular accident) ([place])   . Diabetes mellitus    lantus and novolog;fasting sugars run 70-120  . Hyperlipidemia    takes Crestor daily  . Hypertension    takes Amlodipine and Metoprolol  . Joint pain    knees,elbows,back  . PONV (postoperative nausea and vomiting)     Past Surgical History:  Procedure Laterality Date  . BACK SURGERY  09/15/10  . CARDIAC CATHETERIZATION  01/2006   2 stents placed  . CATARACT EXTRACTION  12/21/10--02/01/11   right and left  . CHOLECYSTECTOMY  20+yrs ago  . COLONOSCOPY    . ELBOW SURGERY  2012   right elbow  . KNEE ARTHROSCOPY  80's   right knee  . TOTAL HIP ARTHROPLASTY    . ULNAR NERVE TRANSPOSITION  03/02/2011   Procedure: ULNAR NERVE DECOMPRESSION/TRANSPOSITION;  Surgeon: Peggyann Shoals,  MD;  Location: [place] NEURO ORS;  Service: Neurosurgery;  Laterality: Left;  LEFT ulnar nerve decompression    Prescriptions Prior to Admission  Medication Sig Dispense Refill Last Dose  . amLODipine (NORVASC) 10 MG tablet Take 10 mg by mouth daily.    Past Week at Unknown time  . aspirin EC 81 MG tablet Take 1 tablet (81 mg total) by mouth daily. 1 tablet 30 Past Week at Unknown time  . carvedilol (COREG) 6.25 MG tablet Take 2 tablets (12.5 mg total) by mouth 2 (two) times daily with a meal. (Patient taking differently: Take 6.25 mg by mouth 2 (two) times daily with a meal. ) 60 tablet 0 Past Week at Unknown time  . cholecalciferol (VITAMIN D) 1000 UNITS tablet Take 1,000 Units by mouth daily.     Past Week at Unknown time  . econazole nitrate 1 % cream Apply 1 application topically daily.   Past Week at Unknown time  . glucose blood (ONE TOUCH ULTRA TEST) test strip 1 each by Other route 4 (four) times daily.    Past Week at Unknown time  . insulin glargine (LANTUS) 100 UNIT/ML injection Inject 5-8 Units into the skin at bedtime. 5 units on Sunday, Tuesday, and Thursday. 8 units on Monday, Wednesday, Friday, and Saturday.   Past Week at Unknown time  . insulin lispro (HUMALOG) 100 UNIT/ML injection Inject 5 Units into the skin daily as needed for high blood sugar. Takes at lunch if blood sugar is over 150   Past Week at Unknown time  . ONE TOUCH ULTRA TEST test strip 1 each by Other route 4 (four)  times daily.    Past Week at Unknown time  . oxyCODONE-acetaminophen (PERCOCET/ROXICET) 5-325 MG tablet Take 1-2 tablets by mouth every 4 (four) hours as needed for severe pain.   prn at prn  . rosuvastatin (CRESTOR) 20 MG tablet Take 20 mg by mouth at bedtime.    Past Week at Unknown time  . sodium bicarbonate 650 MG tablet Take 1,950 mg by mouth 3 (three) times daily.    Past Week at Unknown time  . sodium polystyrene (KAYEXALATE) 15 GM/60ML suspension Take 15 g by mouth 2 (two) times a week.    Past Week  at Unknown time   Social History   Social History  . Marital status: Married    Spouse name: N/A  . Number of children: N/A  . Years of education: N/A   Occupational History  . Not on file.   Social History Main Topics  . Smoking status: Never Smoker  . Smokeless tobacco: Never Used  . Alcohol use No  . Drug use: No  . Sexual activity: Yes   Other Topics Concern  . Not on file   Social History Narrative  . No narrative on file    Family History  Problem Relation Age of Onset  . Diabetes Mother   . Heart disease Father   . Lung cancer Father   . Anesthesia problems Neg Hx   . Hypotension Neg Hx   . Malignant hyperthermia Neg Hx   . Pseudochol deficiency Neg Hx       Review of systems complete and found to be negative unless listed above      PHYSICAL EXAM  General: Well developed, well nourished, in no acute distress HEENT:  Normocephalic and atramatic Neck:  No JVD.  Lungs: Clear bilaterally to auscultation and percussion. Heart: HRRR . Normal S1 and S2 without gallops or murmurs.  Abdomen: Bowel sounds are positive, abdomen soft and non-tender  Msk:  Back normal, normal gait. Normal strength and tone for age. Extremities: No clubbing, cyanosis or edema.   Neuro: Alert and oriented X 3. Psych:  Good affect, responds appropriately  Labs:   Lab Results  Component Value Date   WBC 7.4 12/13/2015   HGB 10.8 (L) 12/13/2015   HCT 30.6 (L) 12/13/2015   MCV 92.8 12/13/2015   PLT 176 12/13/2015    Recent Labs Lab 12/10/15 2353  12/13/15 0402  NA 142  < > 140  K 5.3*  < > 3.7  CL 115*  < > 104  CO2 16*  < > 28  BUN 71*  < > 75*  CREATININE 4.58*  < > 5.54*  CALCIUM 6.9*  < > 7.6*  PROT 6.5  --   --   BILITOT 0.7  --   --   ALKPHOS 70  --   --   ALT 16*  --   --   AST 25  --   --   GLUCOSE 173*  < > 163*  < > = values in this interval not displayed. Lab Results  Component Value Date   CKTOTAL 407 (H) 02/28/2014   CKMB 6.4 (H) 02/28/2014    TROPONINI 16.95 (HH) 12/13/2015    Lab Results  Component Value Date   CHOL 109 02/02/2015   Lab Results  Component Value Date   HDL 31 (L) 02/02/2015   Lab Results  Component Value Date   LDLCALC 42 02/02/2015   Lab Results  Component Value Date   TRIG 181 (  H) 02/02/2015   Lab Results  Component Value Date   CHOLHDL 3.5 02/02/2015   No results found for: LDLDIRECT    Radiology: Ct Chest Wo Contrast  Result Date: 12/13/2015 CLINICAL DATA:  Difficulty breathing and requiring oxygen. EXAM: CT CHEST WITHOUT CONTRAST TECHNIQUE: Multidetector CT imaging of the chest was performed following the standard protocol without IV contrast. COMPARISON:  12/13/2015.  CT scan 12/13/2015 FINDINGS: Cardiovascular: Extensive coronary artery calcifications. Atherosclerotic calcifications in thoracic aorta with normal caliber. Atherosclerotic calcifications in the great vessels. Mediastinum/Nodes: Small lymph nodes throughout the mediastinum. Limited evaluation for hilar lymphadenopathy on this noncontrast examination. No axillary lymphadenopathy. Small amount of pericardial fluid. No gross abnormality to the esophagus. Lungs/Pleura: Trachea and mainstem bronchi are patent. Extensive ground-glass disease in the left lower lobe and posterior left upper lobe. Along with the ground-glass disease, there is confluent consolidation within the left upper and lower lobes. Similarly, there is ground-glass disease and consolidation in the medial right lower lobe. Also, airspace disease in the posterior right upper lobe. Small amount of the disease in the anterior right upper lobe. Thickening along the right fissures. No pleural effusions. Upper Abdomen: Gallbladder has been removed. Perinephric stranding is probably chronic based on a CT from 01/24/2015. Splenic artery is heavily calcified. Concern for thickening of the left hemidiaphragm on sequence 2 image 129. Musculoskeletal: Subcutaneous edema in the chest. Old left  posterior rib fractures. IMPRESSION: Bilateral airspace disease, left side greater than right. Findings are suggestive for bilateral pneumonia. Subcutaneous edema. Thickening along the posterior left hemidiaphragm is nonspecific but may be related to the adjacent lung disease and subcutaneous edema. Electronically Signed   By: Markus Daft M.D.   On: 12/13/2015 13:36   Nm Pulmonary Perf And Vent  Result Date: 12/13/2015 CLINICAL DATA:  Dyspnea. EXAM: NUCLEAR MEDICINE VENTILATION - PERFUSION LUNG SCAN TECHNIQUE: Ventilation images were obtained in multiple projections using inhaled aerosol Tc-42m DTPA. Perfusion images were obtained in multiple projections after intravenous injection of Tc-55m MAA. RADIOPHARMACEUTICALS:  28.75 mCi Technetium-6m DTPA aerosol inhalation and 3.91 mCi Technetium-41m MAA IV COMPARISON:  Chest CT 12/13/2015 and chest radiograph 12/13/2015 FINDINGS: Ventilation: Significant clumping of the radiopharmaceutical in the hilar regions, particularly in the left hilum. Decreased uptake in the left lung compared to the right. Perfusion: No wedge-shaped peripheral perfusion defects. There is slightly decreased uptake in the left lung compared to the right but this is a matched defect with the ventilation findings. In addition, there is extensive airspace disease in the left lung based on the chest radiograph. IMPRESSION: No evidence for pulmonary embolism. Electronically Signed   By: Markus Daft M.D.   On: 12/13/2015 13:21   Dg Chest Port 1 View  Result Date: 12/13/2015 CLINICAL DATA:  Central line placement. Bilateral airspace opacities in the lungs on CT. EXAM: PORTABLE CHEST 1 VIEW COMPARISON:  12/13/2015 CT scan FINDINGS: Right internal jugular center venous catheter tip: SVC. No pneumothorax. Airspace opacities are observed primarily in the left lung, with only very vague hazy density in the right lung. The appearance is similar to that noted on today' s chest CT. Thoracic spondylosis.  Old healed left posterolateral rib fractures. Atherosclerotic calcification of the aortic arch. IMPRESSION: 1. Right IJ line tip:  SVC.  No pneumothorax. 2. Stable appearance of airspace opacities in the lungs, left greater than right. 3. Atherosclerotic calcification of the aortic arch. 4. Thoracic spondylosis. Electronically Signed   By: Van Clines M.D.   On: 12/13/2015 17:07   Dg  Chest Port 1 View  Result Date: 12/13/2015 CLINICAL DATA:  Pneumonia. EXAM: PORTABLE CHEST 1 VIEW COMPARISON:  12/11/2015 . FINDINGS: Mediastinum heart size stable. Interim progression of left perihilar and left lower lobe infiltrate. Mild right mid lung field subsegmental atelectasis. No pleural effusion or pneumothorax . IMPRESSION: 1. Interim progression of left perihilar and left lower lobe infiltrate. 2. Right mid lung field subsegmental atelectasis. Electronically Signed   By: Marcello Moores  Register   On: 12/13/2015 07:09   Dg Chest Port 1 View  Result Date: 12/11/2015 CLINICAL DATA:  Difficulty breathing. [facility] the [facility] after peritoneal dialysis catheter placement. EXAM: PORTABLE CHEST 1 VIEW COMPARISON:  02/02/2015 FINDINGS: Mild cardiac enlargement without pulmonary vascular congestion. There is a left perihilar infiltrate which could indicate pneumonia or asymmetrical edema. Hilar mass lesion is not excluded. Followup PA and lateral chest X-ray is recommended in 3-4 weeks following appropriate therapy to ensure resolution and exclude underlying malignancy. Blunting of the right costophrenic angle suggesting a small right pleural effusion. No pneumothorax. Degenerative changes in the spine. IMPRESSION: Mild cardiac enlargement. Left perihilar infiltration likely representing pneumonia. Followup PA and lateral chest X-ray is recommended in 3-4 weeks following appropriate therapy to ensure resolution and exclude underlying malignancy. Small right pleural effusion. Electronically Signed   By: Lucienne Capers M.D.   On: 12/11/2015 00:26    EKG: Normal sinus rhythm  ASSESSMENT AND PLAN:   1. Non-STEMI, without chest pain, in the setting of bilateral pneumonia and respiratory failure 2. Bilateral pneumonia 3. End-stage renal disease, with recent catheter placement for peritoneal dialysis  Recommendations  1. Agree with overall current therapy 2. Consider Lovenox 1 mg/kg every 12 hr if no contraindication 3. Defer urgent cardiac catheterization in the absence of chest pain. May consider cardiac catheterization later this hospitalization, after respiratory status and pneumonia stabilized, or at later date.  SignedIsaias Cowman MD,PhD, [facility] 12/13/2015, 5:31 PM

## 2015-12-13 NOTE — Care Management (Signed)
Patient had a peritoneal dialysis catheter placed at [facility] 9/22.  he presented to the ED with shortness of breath requiring continuous bipap.  He was fweaned to HFNC.  At present, he is on a non-rebreather mask. IV Lasix drip and dose is being decreased.  Troponin is  elevated up to a high of 21.50.  Cardiology consult is pending. There is discussion of permcath placement for dialysis as at present, would be able to use his PD cath if requires a cardiac cath. No issues accessing medical care or medications.  has a very supportive wife.  Current with his PCP.

## 2015-12-13 NOTE — Progress Notes (Signed)
Inpatient Diabetes Program Recommendations  AACE/ADA: New Consensus Statement on Inpatient Glycemic Control (2015)  Target Ranges:  Prepandial:   less than 140 mg/dL      Peak postprandial:   less than 180 mg/dL (1-2 hours)      Critically ill patients:  140 - 180 mg/dL   Results for Todd [name], Todd [name] (MRN [id number]) as of 12/13/2015 10:03  Ref. Range 12/12/2015 07:50 12/12/2015 11:30 12/12/2015 15:56 12/12/2015 21:06 12/13/2015 07:18  Glucose-Capillary Latest Ref Range: 65 - 99 mg/dL 168 (H) 219 (H) 227 (H) 203 (H) 170 (H)   Review of Glycemic Control  Diabetes history: DM2 Outpatient Diabetes medications: Lantus 5 -8 units QHS (5 units on Sunday, Tuesday, Thursday and 8 units Monday, Wednesday, Friday, and Saturday), Humalog 5 units with lunch if CBG > 150 mg/dl Current orders for Inpatient glycemic control: Lantus 8 units QHS, Novolog 0-9 units TID with meals, Novolog 0-5 units QHS  Inpatient Diabetes Program Recommendations: Insulin - Meal Coverage: Post prandial glucose is consistently elevated. Please consider ordering Novolog 3 units TID with meals for meal coverage (along with Novolog correction scale).  Thanks, Barnie Alderman, RN, MSN, CDE Diabetes Coordinator Inpatient Diabetes Program [phone] (Team Pager from [facility] to [place]) [phone] ([facility] office) [phone] [facility] office) [phone] [facility] office)

## 2015-12-13 NOTE — Progress Notes (Signed)
[facility] NAME: Todd [name]    MR#:  [id number]  DATE OF BIRTH:  Sep 13, 1940  SUBJECTIVE:  CHIEF COMPLAINT:   Chief Complaint  Patient presents with  . Shortness of Breath   - feels better, Troponin is significantly elevated today. Patient denies any chest pain. -Remains on high flow nasal cannula, 60% FiO2 -Wife at bedside. Remains on Lasix drip  REVIEW OF SYSTEMS:  Review of Systems  Constitutional: Negative for chills, fever and malaise/fatigue.  HENT: Negative for ear discharge and ear pain.   Eyes: Negative for blurred vision and double vision.  Respiratory: Positive for hemoptysis and shortness of breath. Negative for cough and wheezing.   Cardiovascular: Negative for chest pain, palpitations and leg swelling.  Gastrointestinal: Negative for abdominal pain, constipation, diarrhea, nausea and vomiting.  Genitourinary: Negative for dysuria and urgency.  Musculoskeletal: Negative for myalgias.  Neurological: Negative for dizziness, sensory change, speech change, focal weakness, seizures and headaches.  Psychiatric/Behavioral: Negative for depression.    DRUG ALLERGIES:   Allergies  Allergen Reactions  . Centrum Other (See Comments)    Bloating, indigestion, chest pain  . Valsartan Other (See Comments)    Unsure of symptoms, was on list from previous hospital and patient was unsure of name of medicine.  . Vitamin D Analogs Other (See Comments)    Bloating and swelling in abdomen region, indigestion. Prescription vit d not otc    VITALS:  Blood pressure (!) 145/67, pulse 80, temperature 98.4 F (36.9 C), temperature source Oral, resp. rate (!) 21, height 5\' 9"  (1.753 m), weight 90.6 kg (199 lb 11.8 oz), SpO2 96 %.  PHYSICAL EXAMINATION:  Physical Exam  GENERAL:  75 y.o.-year-old patient lying in the bed with no acute distress. On high flow nasal cannula, not tachypneic today EYES: Pupils equal, round,  reactive to light and accommodation. No scleral icterus. Extraocular muscles intact.  HEENT: Head atraumatic, normocephalic. Oropharynx and nasopharynx clear.  NECK:  Supple, no jugular venous distention. No thyroid enlargement, no tenderness.  LUNGS: Normal breath sounds bilaterally, improved wheezing, no rales,rhonchi or crepitation. No use of accessory muscles of respiration. Decreased bibasilar breath sounds noted CARDIOVASCULAR: S1, S2 normal. No murmurs, rubs, or gallops.  ABDOMEN: Soft, nontender, nondistended. Bowel sounds present. No organomegaly or mass. Peritoneal dialysis catheter present EXTREMITIES: No pedal edema, cyanosis, or clubbing.  NEUROLOGIC: Cranial nerves II through XII are intact. Muscle strength 5/5 in all extremities. Sensation intact. Gait not checked.  PSYCHIATRIC: The patient is alert and oriented x 3.  SKIN: No obvious rash, lesion, or ulcer.    LABORATORY PANEL:   CBC  Recent Labs Lab 12/13/15 0402  WBC 7.4  HGB 10.8*  HCT 30.6*  PLT 176   ------------------------------------------------------------------------------------------------------------------  Chemistries   Recent Labs Lab 12/10/15 2353  12/13/15 0402  NA 142  < > 140  K 5.3*  < > 3.7  CL 115*  < > 104  CO2 16*  < > 28  GLUCOSE 173*  < > 163*  BUN 71*  < > 75*  CREATININE 4.58*  < > 5.54*  CALCIUM 6.9*  < > 7.6*  MG  --   --  1.5*  AST 25  --   --   ALT 16*  --   --   ALKPHOS 70  --   --   BILITOT 0.7  --   --   < > = values in this interval not displayed. ------------------------------------------------------------------------------------------------------------------  Cardiac Enzymes  Recent Labs Lab 12/13/15 1035  TROPONINI 16.95*   ------------------------------------------------------------------------------------------------------------------  RADIOLOGY:  Ct Chest Wo Contrast  Result Date: 12/13/2015 CLINICAL DATA:  Difficulty breathing and requiring oxygen.  EXAM: CT CHEST WITHOUT CONTRAST TECHNIQUE: Multidetector CT imaging of the chest was performed following the standard protocol without IV contrast. COMPARISON:  12/13/2015.  CT scan 12/13/2015 FINDINGS: Cardiovascular: Extensive coronary artery calcifications. Atherosclerotic calcifications in thoracic aorta with normal caliber. Atherosclerotic calcifications in the great vessels. Mediastinum/Nodes: Small lymph nodes throughout the mediastinum. Limited evaluation for hilar lymphadenopathy on this noncontrast examination. No axillary lymphadenopathy. Small amount of pericardial fluid. No gross abnormality to the esophagus. Lungs/Pleura: Trachea and mainstem bronchi are patent. Extensive ground-glass disease in the left lower lobe and posterior left upper lobe. Along with the ground-glass disease, there is confluent consolidation within the left upper and lower lobes. Similarly, there is ground-glass disease and consolidation in the medial right lower lobe. Also, airspace disease in the posterior right upper lobe. Small amount of the disease in the anterior right upper lobe. Thickening along the right fissures. No pleural effusions. Upper Abdomen: Gallbladder has been removed. Perinephric stranding is probably chronic based on a CT from 01/24/2015. Splenic artery is heavily calcified. Concern for thickening of the left hemidiaphragm on sequence 2 image 129. Musculoskeletal: Subcutaneous edema in the chest. Old left posterior rib fractures. IMPRESSION: Bilateral airspace disease, left side greater than right. Findings are suggestive for bilateral pneumonia. Subcutaneous edema. Thickening along the posterior left hemidiaphragm is nonspecific but may be related to the adjacent lung disease and subcutaneous edema. Electronically Signed   By: Markus Daft M.D.   On: 12/13/2015 13:36   Nm Pulmonary Perf And Vent  Result Date: 12/13/2015 CLINICAL DATA:  Dyspnea. EXAM: NUCLEAR MEDICINE VENTILATION - PERFUSION LUNG SCAN  TECHNIQUE: Ventilation images were obtained in multiple projections using inhaled aerosol Tc-68m DTPA. Perfusion images were obtained in multiple projections after intravenous injection of Tc-41m MAA. RADIOPHARMACEUTICALS:  28.75 mCi Technetium-60m DTPA aerosol inhalation and 3.91 mCi Technetium-28m MAA IV COMPARISON:  Chest CT 12/13/2015 and chest radiograph 12/13/2015 FINDINGS: Ventilation: Significant clumping of the radiopharmaceutical in the hilar regions, particularly in the left hilum. Decreased uptake in the left lung compared to the right. Perfusion: No wedge-shaped peripheral perfusion defects. There is slightly decreased uptake in the left lung compared to the right but this is a matched defect with the ventilation findings. In addition, there is extensive airspace disease in the left lung based on the chest radiograph. IMPRESSION: No evidence for pulmonary embolism. Electronically Signed   By: Markus Daft M.D.   On: 12/13/2015 13:21   Dg Chest Port 1 View  Result Date: 12/13/2015 CLINICAL DATA:  Pneumonia. EXAM: PORTABLE CHEST 1 VIEW COMPARISON:  12/11/2015 . FINDINGS: Mediastinum heart size stable. Interim progression of left perihilar and left lower lobe infiltrate. Mild right mid lung field subsegmental atelectasis. No pleural effusion or pneumothorax . IMPRESSION: 1. Interim progression of left perihilar and left lower lobe infiltrate. 2. Right mid lung field subsegmental atelectasis. Electronically Signed   By: Marcello Moores  Register   On: 12/13/2015 07:09    EKG:   Orders placed or performed during the hospital encounter of 12/10/15  . ED EKG within 10 minutes  . ED EKG within 10 minutes  . EKG 12-Lead  . 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  #1 acute hypoxic respiratory failure-secondary to pneumonia and pulmonary edema -remains on high flow nasal cannula. Today at 60% fio2, 50L flow rate, Continue to monitor in ICU stepdown - clinically improving, encourage incentive spirometry -Blood cultures are not done on admission. Continue broad-spectrum antibiotics with cefepime -CT chest showing bilateral pneumonia - negative  nuclear medicine VQ scan for any PE - appreciate pulmonary consult  #2 hyperkalemia-has chronic hyperkalemia -discontinue chronic Kayexalate as on lasix drip and potassium is normal - improved while on lasix drip. 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 Has peritoneal dialysis catheter. Plan was to start dialysis in a month after teaching is finished with PD catheter -Nephrology consulted.  - remains on lasix drip, voiding well, though cr in worsening - no acute indication to start dialysis now- but if needs this adm- will need a permacath  #4 Acute MI- no chest pain, troponin significantly elevated - per wife, has abnormal stress test in past- cardiac cath was deferred due to CKD stage 5 and supposed to happen after he got on dialysis - cardiology consulted - recycle troponins. 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  #5 DM- on SSI and low dose lantus  #6 DVT Prophylaxis- IV heparin   Physical therapy consult, once off of high flow.   All the records are reviewed and case discussed with Care Management/Social Workerr. Management plans discussed with the patient, family and they are in agreement.  CODE STATUS: Full code  TOTAL CRITICAL CARE TIME SPENT IN TAKING CARE OF THIS PATIENT: 37 minutes.   POSSIBLE D/C IN 2-3 DAYS, DEPENDING ON CLINICAL CONDITION.   Gladstone Lighter M.D on 12/13/2015 at 2:35 PM  Between 7am to 6pm - Pager - [phone]  After 6pm go  to [email or web address] - password [email or web address] [facility]  Office  [phone]  CC: Primary care physician; Leonel Ramsay, MD

## 2015-12-13 NOTE — Progress Notes (Signed)
[facility] Progess Note    ASSESSMENT/PLAN   DISCUSSION: 75 year old male with a history of hypertension, type 2 diabetes, coronary artery disease, chronic kidney disease, hyperlipidemia, and joint pain presenting with acute hypoxic respiratory failure, status post peritoneal dialysis catheter placement. Possible etiologies include volume overload/CHF and PE.   ASSESSMENT / PLAN:  PULMONARY A: Acute hypoxic respiratory failure. Community-acquired pneumonia Rule out pulmonary embolism   P:   VQ scan;  rule out PE. CT chest without contrast.  Continuous hi-flow O2.  Chest x-ray 9/25 images reviewed, left mid-zone infiltrate, minimal pulm edema, this does not appear to explain the patient's severe hypoxemia.    CARDIOVASCULAR A:  Tachycardia Worsening edema-rule out CHF History of hypertension, hyperlipidemia and coronary artery disease  P:  Hemodynamic monitoring per ICU protocol Continue home dose of statin and antihypertensives  Aspirin daily. Furosemide infusion per nephrology 2-D to reevaluate left ventricular ejection fraction -repeat troponin (was not trended on admission).   RENAL A:   Chronic kidney disease stage V Hyperkalemia  P:   Nephrology consulted and following Monitor and correct electrolytes. Lasix infusion per nephrology Monitor intake and output as well as urine output  GASTROINTESTINAL A:   No acute issues  P:   Diabetic/cardiac diet as tolerated   HEMATOLOGIC A:   Anemia of chronic disease  P:  Trend hemoglobin and hematocrit Transfuse if hemoglobin less than 7. Heparin for DVT prophylaxis   INFECTIOUS A:   Community-acquired pneumonia without sepsis  P:   Continue cefepime; add levoquin for atypical coverage.  Daily chest x-ray   CULTURES: MRSA screen negative   ANTIBIOTICS: Cefepime 12/11/2015 >> Levaquin 9/25>>  ENDOCRINE A:   Type 2 diabetes mellitus   P:   Blood glucose monitoring  with sliding scale insulin coverage   NEUROLOGIC A:   History of CVA  P:   RASS goal: Nonapplicable Monitor neurological status closely Optimize blood pressure and diabetes and lipid control  MAJOR EVENTS/TEST RESULTS: 12/10/2015: Peritoneal dialysis catheter placed at [facility]; later that evening, patient presents in the ED with hypoxia and dyspnea; admitted with community-acquired pneumonia, MI: Developed worsening respiratory distress and was transferred to the ICU on 12/11/2015.  Best Practices  DVT Prophylaxis: heparin GI Prophylaxis:--   ---------------------------------------   ----------------------------------------   Name: Todd [name] MRN: [id number] DOB: 1940-12-08    ADMISSION DATE:  12/10/2015  SUBJECTIVE:   Pt currently on nonrebreather. Has no difficulty other than some mild shortness of breath. He looks unusually comfortable despite high oxygen requirements.  Review of Systems:  Constitutional: Feels well. Cardiovascular: No chest pain.  Pulmonary: Denies dyspnea.   The remainder of systems were reviewed and were found to be negative other than what is documented in the HPI.    VITAL SIGNS: Temp:  [97.7 F (36.5 C)-98.4 F (36.9 C)] 98.4 F (36.9 C) (09/25 0700) Pulse Rate:  [47-91] 84 (09/25 0800) Resp:  [14-24] 23 (09/25 0800) BP: (121-157)/(59-116) 139/116 (09/25 0800) SpO2:  [93 %-100 %] 96 % (09/25 0800) FiO2 (%):  [80 %-100 %] 100 % (09/25 0800) HEMODYNAMICS:   VENTILATOR SETTINGS: FiO2 (%):  [80 %-100 %] 100 % INTAKE / OUTPUT:  Intake/Output Summary (Last 24 hours) at 12/13/15 0915 Last data filed at 12/13/15 0723  Gross per 24 hour  Intake           502.44 ml  Output             6050 ml  Net         -  5547.56 ml    PHYSICAL EXAMINATION: Physical Examination:   VS: BP (!) 139/116   Pulse 84   Temp 98.4 F (36.9 C) (Oral)   Resp (!) 23   Ht 5\' 9"  (1.753 m)   Wt 90.6 kg (199 lb 11.8 oz)   SpO2 96%   BMI 29.50 kg/m     General Appearance: No distress  Neuro:without focal findings, mental status normal. HEENT: PERRLA, EOM intact. Pulmonary: normal breath sounds   CardiovascularNormal S1,S2.  No m/r/g.   Abdomen: Benign, Soft, non-tender. Renal:  No costovertebral tenderness  GU:  Not performed at this time. Endocrine: No evident thyromegaly. Skin:   warm, no rashes, no ecchymosis  Extremities: normal, no cyanosis, clubbing.   LABS:   LABORATORY PANEL:   CBC  Recent Labs Lab 12/13/15 0402  WBC 7.4  HGB 10.8*  HCT 30.6*  PLT 176    Chemistries   Recent Labs Lab 12/10/15 2353  12/13/15 0402  NA 142  < > 140  K 5.3*  < > 3.7  CL 115*  < > 104  CO2 16*  < > 28  GLUCOSE 173*  < > 163*  BUN 71*  < > 75*  CREATININE 4.58*  < > 5.54*  CALCIUM 6.9*  < > 7.6*  MG  --   --  1.5*  PHOS  --   --  5.9*  AST 25  --   --   ALT 16*  --   --   ALKPHOS 70  --   --   BILITOT 0.7  --   --   < > = values in this interval not displayed.   Recent Labs Lab 12/11/15 2137 12/12/15 0750 12/12/15 1130 12/12/15 1556 12/12/15 2106 12/13/15 0718  GLUCAP 195* 168* 219* 227* 203* 170*    Recent Labs Lab 12/11/15 0024  PHART 7.22*  PCO2ART 43  PO2ART <31.0*    Recent Labs Lab 12/10/15 2353  AST 25  ALT 16*  ALKPHOS 70  BILITOT 0.7  ALBUMIN 3.2*    Cardiac Enzymes  Recent Labs Lab 12/10/15 2353  TROPONINI <0.03    RADIOLOGY:  Dg Chest Port 1 View  Result Date: 12/13/2015 CLINICAL DATA:  Pneumonia. EXAM: PORTABLE CHEST 1 VIEW COMPARISON:  12/11/2015 . FINDINGS: Mediastinum heart size stable. Interim progression of left perihilar and left lower lobe infiltrate. Mild right mid lung field subsegmental atelectasis. No pleural effusion or pneumothorax . IMPRESSION: 1. Interim progression of left perihilar and left lower lobe infiltrate. 2. Right mid lung field subsegmental atelectasis. Electronically Signed   By: Marcello Moores  Register   On: 12/13/2015 07:09       --Marda Stalker, MD.  ICU Pager: [phone] [facility] Office Number: [phone]  Patricia Pesa, M.D.  Vilinda Boehringer, M.D.  Merton Border, M.D  12/13/2015   Critical Care Attestation.  I have personally obtained a history, examined the patient, evaluated laboratory and imaging results, formulated the assessment and plan and placed orders. The Patient requires high complexity decision making for assessment and support, frequent evaluation and titration of therapies, application of advanced monitoring technologies and extensive interpretation of multiple databases. The patient has critical illness that could lead imminently to failure of 1 or more organ systems and requires the highest level of physician preparedness to intervene.  Critical Care Time devoted to patient care services described in this note is 60 minutes and is exclusive of time spent in procedures.

## 2015-12-13 NOTE — Progress Notes (Addendum)
ANTICOAGULATION CONSULT NOTE - Initial Consult  Pharmacy Consult for heparin bolus and drip Indication: chest pain/ACS  Allergies  Allergen Reactions  . Centrum Other (See Comments)    Bloating, indigestion, chest pain  . Valsartan Other (See Comments)    Unsure of symptoms, was on list from previous hospital and patient was unsure of name of medicine.  . Vitamin D Analogs Other (See Comments)    Bloating and swelling in abdomen region, indigestion. Prescription vit d not otc    Patient Measurements: Height: 5\' 9"  (175.3 cm) Weight: 199 lb 11.8 oz (90.6 kg) IBW/kg (Calculated) : 70.7 Heparin Dosing Weight: 88.4  Vital Signs: Temp: 98.4 F (36.9 C) (09/25 0700) Temp Source: Oral (09/25 0700) BP: 145/67 (09/25 1200) Pulse Rate: 80 (09/25 1200)  Labs:  Recent Labs  12/10/15 2353 12/11/15 0544 12/12/15 0518 12/13/15 0402 12/13/15 0403 12/13/15 1035 12/13/15 1128  HGB 10.8* 10.5*  --  10.8*  --   --   --   HCT 31.7* 30.2*  --  30.6*  --   --   --   PLT 182 184  --  176  --   --   --   APTT  --   --   --   --   --   --  33  LABPROT  --   --   --   --   --   --  13.0  INR  --   --   --   --   --   --  0.98  CREATININE 4.58* 4.74* 5.24* 5.54*  --   --   --   TROPONINI <0.03  --   --   --  21.50* 16.95*  --     Estimated Creatinine Clearance: 13 mL/min (by C-G formula based on SCr of 5.54 mg/dL (H)).   Medical History: Past Medical History:  Diagnosis Date  . 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 CKD (chronic kidney disease)   . Coronary artery disease   . CVA (cerebrovascular accident) ([place])   . Diabetes mellitus    lantus and novolog;fasting sugars run 70-120  . Hyperlipidemia    takes Crestor daily  . Hypertension    takes Amlodipine and Metoprolol  . Joint pain    knees,elbows,back  . PONV (postoperative nausea and vomiting)     Medications:  Scheduled:  . amLODipine  10 mg Oral Daily  . aspirin EC  81 mg Oral Daily  . carvedilol  6.25 mg Oral BID WC   . ceFEPime (MAXIPIME) IV  2 g Intravenous Q24H  . heparin  2,000 Units Intravenous Once  . insulin aspart  0-5 Units Subcutaneous QHS  . insulin aspart  0-9 Units Subcutaneous TID WC  . insulin glargine  8 Units Subcutaneous QHS  . [START ON 12/15/2015] levofloxacin (LEVAQUIN) IV  500 mg Intravenous Q48H  . levofloxacin  750 mg Intravenous Once  . mouth rinse  15 mL Mouth Rinse BID  . metolazone  10 mg Oral Daily  . rosuvastatin  20 mg Oral QHS  . senna-docusate  1 tablet Oral BID  . sodium bicarbonate  1,950 mg Oral TID   Infusions:  . furosemide (LASIX) infusion 8 mg/hr (12/13/15 0723)  . heparin      Assessment: Pharmacy has been consulted to dose heparin bolus and drip in this 50 yoM admitted on 9/22 with pna. Pt was found to have elevated troponin today (21.5).  Goal of Therapy:  Heparin level 0.3-0.7 units/ml Monitor platelets by anticoagulation protocol: Yes   Plan:  Give 2000 units bolus x 1 Start heparin infusion at 1150 units/hr Check anti-Xa level in 8 hours and daily while on heparin Continue to monitor H&H and platelets  Darrow Bussing, PharmD Pharmacy Resident 12/13/2015 1:01 PM   After discussion with Dr. Tressia Miners, patient has a h/o small thalamic hemorage so will continue with heparin drip without bolus.   Ulice Dash, PharmD Clinical Pharmacist

## 2015-12-13 NOTE — Plan of Care (Signed)
MD aware of the updated troponin 16.95 and is aware that the pt is in nuclear med for the VQ scan  And then will go to CT of the chest.  MD stated she would speak to pt's spouse if she is still at the bedside

## 2015-12-13 NOTE — Progress Notes (Signed)
[place] consulted for assistance in electrolyte and constipation management in this 67 yoM admitted with PNA.  1. Electrolytes: 9/25 am labs: K = 3.7. Phos = 5.9. Mag = 1.5. Pt not currently receiving dialysis. Will replace magnesium conservatively with 1 g iv.  2. Constipation: Pt last documented BM on 9/21. Added senna/docusate bid. Will continue to monitor.  Allergies  Allergen Reactions  . Centrum Other (See Comments)    Bloating, indigestion, chest pain  . Valsartan Other (See Comments)    Unsure of symptoms, was on list from previous hospital and patient was unsure of name of medicine.  . Vitamin D Analogs Other (See Comments)    Bloating and swelling in abdomen region, indigestion. Prescription vit d not otc    Patient Measurements: Height: 5\' 9"  (175.3 cm) Weight: 199 lb 11.8 oz (90.6 kg) IBW/kg (Calculated) : 70.7   Vital Signs: Temp: 98.4 F (36.9 C) (09/25 0700) Temp Source: Oral (09/25 0700) BP: 127/57 (09/25 1300) Pulse Rate: 82 (09/25 1300) Intake/Output from previous day: 09/24 0701 - 09/25 0700 In: 491.4 [P.O.:240; I.V.:201.4; IV Piggyback:50] Out: 5700 [Urine:5700] Intake/Output from this shift: Total I/O In: 11.1 [I.V.:11.1] Out: 825 [Urine:825]  Labs:  Recent Labs  12/10/15 2353 12/11/15 0544 12/13/15 0402 12/13/15 1128  WBC 9.1 9.4 7.4  --   HGB 10.8* 10.5* 10.8*  --   HCT 31.7* 30.2* 30.6*  --   PLT 182 184 176  --   APTT  --   --   --  33  INR  --   --   --  0.98     Recent Labs  12/10/15 2353 12/11/15 0544 12/12/15 0518 12/13/15 0402  NA 142 142 142 140  K 5.3* 5.3* 4.8 3.7  CL 115* 114* 111 104  CO2 16* 18* 23 28  GLUCOSE 173* 231* 182* 163*  BUN 71* 71* 72* 75*  CREATININE 4.58* 4.74* 5.24* 5.54*  CALCIUM 6.9* 6.9* 7.5* 7.6*  MG  --   --   --  1.5*  PHOS  --   --   --  5.9*  PROT 6.5  --   --   --   ALBUMIN 3.2*  --   --   --   AST 25  --   --   --   ALT 16*  --   --   --   ALKPHOS 70  --   --    --   BILITOT 0.7  --   --   --   BILIDIR <0.1*  --   --   --   IBILI NOT CALCULATED  --   --   --    Estimated Creatinine Clearance: 13 mL/min (by C-G formula based on SCr of 5.54 mg/dL (H)).    Recent Labs  12/12/15 2106 12/13/15 0718 12/13/15 1116  GLUCAP 203* 170* 209*    Medications:  Scheduled:  . amLODipine  10 mg Oral Daily  . aspirin EC  81 mg Oral Daily  . carvedilol  6.25 mg Oral BID WC  . ceFEPime (MAXIPIME) IV  2 g Intravenous Q24H  . insulin aspart  0-5 Units Subcutaneous QHS  . insulin aspart  0-9 Units Subcutaneous TID WC  . insulin glargine  8 Units Subcutaneous QHS  . [START ON 12/15/2015] levofloxacin (LEVAQUIN) IV  500 mg Intravenous Q48H  . levofloxacin  750 mg Intravenous Once  . magnesium sulfate 1 - 4 g bolus IVPB  1 g  Intravenous Once  . mouth rinse  15 mL Mouth Rinse BID  . metolazone  10 mg Oral Daily  . rosuvastatin  20 mg Oral QHS  . senna-docusate  1 tablet Oral BID  . sodium bicarbonate  1,950 mg Oral TID   Infusions:  . furosemide (LASIX) infusion Stopped (12/13/15 1300)  . heparin      Darrow Bussing, PharmD Pharmacy Resident 12/13/2015 4:05 PM

## 2015-12-13 NOTE — Procedures (Signed)
Central Venous Catheter Placement: Indication: Patient receiving vesicant or irritant drug.; Patient receiving intravenous therapy for longer than 5 days.; Patient has limited or no vascular access.   Consent:obtained.   Risks and benefits explained in detail including risk of infection, bleeding, respiratory failure and death..   Hand washing performed prior to starting the procedure.   Procedure: An active timeout was performed and correct patient, name, & ID confirmed.  After explaining risk and benefits, patient was positioned correctly for central venous access. Patient was prepped using strict sterile technique including chlorohexadine preps, sterile drape, sterile gown and sterile gloves.  The area was prepped, draped and anesthetized in the usual sterile manner. Patient comfort was obtained.  A triple lumen catheter was placed in right Internal Jugular Vein There was good blood return, catheter caps were placed on lumens, catheter flushed easily, the line was secured and a sterile dressing and BIO-PATCH applied.   Ultrasound was used to visualize vasculature and guidance of needle.   Number of Attempts: 1 Complications:none Estimated Blood Loss: none Chest Radiograph indicated and ordered.  Operator: Marda Stalker, M.D.   Marda Stalker, M.D.  Pulmonary & Critical Care Medicine

## 2015-12-13 NOTE — Plan of Care (Signed)
Called MD for critical troponin of 21.50. New orders placed. No complaints of chest pain, or sob overnight and currently. Will cont to monitor  Echo done  Will accompany patient for CT for CT of chest

## 2015-12-14 ENCOUNTER — Inpatient Hospital Stay: Payer: Medicare Other

## 2015-12-14 LAB — BLOOD GAS, ARTERIAL
Acid-Base Excess: 8.4 mmol/L — ABNORMAL HIGH (ref 0.0–2.0)
Bicarbonate: 32.8 mmol/L — ABNORMAL HIGH (ref 20.0–28.0)
FIO2: 0.36
O2 SAT: 93.9 %
PCO2 ART: 44 mmHg (ref 32.0–48.0)
PO2 ART: 65 mmHg — AB (ref 83.0–108.0)
Patient temperature: 37
pH, Arterial: 7.48 — ABNORMAL HIGH (ref 7.350–7.450)

## 2015-12-14 LAB — GLUCOSE, CAPILLARY
GLUCOSE-CAPILLARY: 280 mg/dL — AB (ref 65–99)
Glucose-Capillary: 175 mg/dL — ABNORMAL HIGH (ref 65–99)
Glucose-Capillary: 229 mg/dL — ABNORMAL HIGH (ref 65–99)
Glucose-Capillary: 212 mg/dL — ABNORMAL HIGH (ref 65–99)

## 2015-12-14 LAB — HEPARIN LEVEL (UNFRACTIONATED)
HEPARIN UNFRACTIONATED: 0.25 [IU]/mL — AB (ref 0.30–0.70)
Heparin Unfractionated: 0.5 IU/mL (ref 0.30–0.70)

## 2015-12-14 LAB — BASIC METABOLIC PANEL
Anion gap: 12 (ref 5–15)
BUN: 87 mg/dL — ABNORMAL HIGH (ref 6–20)
CALCIUM: 7.4 mg/dL — AB (ref 8.9–10.3)
CO2: 31 mmol/L (ref 22–32)
CREATININE: 5.79 mg/dL — AB (ref 0.61–1.24)
Chloride: 97 mmol/L — ABNORMAL LOW (ref 101–111)
GFR, EST AFRICAN AMERICAN: 10 mL/min — AB (ref >60)
GFR, EST NON AFRICAN AMERICAN: 9 mL/min — AB (ref >60)
Glucose, Bld: 151 mg/dL — ABNORMAL HIGH (ref 65–99)
Potassium: 3.9 mmol/L (ref 3.5–5.1)
Sodium: 140 mmol/L (ref 135–145)

## 2015-12-14 LAB — MYCOPLASMA PNEUMONIAE ANTIBODY, IGM

## 2015-12-14 LAB — CBC
HEMATOCRIT: 30.2 % — AB (ref 40.0–52.0)
Hemoglobin: 10.4 g/dL — ABNORMAL LOW (ref 13.0–18.0)
MCH: 31.9 pg (ref 26.0–34.0)
MCHC: 34.6 g/dL (ref 32.0–36.0)
MCV: 92.2 fL (ref 80.0–100.0)
PLATELETS: 195 10*3/uL (ref 150–440)
RBC: 3.27 MIL/uL — ABNORMAL LOW (ref 4.40–5.90)
RDW: 12.5 % (ref 11.5–14.5)
WBC: 6.3 10*3/uL (ref 3.8–10.6)

## 2015-12-14 LAB — MAGNESIUM: Magnesium: 1.7 mg/dL (ref 1.7–2.4)

## 2015-12-14 MED ORDER — HEPARIN SODIUM (PORCINE) 5000 UNIT/ML IJ SOLN
5000.0000 [IU] | Freq: Three times a day (TID) | INTRAMUSCULAR | Status: DC
  Administered 2015-12-14 – 2015-12-21 (×21): 5000 [IU] via SUBCUTANEOUS
  Filled 2015-12-14 (×21): qty 1

## 2015-12-14 MED ORDER — INSULIN ASPART 100 UNIT/ML ~~LOC~~ SOLN
3.0000 [IU] | Freq: Three times a day (TID) | SUBCUTANEOUS | Status: DC
  Administered 2015-12-14 – 2015-12-17 (×9): 3 [IU] via SUBCUTANEOUS
  Filled 2015-12-14 (×8): qty 3

## 2015-12-14 MED ORDER — DEXTROSE 5 % IV SOLN
1.0000 g | INTRAVENOUS | Status: DC
  Administered 2015-12-14 – 2015-12-18 (×5): 1 g via INTRAVENOUS
  Filled 2015-12-14 (×5): qty 1

## 2015-12-14 MED ORDER — POLYETHYLENE GLYCOL 3350 17 G PO PACK
17.0000 g | PACK | Freq: Two times a day (BID) | ORAL | Status: AC
Start: 2015-12-14 — End: 2015-12-16
  Administered 2015-12-14 – 2015-12-15 (×3): 17 g via ORAL
  Filled 2015-12-14 (×4): qty 1

## 2015-12-14 MED ORDER — MORPHINE SULFATE (PF) 2 MG/ML IV SOLN
2.0000 mg | Freq: Once | INTRAVENOUS | Status: AC
  Administered 2015-12-14: 2 mg via INTRAVENOUS
  Filled 2015-12-14: qty 1

## 2015-12-14 MED ORDER — LEVOFLOXACIN 500 MG PO TABS
500.0000 mg | ORAL_TABLET | ORAL | Status: DC
  Administered 2015-12-15 – 2015-12-21 (×4): 500 mg via ORAL
  Filled 2015-12-14 (×4): qty 1

## 2015-12-14 MED ORDER — HEPARIN BOLUS VIA INFUSION
1300.0000 [IU] | Freq: Once | INTRAVENOUS | Status: DC
  Filled 2015-12-14: qty 1300

## 2015-12-14 MED ORDER — PROMETHAZINE HCL 25 MG/ML IJ SOLN
12.5000 mg | Freq: Four times a day (QID) | INTRAMUSCULAR | Status: DC | PRN
  Administered 2015-12-14: 12.5 mg via INTRAVENOUS
  Filled 2015-12-14: qty 1

## 2015-12-14 NOTE — Progress Notes (Signed)
75 y/o M with a h/o CAD, CKD stage V, DM, and HTN previously on heparin drip for NSTEMI without chest pain. After discussion with cardiology, patient will not be undergoing immediate cardiac cath due to renal and respiratory status. Cardiology recommends withholding full-dose anticoagulation at this point.   Ulice Dash, PharmD Clinical Pharmacist

## 2015-12-14 NOTE — Progress Notes (Signed)
Subjective:  Creatinine about the same this a.m. at 5.7. Next line patient remains on high flow nasal cannula. Troponin most recently was 12.8.  Objective:  Vital signs in last 24 hours:  Temp:  [98.4 F (36.9 C)-99.1 F (37.3 C)] 98.4 F (36.9 C) (09/26 0800) Pulse Rate:  [73-90] 79 (09/26 0800) Resp:  [10-22] 11 (09/26 0800) BP: (127-156)/(57-78) 148/70 (09/26 0800) SpO2:  [92 %-99 %] 95 % (09/26 0800) FiO2 (%):  [48 %-70 %] 48 % (09/26 0800)  Weight change:  Filed Weights   12/10/15 2349 12/12/15 0519  Weight: 88.5 kg (195 lb) 90.6 kg (199 lb 11.8 oz)    Intake/Output:    Intake/Output Summary (Last 24 hours) at 12/14/15 1045 Last data filed at 12/14/15 0800  Gross per 24 hour  Intake            966.3 ml  Output             3175 ml  Net          -2208.7 ml     Physical Exam: General: No acute distress, sitting in the bed   HEENT Anicteric, moist oral mucous membranes, High flow nasal cannula   Neck Supple   Pulm/lungs Crackles at bases bilaterally   CVS/Heart S1S2 no rubs  Abdomen:  Soft, nontender, nondistended, PD catheter in place   Extremities: + Dependent edema   Neurologic: Alert, oriented x 3 following commands  Skin: No acute rashes    Foley in place        Basic Metabolic Panel:   Recent Labs Lab 12/10/15 2353 12/11/15 0544 12/12/15 0518 12/13/15 0402 12/14/15 0152  NA 142 142 142 140 140  K 5.3* 5.3* 4.8 3.7 3.9  CL 115* 114* 111 104 97*  CO2 16* 18* 23 28 31   GLUCOSE 173* 231* 182* 163* 151*  BUN 71* 71* 72* 75* 87*  CREATININE 4.58* 4.74* 5.24* 5.54* 5.79*  CALCIUM 6.9* 6.9* 7.5* 7.6* 7.4*  MG  --   --   --  1.5* 1.7  PHOS  --   --   --  5.9*  --      CBC:  Recent Labs Lab 12/10/15 2353 12/11/15 0544 12/13/15 0402 12/14/15 0152  WBC 9.1 9.4 7.4 6.3  HGB 10.8* 10.5* 10.8* 10.4*  HCT 31.7* 30.2* 30.6* 30.2*  MCV 94.8 94.8 92.8 92.2  PLT 182 184 176 195      Microbiology:  Recent Results (from the past 720  hour(s))  MRSA PCR Screening     Status: None   Collection Time: 12/11/15 11:47 AM  Result Value Ref Range Status   MRSA by PCR NEGATIVE NEGATIVE Final    Comment:        The GeneXpert MRSA Assay (FDA approved for NASAL specimens only), is one component of a comprehensive MRSA colonization surveillance program. It is not intended to diagnose MRSA infection nor to guide or monitor treatment for MRSA infections.   Culture, expectorated sputum-assessment     Status: None   Collection Time: 12/13/15  4:00 PM  Result Value Ref Range Status   Specimen Description SPUTUM  Final   Special Requests NONE  Final   Sputum evaluation   Final    Sputum specimen not acceptable for testing.  Please recollect.   SPOKE TO Tia Masker 12/13/15 @ 1955  [place]    Report Status 12/13/2015 FINAL  Final    Coagulation Studies:  Recent Labs  12/13/15 1128  LABPROT  13.0  INR 0.98    Urinalysis: No results for input(s): COLORURINE, LABSPEC, PHURINE, GLUCOSEU, HGBUR, BILIRUBINUR, KETONESUR, PROTEINUR, UROBILINOGEN, NITRITE, LEUKOCYTESUR in the last 72 hours.  Invalid input(s): APPERANCEUR    Imaging: Ct Chest Wo Contrast  Result Date: 12/13/2015 CLINICAL DATA:  Difficulty breathing and requiring oxygen. EXAM: CT CHEST WITHOUT CONTRAST TECHNIQUE: Multidetector CT imaging of the chest was performed following the standard protocol without IV contrast. COMPARISON:  12/13/2015.  CT scan 12/13/2015 FINDINGS: Cardiovascular: Extensive coronary artery calcifications. Atherosclerotic calcifications in thoracic aorta with normal caliber. Atherosclerotic calcifications in the great vessels. Mediastinum/Nodes: Small lymph nodes throughout the mediastinum. Limited evaluation for hilar lymphadenopathy on this noncontrast examination. No axillary lymphadenopathy. Small amount of pericardial fluid. No gross abnormality to the esophagus. Lungs/Pleura: Trachea and mainstem bronchi are patent. Extensive  ground-glass disease in the left lower lobe and posterior left upper lobe. Along with the ground-glass disease, there is confluent consolidation within the left upper and lower lobes. Similarly, there is ground-glass disease and consolidation in the medial right lower lobe. Also, airspace disease in the posterior right upper lobe. Small amount of the disease in the anterior right upper lobe. Thickening along the right fissures. No pleural effusions. Upper Abdomen: Gallbladder has been removed. Perinephric stranding is probably chronic based on a CT from 01/24/2015. Splenic artery is heavily calcified. Concern for thickening of the left hemidiaphragm on sequence 2 image 129. Musculoskeletal: Subcutaneous edema in the chest. Old left posterior rib fractures. IMPRESSION: Bilateral airspace disease, left side greater than right. Findings are suggestive for bilateral pneumonia. Subcutaneous edema. Thickening along the posterior left hemidiaphragm is nonspecific but may be related to the adjacent lung disease and subcutaneous edema. Electronically Signed   By: Markus Daft M.D.   On: 12/13/2015 13:36   Nm Pulmonary Perf And Vent  Result Date: 12/13/2015 CLINICAL DATA:  Dyspnea. EXAM: NUCLEAR MEDICINE VENTILATION - PERFUSION LUNG SCAN TECHNIQUE: Ventilation images were obtained in multiple projections using inhaled aerosol Tc-48m DTPA. Perfusion images were obtained in multiple projections after intravenous injection of Tc-36m MAA. RADIOPHARMACEUTICALS:  28.75 mCi Technetium-106m DTPA aerosol inhalation and 3.91 mCi Technetium-40m MAA IV COMPARISON:  Chest CT 12/13/2015 and chest radiograph 12/13/2015 FINDINGS: Ventilation: Significant clumping of the radiopharmaceutical in the hilar regions, particularly in the left hilum. Decreased uptake in the left lung compared to the right. Perfusion: No wedge-shaped peripheral perfusion defects. There is slightly decreased uptake in the left lung compared to the right but this is a  matched defect with the ventilation findings. In addition, there is extensive airspace disease in the left lung based on the chest radiograph. IMPRESSION: No evidence for pulmonary embolism. Electronically Signed   By: Markus Daft M.D.   On: 12/13/2015 13:21   Dg Chest Port 1 View  Result Date: 12/13/2015 CLINICAL DATA:  Central line placement. Bilateral airspace opacities in the lungs on CT. EXAM: PORTABLE CHEST 1 VIEW COMPARISON:  12/13/2015 CT scan FINDINGS: Right internal jugular center venous catheter tip: SVC. No pneumothorax. Airspace opacities are observed primarily in the left lung, with only very vague hazy density in the right lung. The appearance is similar to that noted on today' s chest CT. Thoracic spondylosis. Old healed left posterolateral rib fractures. Atherosclerotic calcification of the aortic arch. IMPRESSION: 1. Right IJ line tip:  SVC.  No pneumothorax. 2. Stable appearance of airspace opacities in the lungs, left greater than right. 3. Atherosclerotic calcification of the aortic arch. 4. Thoracic spondylosis. Electronically Signed   By: Thayer Jew  Janeece Fitting M.D.   On: 12/13/2015 17:07   Dg Chest Port 1 View  Result Date: 12/13/2015 CLINICAL DATA:  Pneumonia. EXAM: PORTABLE CHEST 1 VIEW COMPARISON:  12/11/2015 . FINDINGS: Mediastinum heart size stable. Interim progression of left perihilar and left lower lobe infiltrate. Mild right mid lung field subsegmental atelectasis. No pleural effusion or pneumothorax . IMPRESSION: 1. Interim progression of left perihilar and left lower lobe infiltrate. 2. Right mid lung field subsegmental atelectasis. Electronically Signed   By: Marcello Moores  Register   On: 12/13/2015 07:09     Medications:   . furosemide (LASIX) infusion 8 mg/hr (12/14/15 0800)   . amLODipine  10 mg Oral Daily  . aspirin EC  81 mg Oral Daily  . carvedilol  6.25 mg Oral BID WC  . ceFEPime (MAXIPIME) IV  1 g Intravenous Q24H  . insulin aspart  0-5 Units Subcutaneous QHS  .  insulin aspart  0-9 Units Subcutaneous TID WC  . insulin glargine  8 Units Subcutaneous QHS  . [START ON 12/15/2015] levofloxacin (LEVAQUIN) IV  500 mg Intravenous Q48H  . mouth rinse  15 mL Mouth Rinse BID  . metolazone  10 mg Oral Daily  . rosuvastatin  20 mg Oral QHS  . senna-docusate  1 tablet Oral BID  . sodium bicarbonate  1,950 mg Oral TID   acetaminophen **OR** acetaminophen, LORazepam, ondansetron **OR** ondansetron (ZOFRAN) IV, oxyCODONE, oxyCODONE-acetaminophen, sodium chloride  Assessment/ Plan:  75 y.o.[place]uc[place] male with medical problems of CKD st 5 followed by [place] [facility] nephrology, long standing DM, HTN,CAD , who was admitted to [facility] on 12/10/2015 for evaluation of shortness of breath.   1. CKD st 5 2. SOB - acute pulm edema and /or pneumonia (WBC count not elevated, no fever) 3. Peripheral edema 4. DM-2 with CKD 5. HTN w/ CKD 6. Anemia of CKD. 7. Secondary hyperparathyroidism. 8. Acute myocardial infarction. 9. Metabolic acidosis.    - Patient remains on high flow nasal cannula and he continues to have good urine output on Lasix drip.   Plan: The patient has diuresed quite well since admission. His shortness of breath overall has improved though he continues to require high flow nasal cannula. Hopefully this can be weaned. Given good urine output no urgent indication to start dialysis at this time. The patient had recent peritoneal dialysis catheter placed. We will continue to monitor the patient's progress closely. For now continue Lasix drip.     LOS: 3 Mack Thurmon 9/26/201710:45 AM

## 2015-12-14 NOTE — Progress Notes (Signed)
Inpatient Diabetes Program Recommendations  AACE/ADA: New Consensus Statement on Inpatient Glycemic Control (2015)  Target Ranges:  Prepandial:   less than 140 mg/dL      Peak postprandial:   less than 180 mg/dL (1-2 hours)      Critically ill patients:  140 - 180 mg/dL  Results for Todd [name], Todd [name] (MRN [id number]) as of 12/14/2015 09:44  Ref. Range 12/13/2015 07:18 12/13/2015 11:16 12/13/2015 16:17 12/13/2015 21:09 12/14/2015 07:21  Glucose-Capillary Latest Ref Range: 65 - 99 mg/dL 170 (H) 209 (H) 222 (H) 235 (H) 175 (H)   Results for Todd [name], Todd [name] (MRN [id number]) as of 12/14/2015 09:44  Ref. Range 12/12/2015 07:50 12/12/2015 11:30 12/12/2015 15:56 12/12/2015 21:06  Glucose-Capillary Latest Ref Range: 65 - 99 mg/dL 168 (H) 219 (H) 227 (H) 203 (H)   Review of Glycemic Control Diabetes history: DM2 Outpatient Diabetes medications: Lantus 5 -8 units QHS (5 units on Sunday, Tuesday, Thursday and 8 units Monday, Wednesday, Friday, and Saturday), Humalog 5 units with lunch if CBG > 150 mg/dl Current orders for Inpatient glycemic control: Lantus 8 units QHS, Novolog 0-9 units TID with meals, Novolog 0-5 units QHS  Inpatient Diabetes Program Recommendations: Insulin - Meal Coverage: Post prandial glucose is consistently elevated. Please consider ordering Novolog 3 units TID with meals for meal coverage (along with Novolog correction scale).  Thanks, Barnie Alderman, RN, MSN, CDE Diabetes Coordinator Inpatient Diabetes Program [phone] (Team Pager from [place] to [place]) [phone] ([facility] office) [phone] [facility] office) [phone] [facility] office)

## 2015-12-14 NOTE — Progress Notes (Addendum)
Pharmacy Antibiotic Note  Todd [name] is a 75 y.o. male admitted on 12/10/2015 with pneumonia. Pharmacy has been consulted for levofloxacin and cefepime dosing.  Plan: Pt received vanc 1g IV once. Due to renal function and low risk of pseudomonas, cefepime 2 g was decreased to 1g IV q 24. Pt also receiving levofloxacin 750 mg IV x 1 followed by 500 mg PO q 48 hours due to renal function.   Height: 5\' 9"  (175.3 cm) Weight: 199 lb 11.8 oz (90.6 kg) IBW/kg (Calculated) : 70.7  Temp (24hrs), Avg:98.8 F (37.1 C), Min:98.4 F (36.9 C), Max:99.1 F (37.3 C)   Recent Labs Lab 12/10/15 2353 12/11/15 0110 12/11/15 0544 12/12/15 0518 12/13/15 0402 12/14/15 0152  WBC 9.1  --  9.4  --  7.4 6.3  CREATININE 4.58*  --  4.74* 5.24* 5.54* 5.79*  LATICACIDVEN  --  1.1  --   --   --   --     Estimated Creatinine Clearance: 12.5 mL/min (by C-G formula based on SCr of 5.79 mg/dL (H)).    Allergies  Allergen Reactions  . Centrum Other (See Comments)    Bloating, indigestion, chest pain  . Valsartan Other (See Comments)    Unsure of symptoms, was on list from previous hospital and patient was unsure of name of medicine.  . Vitamin D Analogs Other (See Comments)    Bloating and swelling in abdomen region, indigestion. Prescription vit d not otc    Antimicrobials this admission: vanc 9/23 >> once cefepime 9/23 >>  levofloxacin 9/23 >>  Dose adjustments this admission: Cefepime 2 g was decreased to 1g IV q 24 due to renal function. Levofloxacin 750 q 24 was changed to 750 mg IV x 1 followed by 500 mg IV q 48 hours due to renal function. Levofloxacin 500 mg was changed from IV to PO  Microbiology results: 9/23 Sputum: sent  9/23 MRSA PCR: negative  Thank you for allowing pharmacy to be a part of this patient's care.  Darrow Bussing, PharmD Pharmacy Resident 12/14/2015 11:40 AM

## 2015-12-14 NOTE — Progress Notes (Signed)
Spoke with MD Ram and notified him that patient was having pink tinged urine in foley catheter. MD Ram stated to order urinalysis. Will continue to monitor patient.

## 2015-12-14 NOTE — Progress Notes (Signed)
ANTICOAGULATION CONSULT NOTE - Initial Consult  Pharmacy Consult for heparin bolus and drip Indication: chest pain/ACS  Allergies  Allergen Reactions  . Centrum Other (See Comments)    Bloating, indigestion, chest pain  . Valsartan Other (See Comments)    Unsure of symptoms, was on list from previous hospital and patient was unsure of name of medicine.  . Vitamin D Analogs Other (See Comments)    Bloating and swelling in abdomen region, indigestion. Prescription vit d not otc    Patient Measurements: Height: 5\' 9"  (175.3 cm) Weight: 199 lb 11.8 oz (90.6 kg) IBW/kg (Calculated) : 70.7 Heparin Dosing Weight: 88.4  Vital Signs: Temp: 99 F (37.2 C) (09/26 0000) Temp Source: Oral (09/26 0000) BP: 131/78 (09/25 1926) Pulse Rate: 87 (09/25 1800)  Labs:  Recent Labs  12/11/15 0544 12/12/15 0518 12/13/15 0402  12/13/15 1035 12/13/15 1128 12/13/15 1730 12/13/15 2253 12/14/15 0152  HGB 10.5*  --  10.8*  --   --   --   --   --  10.4*  HCT 30.2*  --  30.6*  --   --   --   --   --  30.2*  PLT 184  --  176  --   --   --   --   --  195  APTT  --   --   --   --   --  33  --  69*  --   LABPROT  --   --   --   --   --  13.0  --   --   --   INR  --   --   --   --   --  0.98  --   --   --   HEPARINUNFRC  --   --   --   --   --   --   --   --  0.25*  CREATININE 4.74* 5.24* 5.54*  --   --   --   --   --  5.79*  TROPONINI  --   --   --   < > 16.95*  --  10.50* 12.82*  --   < > = values in this interval not displayed.  Estimated Creatinine Clearance: 12.5 mL/min (by C-G formula based on SCr of 5.79 mg/dL (H)).   Medical History: Past Medical History:  Diagnosis Date  . 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 CKD (chronic kidney disease)   . Coronary artery disease   . CVA (cerebrovascular accident) ([place])   . Diabetes mellitus    lantus and novolog;fasting sugars run 70-120  . Hyperlipidemia    takes Crestor daily  . Hypertension    takes Amlodipine and Metoprolol  . Joint pain    knees,elbows,back  . PONV (postoperative nausea and vomiting)     Medications:  Scheduled:  . amLODipine  10 mg Oral Daily  . aspirin EC  81 mg Oral Daily  . carvedilol  6.25 mg Oral BID WC  . ceFEPime (MAXIPIME) IV  2 g Intravenous Q24H  . heparin  1,300 Units Intravenous Once  . insulin aspart  0-5 Units Subcutaneous QHS  . insulin aspart  0-9 Units Subcutaneous TID WC  . insulin glargine  8 Units Subcutaneous QHS  . [START ON 12/15/2015] levofloxacin (LEVAQUIN) IV  500 mg Intravenous Q48H  . mouth rinse  15 mL Mouth Rinse BID  . metolazone  10 mg Oral Daily  . rosuvastatin  20 mg Oral QHS  . senna-docusate  1 tablet Oral BID  . sodium bicarbonate  1,950 mg Oral TID   Infusions:  . furosemide (LASIX) infusion 8 mg/hr (12/14/15 0056)  . heparin 1,150 Units/hr (12/14/15 0000)    Assessment: Pharmacy has been consulted to dose heparin bolus and drip in this 55 yoM admitted on 9/22 with pna. Pt was found to have elevated troponin today (21.5).  Goal of Therapy:  Heparin level 0.3-0.7 units/ml Monitor platelets by anticoagulation protocol: Yes   Plan:  First heparin level subtherapeutic. Increase rate to 1300 units/hr. Will recheck HL in 8 hours.  Brazen Domangue A. [place], [place].D., BCPS Clinical Pharmacist 12/14/2015 2:56 AM

## 2015-12-14 NOTE — Progress Notes (Signed)
[facility] Cardiology  SUBJECTIVE: I don't have chest pain   Vitals:   12/14/15 0100 12/14/15 0237 12/14/15 0600 12/14/15 0753  BP: 130/68  138/74   Pulse: 76  87   Resp: 13  10   Temp:      TempSrc:      SpO2: 96% 97% 99% 94%  Weight:      Height:         Intake/Output Summary (Last 24 hours) at 12/14/15 0801 Last data filed at 12/14/15 0600  Gross per 24 hour  Intake            924.3 ml  Output             3125 ml  Net          -2200.7 ml      PHYSICAL EXAM  General: Well developed, well nourished, in no acute distress HEENT:  Normocephalic and atramatic Neck:  No JVD.  Lungs: Clear bilaterally to auscultation and percussion. Heart: HRRR . Normal S1 and S2 without gallops or murmurs.  Abdomen: Bowel sounds are positive, abdomen soft and non-tender  Msk:  Back normal, normal gait. Normal strength and tone for age. Extremities: No clubbing, cyanosis or edema.   Neuro: Alert and oriented X 3. Psych:  Good affect, responds appropriately   LABS: Basic Metabolic Panel:  Recent Labs  12/13/15 0402 12/14/15 0152  NA 140 140  K 3.7 3.9  CL 104 97*  CO2 28 31  GLUCOSE 163* 151*  BUN 75* 87*  CREATININE 5.54* 5.79*  CALCIUM 7.6* 7.4*  MG 1.5* 1.7  PHOS 5.9*  --    Liver Function Tests: No results for input(s): AST, ALT, ALKPHOS, BILITOT, PROT, ALBUMIN in the last 72 hours. No results for input(s): LIPASE, AMYLASE in the last 72 hours. CBC:  Recent Labs  12/13/15 0402 12/14/15 0152  WBC 7.4 6.3  HGB 10.8* 10.4*  HCT 30.6* 30.2*  MCV 92.8 92.2  PLT 176 195   Cardiac Enzymes:  Recent Labs  12/13/15 1035 12/13/15 1730 12/13/15 2253  TROPONINI 16.95* 10.50* 12.82*   BNP: Invalid input(s): POCBNP D-Dimer: No results for input(s): DDIMER in the last 72 hours. Hemoglobin A1C: No results for input(s): HGBA1C in the last 72 hours. Fasting Lipid Panel: No results for input(s): CHOL, HDL, LDLCALC, TRIG, CHOLHDL, LDLDIRECT in the last 72 hours. Thyroid  Function Tests: No results for input(s): TSH, T4TOTAL, T3FREE, THYROIDAB in the last 72 hours.  Invalid input(s): FREET3 Anemia Panel: No results for input(s): VITAMINB12, FOLATE, FERRITIN, TIBC, IRON, RETICCTPCT in the last 72 hours.  Ct Chest Wo Contrast  Result Date: 12/13/2015 CLINICAL DATA:  Difficulty breathing and requiring oxygen. EXAM: CT CHEST WITHOUT CONTRAST TECHNIQUE: Multidetector CT imaging of the chest was performed following the standard protocol without IV contrast. COMPARISON:  12/13/2015.  CT scan 12/13/2015 FINDINGS: Cardiovascular: Extensive coronary artery calcifications. Atherosclerotic calcifications in thoracic aorta with normal caliber. Atherosclerotic calcifications in the great vessels. Mediastinum/Nodes: Small lymph nodes throughout the mediastinum. Limited evaluation for hilar lymphadenopathy on this noncontrast examination. No axillary lymphadenopathy. Small amount of pericardial fluid. No gross abnormality to the esophagus. Lungs/Pleura: Trachea and mainstem bronchi are patent. Extensive ground-glass disease in the left lower lobe and posterior left upper lobe. Along with the ground-glass disease, there is confluent consolidation within the left upper and lower lobes. Similarly, there is ground-glass disease and consolidation in the medial right lower lobe. Also, airspace disease in the posterior right upper lobe. Small amount  of the disease in the anterior right upper lobe. Thickening along the right fissures. No pleural effusions. Upper Abdomen: Gallbladder has been removed. Perinephric stranding is probably chronic based on a CT from 01/24/2015. Splenic artery is heavily calcified. Concern for thickening of the left hemidiaphragm on sequence 2 image 129. Musculoskeletal: Subcutaneous edema in the chest. Old left posterior rib fractures. IMPRESSION: Bilateral airspace disease, left side greater than right. Findings are suggestive for bilateral pneumonia. Subcutaneous  edema. Thickening along the posterior left hemidiaphragm is nonspecific but may be related to the adjacent lung disease and subcutaneous edema. Electronically Signed   By: Markus Daft M.D.   On: 12/13/2015 13:36   Nm Pulmonary Perf And Vent  Result Date: 12/13/2015 CLINICAL DATA:  Dyspnea. EXAM: NUCLEAR MEDICINE VENTILATION - PERFUSION LUNG SCAN TECHNIQUE: Ventilation images were obtained in multiple projections using inhaled aerosol Tc-41m DTPA. Perfusion images were obtained in multiple projections after intravenous injection of Tc-21m MAA. RADIOPHARMACEUTICALS:  28.75 mCi Technetium-18m DTPA aerosol inhalation and 3.91 mCi Technetium-56m MAA IV COMPARISON:  Chest CT 12/13/2015 and chest radiograph 12/13/2015 FINDINGS: Ventilation: Significant clumping of the radiopharmaceutical in the hilar regions, particularly in the left hilum. Decreased uptake in the left lung compared to the right. Perfusion: No wedge-shaped peripheral perfusion defects. There is slightly decreased uptake in the left lung compared to the right but this is a matched defect with the ventilation findings. In addition, there is extensive airspace disease in the left lung based on the chest radiograph. IMPRESSION: No evidence for pulmonary embolism. Electronically Signed   By: Markus Daft M.D.   On: 12/13/2015 13:21   Dg Chest Port 1 View  Result Date: 12/13/2015 CLINICAL DATA:  Central line placement. Bilateral airspace opacities in the lungs on CT. EXAM: PORTABLE CHEST 1 VIEW COMPARISON:  12/13/2015 CT scan FINDINGS: Right internal jugular center venous catheter tip: SVC. No pneumothorax. Airspace opacities are observed primarily in the left lung, with only very vague hazy density in the right lung. The appearance is similar to that noted on today' s chest CT. Thoracic spondylosis. Old healed left posterolateral rib fractures. Atherosclerotic calcification of the aortic arch. IMPRESSION: 1. Right IJ line tip:  SVC.  No pneumothorax. 2.  Stable appearance of airspace opacities in the lungs, left greater than right. 3. Atherosclerotic calcification of the aortic arch. 4. Thoracic spondylosis. Electronically Signed   By: Van Clines M.D.   On: 12/13/2015 17:07   Dg Chest Port 1 View  Result Date: 12/13/2015 CLINICAL DATA:  Pneumonia. EXAM: PORTABLE CHEST 1 VIEW COMPARISON:  12/11/2015 . FINDINGS: Mediastinum heart size stable. Interim progression of left perihilar and left lower lobe infiltrate. Mild right mid lung field subsegmental atelectasis. No pleural effusion or pneumothorax . IMPRESSION: 1. Interim progression of left perihilar and left lower lobe infiltrate. 2. Right mid lung field subsegmental atelectasis. 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 Non-ST elevation myocardial infarction, in the absence of chest pain or ECG changes, in the setting of respiratory failure and bilateral pneumonia 2.  Mild ischemic cardiomyopathy 3. Bilateral pneumonia 4. End-stage renal disease, awaiting peritoneal dialysis  Recommendations  1. Continue current therapy 2. Defer full dose anticoagulation in the absence of chest pain or evidence for ongoing ischemia 3. Defer urgent cardiac catheterization. May consider cardiac catheterization later this hospitalization after respiratory status and pneumonia resolved, or consider at a later date since patient currently denies chest pain, and has underlying end-stage renal disease. We will confer with Dr. Holley Raring about potential cardiac catheterization and the  need for potential dialysis.   Isaias Cowman, MD, PhD, [facility] 12/14/2015 8:01 AM

## 2015-12-14 NOTE — Progress Notes (Signed)
Spoke with MD Kasa about feeling concerned about patient. Patient continues to have pink tinged urine in foley and is coughing up bloody sputum. Also felt as though patient was decompensating as far as respiratory status. Patient was not as talkative and alert and was feeling nauseated and continuously dry heaving that was not resolved with zofran. ABG, cxr and one dose of morphine were ordered. See new orders. Will continue to monitor patient.

## 2015-12-14 NOTE — Care Management (Signed)
Spoke with nephrology in regards to need for dialysis.  At present time, there is no indication for need.

## 2015-12-14 NOTE — Progress Notes (Addendum)
[facility] Progess Note    ASSESSMENT/PLAN   DISCUSSION: 75 year old male with a history of hypertension, type 2 diabetes, coronary artery disease, chronic kidney disease, hyperlipidemia, and joint pain presenting with acute hypoxic respiratory failure, status post peritoneal dialysis catheter placement likely due to pneumonia complicated by NSTEMI and volume overload.   ASSESSMENT / PLAN:  PULMONARY A: Acute hypoxic respiratory failure. Severe Community-acquired pneumonia   P:   VQ scan negative CT chest without contrast: images reviewed c/w pneumonia.  Continuous hi-flow O2.  Continue broad spectrum abx.    CARDIOVASCULAR A:  Tachycardia Worsening edema-rule out CHF NSTEMI P:  D/w cardiology, continue current management, will consider cath once pt is recovered from pneumonia, will need to consider dialysis needs as well.  Continue home dose of statin and antihypertensives  Aspirin daily. Furosemide infusion per nephrology 2-D to reevaluate left ventricular ejection fraction   RENAL A:   Chronic kidney disease stage V Hyperkalemia  P:   Nephrology consulted and following Monitor and correct electrolytes. Lasix infusion per nephrology Monitor intake and output as well as urine output  GASTROINTESTINAL A:   No acute issues  P:   Diabetic/cardiac diet as tolerated   HEMATOLOGIC A:   Anemia of chronic disease  P:  Trend hemoglobin and hematocrit Transfuse if hemoglobin less than 7. Heparin for DVT prophylaxis   INFECTIOUS A:   Community-acquired pneumonia without sepsis  P:   Continue cefepime levaquin.  Daily chest x-ray   CULTURES: MRSA screen negative   ANTIBIOTICS: Cefepime 12/11/2015 >> Levaquin 9/25>>  ENDOCRINE A:   Type 2 diabetes mellitus   P:   Blood glucose monitoring with sliding scale insulin coverage   NEUROLOGIC A:   History of CVA  P:   RASS goal: Nonapplicable Monitor neurological  status closely Optimize blood pressure and diabetes and lipid control  MAJOR EVENTS/TEST RESULTS: 12/10/2015: Peritoneal dialysis catheter placed at [facility]; later that evening, patient presents in the ED with hypoxia and dyspnea; admitted with community-acquired pneumonia, MI: Developed worsening respiratory distress and was transferred to the ICU on 12/11/2015.  Best Practices  DVT Prophylaxis: heparin GI Prophylaxis:--   ---------------------------------------   ----------------------------------------   Name: Todd [name] MRN: [id number] DOB: 05-Sep-1940    ADMISSION DATE:  12/10/2015  SUBJECTIVE:   Pt currently on hi-flow oxygen; appears in no distress.   Review of Systems:  Constitutional: Feels well. Cardiovascular: No chest pain.  Pulmonary: Denies dyspnea.   The remainder of systems were reviewed and were found to be negative other than what is documented in the HPI.    VITAL SIGNS: Temp:  [98.6 F (37 C)-99.1 F (37.3 C)] 99 F (37.2 C) (09/26 0000) Pulse Rate:  [74-90] 87 (09/26 0600) Resp:  [10-22] 10 (09/26 0600) BP: (127-156)/(57-78) 138/74 (09/26 0600) SpO2:  [92 %-100 %] 94 % (09/26 0753) FiO2 (%):  [48 %-70 %] 48 % (09/26 0753) HEMODYNAMICS:   VENTILATOR SETTINGS: FiO2 (%):  [48 %-70 %] 48 % INTAKE / OUTPUT:  Intake/Output Summary (Last 24 hours) at 12/14/15 0842 Last data filed at 12/14/15 0600  Gross per 24 hour  Intake            924.3 ml  Output             3125 ml  Net          -2200.7 ml    PHYSICAL EXAMINATION: Physical Examination:   VS: BP 138/74   Pulse 87   Temp  99 F (37.2 C) (Oral)   Resp 10   Ht 5\' 9"  (1.753 m)   Wt 90.6 kg (199 lb 11.8 oz)   SpO2 94%   BMI 29.50 kg/m   General Appearance: No distress  Neuro:without focal findings, mental status normal. HEENT: PERRLA, EOM intact. Pulmonary: normal breath sounds   CardiovascularNormal S1,S2.  No m/r/g.   Abdomen: Benign, Soft, non-tender. Renal:  No costovertebral  tenderness  GU:  Not performed at this time. Endocrine: No evident thyromegaly. Skin:   warm, no rashes, no ecchymosis  Extremities: normal, no cyanosis, clubbing.   LABS:   LABORATORY PANEL:   CBC  Recent Labs Lab 12/14/15 0152  WBC 6.3  HGB 10.4*  HCT 30.2*  PLT 195    Chemistries   Recent Labs Lab 12/10/15 2353  12/13/15 0402 12/14/15 0152  NA 142  < > 140 140  K 5.3*  < > 3.7 3.9  CL 115*  < > 104 97*  CO2 16*  < > 28 31  GLUCOSE 173*  < > 163* 151*  BUN 71*  < > 75* 87*  CREATININE 4.58*  < > 5.54* 5.79*  CALCIUM 6.9*  < > 7.6* 7.4*  MG  --   < > 1.5* 1.7  PHOS  --   --  5.9*  --   AST 25  --   --   --   ALT 16*  --   --   --   ALKPHOS 70  --   --   --   BILITOT 0.7  --   --   --   < > = values in this interval not displayed.   Recent Labs Lab 12/12/15 2106 12/13/15 0718 12/13/15 1116 12/13/15 1617 12/13/15 2109 12/14/15 0721  GLUCAP 203* 170* 209* 222* 235* 175*    Recent Labs Lab 12/11/15 0024  PHART 7.22*  PCO2ART 43  PO2ART <31.0*    Recent Labs Lab 12/10/15 2353  AST 25  ALT 16*  ALKPHOS 70  BILITOT 0.7  ALBUMIN 3.2*    Cardiac Enzymes  Recent Labs Lab 12/13/15 2253  TROPONINI 12.82*    RADIOLOGY:  Ct Chest Wo Contrast  Result Date: 12/13/2015 CLINICAL DATA:  Difficulty breathing and requiring oxygen. EXAM: CT CHEST WITHOUT CONTRAST TECHNIQUE: Multidetector CT imaging of the chest was performed following the standard protocol without IV contrast. COMPARISON:  12/13/2015.  CT scan 12/13/2015 FINDINGS: Cardiovascular: Extensive coronary artery calcifications. Atherosclerotic calcifications in thoracic aorta with normal caliber. Atherosclerotic calcifications in the great vessels. Mediastinum/Nodes: Small lymph nodes throughout the mediastinum. Limited evaluation for hilar lymphadenopathy on this noncontrast examination. No axillary lymphadenopathy. Small amount of pericardial fluid. No gross abnormality to the esophagus.  Lungs/Pleura: Trachea and mainstem bronchi are patent. Extensive ground-glass disease in the left lower lobe and posterior left upper lobe. Along with the ground-glass disease, there is confluent consolidation within the left upper and lower lobes. Similarly, there is ground-glass disease and consolidation in the medial right lower lobe. Also, airspace disease in the posterior right upper lobe. Small amount of the disease in the anterior right upper lobe. Thickening along the right fissures. No pleural effusions. Upper Abdomen: Gallbladder has been removed. Perinephric stranding is probably chronic based on a CT from 01/24/2015. Splenic artery is heavily calcified. Concern for thickening of the left hemidiaphragm on sequence 2 image 129. Musculoskeletal: Subcutaneous edema in the chest. Old left posterior rib fractures. IMPRESSION: Bilateral airspace disease, left side greater than right. Findings are  suggestive for bilateral pneumonia. Subcutaneous edema. Thickening along the posterior left hemidiaphragm is nonspecific but may be related to the adjacent lung disease and subcutaneous edema. Electronically Signed   By: Markus Daft M.D.   On: 12/13/2015 13:36   Nm Pulmonary Perf And Vent  Result Date: 12/13/2015 CLINICAL DATA:  Dyspnea. EXAM: NUCLEAR MEDICINE VENTILATION - PERFUSION LUNG SCAN TECHNIQUE: Ventilation images were obtained in multiple projections using inhaled aerosol Tc-74m DTPA. Perfusion images were obtained in multiple projections after intravenous injection of Tc-73m MAA. RADIOPHARMACEUTICALS:  28.75 mCi Technetium-61m DTPA aerosol inhalation and 3.91 mCi Technetium-42m MAA IV COMPARISON:  Chest CT 12/13/2015 and chest radiograph 12/13/2015 FINDINGS: Ventilation: Significant clumping of the radiopharmaceutical in the hilar regions, particularly in the left hilum. Decreased uptake in the left lung compared to the right. Perfusion: No wedge-shaped peripheral perfusion defects. There is slightly  decreased uptake in the left lung compared to the right but this is a matched defect with the ventilation findings. In addition, there is extensive airspace disease in the left lung based on the chest radiograph. IMPRESSION: No evidence for pulmonary embolism. Electronically Signed   By: Markus Daft M.D.   On: 12/13/2015 13:21   Dg Chest Port 1 View  Result Date: 12/13/2015 CLINICAL DATA:  Central line placement. Bilateral airspace opacities in the lungs on CT. EXAM: PORTABLE CHEST 1 VIEW COMPARISON:  12/13/2015 CT scan FINDINGS: Right internal jugular center venous catheter tip: SVC. No pneumothorax. Airspace opacities are observed primarily in the left lung, with only very vague hazy density in the right lung. The appearance is similar to that noted on today' s chest CT. Thoracic spondylosis. Old healed left posterolateral rib fractures. Atherosclerotic calcification of the aortic arch. IMPRESSION: 1. Right IJ line tip:  SVC.  No pneumothorax. 2. Stable appearance of airspace opacities in the lungs, left greater than right. 3. Atherosclerotic calcification of the aortic arch. 4. Thoracic spondylosis. Electronically Signed   By: Van Clines M.D.   On: 12/13/2015 17:07   Dg Chest Port 1 View  Result Date: 12/13/2015 CLINICAL DATA:  Pneumonia. EXAM: PORTABLE CHEST 1 VIEW COMPARISON:  12/11/2015 . FINDINGS: Mediastinum heart size stable. Interim progression of left perihilar and left lower lobe infiltrate. Mild right mid lung field subsegmental atelectasis. No pleural effusion or pneumothorax . IMPRESSION: 1. Interim progression of left perihilar and left lower lobe infiltrate. 2. Right mid lung field subsegmental atelectasis. Electronically Signed   By: Marcello Moores  Register   On: 12/13/2015 07:09       --Marda Stalker, MD.  ICU Pager: [phone] [facility] Office Number: [phone]  Patricia Pesa, M.D.  Vilinda Boehringer, M.D.  Merton Border, M.D  12/14/2015    Critical Care Attestation.  I have personally obtained a history, examined the patient, evaluated laboratory and imaging results, formulated the assessment and plan and placed orders. The Patient requires high complexity decision making for assessment and support, frequent evaluation and titration of therapies, application of advanced monitoring technologies and extensive interpretation of multiple databases. The patient has critical illness that could lead imminently to failure of 1 or more organ systems and requires the highest level of physician preparedness to intervene.  Critical Care Time devoted to patient care services described in this note is 40 minutes and is exclusive of time spent in procedures.

## 2015-12-14 NOTE — Progress Notes (Signed)
Placed patient on bsc to have a BM, placed call bell within reach for patient to press when finished. Patient called to say he was finished, RN and NA walked into room and patient was slumped over on BSC, eyes were rolled back in head, patient was making spastic jerking movements, HR was in low 40's. Called MD Ram into patients room immediately and in the process patient woke up and heart rate came back into 60's. MD Ram stated he contributed these symptoms to a syncopal episode and patient may have caused a vagal response when bearing down for a BM. No new orders at this time. Will continue to monitor patient.

## 2015-12-14 NOTE — Progress Notes (Signed)
[place] consulted for assistance in electrolyte and constipation management in this 43 yoM admitted with PNA.  1. Electrolytes: 9/26 am labs: K = 3.9. Mag = 1.7. Pt has poor renal function (CrCl = 12.5) and not currently receiving dialysis. Will not replace mag at this time. F/u with am labs  2. Constipation: Pt last documented BM on 9/21. Received three doses of senna/docusate bid. Will continue to monitor.   Allergies  Allergen Reactions  . Centrum Other (See Comments)    Bloating, indigestion, chest pain  . Valsartan Other (See Comments)    Unsure of symptoms, was on list from previous hospital and patient was unsure of name of medicine.  . Vitamin D Analogs Other (See Comments)    Bloating and swelling in abdomen region, indigestion. Prescription vit d not otc    Patient Measurements: Height: 5\' 9"  (175.3 cm) Weight: 199 lb 11.8 oz (90.6 kg) IBW/kg (Calculated) : 70.7   Vital Signs: Temp: 98.4 F (36.9 C) (09/26 0800) Temp Source: Oral (09/26 0800) BP: 148/70 (09/26 0800) Pulse Rate: 79 (09/26 0800) Intake/Output from previous day: 09/25 0701 - 09/26 0700 In: 935.4 [P.O.:360; I.V.:275.4; IV Piggyback:300] Out: [id number] [Urine:3475] Intake/Output from this shift: Total I/O In: 42 [I.V.:42] Out: 400 [Urine:400]  Labs:  Recent Labs  12/13/15 0402 12/13/15 1128 12/13/15 2253 12/14/15 0152  WBC 7.4  --   --  6.3  HGB 10.8*  --   --  10.4*  HCT 30.6*  --   --  30.2*  PLT 176  --   --  195  APTT  --  33 69*  --   INR  --  0.98  --   --      Recent Labs  12/12/15 0518 12/13/15 0402 12/14/15 0152  NA 142 140 140  K 4.8 3.7 3.9  CL 111 104 97*  CO2 23 28 31   GLUCOSE 182* 163* 151*  BUN 72* 75* 87*  CREATININE 5.24* 5.54* 5.79*  CALCIUM 7.5* 7.6* 7.4*  MG  --  1.5* 1.7  PHOS  --  5.9*  --    Estimated Creatinine Clearance: 12.5 mL/min (by C-G formula based on SCr of 5.79 mg/dL (H)).    Recent Labs  12/13/15 2109  12/14/15 0721 12/14/15 1117  GLUCAP 235* 175* 280*    Medications:  Scheduled:  . amLODipine  10 mg Oral Daily  . aspirin EC  81 mg Oral Daily  . carvedilol  6.25 mg Oral BID WC  . ceFEPime (MAXIPIME) IV  1 g Intravenous Q24H  . heparin subcutaneous  5,000 Units Subcutaneous Q8H  . insulin aspart  0-5 Units Subcutaneous QHS  . insulin aspart  0-9 Units Subcutaneous TID WC  . insulin glargine  8 Units Subcutaneous QHS  . [START ON 12/15/2015] levofloxacin (LEVAQUIN) IV  500 mg Intravenous Q48H  . mouth rinse  15 mL Mouth Rinse BID  . metolazone  10 mg Oral Daily  . polyethylene glycol  17 g Oral BID  . rosuvastatin  20 mg Oral QHS  . senna-docusate  1 tablet Oral BID  . sodium bicarbonate  1,950 mg Oral TID   Infusions:  . furosemide (LASIX) infusion 8 mg/hr (12/14/15 0800)    Darrow Bussing, PharmD Pharmacy Resident 12/14/2015 11:48 AM

## 2015-12-14 NOTE — Progress Notes (Addendum)
Significant Event:   Pt was on the bedside commode when it was noted that the patient was leaned over to his left side, his eyes had rolled back in his head, he was non-responsive.  At that time, his heart rate had dropped to 50's, then came back up. He became awake and was able to respond and answer questions the total episode lasted 20-40 seconds. There was no seizure activity noted.   The patient was then moved back to bed. Subsequently the patient had a coughing episode and spit up 1 teaspoon of bright bloody mucus.   A: Syncopal episode, likely due to vasovagal response as the patient was straining , but was not able to have a bowel movement.   P:  Continue bowel regimen.  Xray abd.  Pt should remain in bed and use bedpan.  May be transferred to chair using lift only.   Ashby Dawes, M.D.  12/14/2015

## 2015-12-14 NOTE — Progress Notes (Signed)
PHARMACIST - PHYSICIAN COMMUNICATION DR:   Ashby Dawes CONCERNING: Antibiotic IV to Oral Route Change Policy  RECOMMENDATION: This patient is receiving levofloxacin 500 mg by the intravenous route.  Based on criteria approved by the Pharmacy and Therapeutics Committee, the antibiotic(s) is/are being converted to the equivalent oral dose form(s).   DESCRIPTION: These criteria include:  Patient being treated for a respiratory tract infection, urinary tract infection, cellulitis or clostridium difficile associated diarrhea if on metronidazole  The patient is not neutropenic and does not exhibit a GI malabsorption state  The patient is eating (either orally or via tube) and/or has been taking other orally administered medications for a least 24 hours  The patient is improving clinically and has a Tmax < 100.5  If you have questions about this conversion, please contact the [place], PharmD Pharmacy Resident 12/14/2015 11:57 AM

## 2015-12-14 NOTE — Progress Notes (Signed)
[facility] [place] NAME: Todd [name]    MR#:  [id number]  DATE OF BIRTH:  01-19-1941  SUBJECTIVE:  CHIEF COMPLAINT:   Chief Complaint  Patient presents with  . Shortness of Breath   - feels about the same, denies any chest pain - troponins trending down. On lasix drip and heparin drip - syncopal episode with straining- vasovagal - remains on high flow nasal cannula  REVIEW OF SYSTEMS:  Review of Systems  Constitutional: Negative for chills, fever and malaise/fatigue.  HENT: Negative for ear discharge and ear pain.   Eyes: Negative for blurred vision and double vision.  Respiratory: Positive for hemoptysis and shortness of breath. Negative for cough and wheezing.   Cardiovascular: Negative for chest pain, palpitations and leg swelling.  Gastrointestinal: Negative for abdominal pain, constipation, diarrhea, nausea and vomiting.  Genitourinary: Negative for dysuria and urgency.  Musculoskeletal: Negative for myalgias.  Neurological: Negative for dizziness, sensory change, speech change, focal weakness, seizures and headaches.  Psychiatric/Behavioral: Negative for depression.    DRUG ALLERGIES:   Allergies  Allergen Reactions  . Centrum Other (See Comments)    Bloating, indigestion, chest pain  . Valsartan Other (See Comments)    Unsure of symptoms, was on list from previous hospital and patient was unsure of name of medicine.  . Vitamin D Analogs Other (See Comments)    Bloating and swelling in abdomen region, indigestion. Prescription vit d not otc    VITALS:  Blood pressure (!) 148/70, pulse 79, temperature 98.4 F (36.9 C), temperature source Oral, resp. rate 11, height 5\' 9"  (1.753 m), weight 90.6 kg (199 lb 11.8 oz), SpO2 90 %.  PHYSICAL EXAMINATION:  Physical Exam  GENERAL:  75 y.o.-year-old patient lying in the bed with no acute distress. On high flow nasal cannula, not tachypneic EYES: Pupils equal, round,  reactive to light and accommodation. No scleral icterus. Extraocular muscles intact.  HEENT: Head atraumatic, normocephalic. Oropharynx and nasopharynx clear.  NECK:  Supple, no jugular venous distention. No thyroid enlargement, no tenderness.  LUNGS: Normal breath sounds bilaterally, improved wheezing, no rales,rhonchi or crepitation. No use of accessory muscles of respiration. Decreased bibasilar breath sounds noted CARDIOVASCULAR: S1, S2 normal. No murmurs, rubs, or gallops.  ABDOMEN: Soft, nontender, nondistended. Bowel sounds present. No organomegaly or mass. Peritoneal dialysis catheter present EXTREMITIES: No pedal edema, cyanosis, or clubbing.  NEUROLOGIC: Cranial nerves II through XII are intact. Muscle strength 5/5 in all extremities. Sensation intact. Gait not checked.  PSYCHIATRIC: The patient is alert and oriented x 3.  SKIN: No obvious rash, lesion, or ulcer.    LABORATORY PANEL:   CBC  Recent Labs Lab 12/14/15 0152  WBC 6.3  HGB 10.4*  HCT 30.2*  PLT 195   ------------------------------------------------------------------------------------------------------------------  Chemistries   Recent Labs Lab 12/10/15 2353  12/14/15 0152  NA 142  < > 140  K 5.3*  < > 3.9  CL 115*  < > 97*  CO2 16*  < > 31  GLUCOSE 173*  < > 151*  BUN 71*  < > 87*  CREATININE 4.58*  < > 5.79*  CALCIUM 6.9*  < > 7.4*  MG  --   < > 1.7  AST 25  --   --   ALT 16*  --   --   ALKPHOS 70  --   --   BILITOT 0.7  --   --   < > = values in this interval  not displayed. ------------------------------------------------------------------------------------------------------------------  Cardiac Enzymes  Recent Labs Lab 12/13/15 2253  TROPONINI 12.82*   ------------------------------------------------------------------------------------------------------------------  RADIOLOGY:  Dg Abd 1 View  Result Date: 12/14/2015 CLINICAL DATA:  75 year old presenting with excessive straining  while attempting to have a bowel movement. Acute onset of nausea and vomiting. Current history of diabetes and chronic kidney disease. EXAM: ABDOMEN - 1 VIEW COMPARISON:  01/26/2015, 01/24/2015 and earlier, including CT abdomen pelvis 01/24/2015. FINDINGS: Bowel gas pattern unremarkable without evidence of obstruction or significant ileus. Moderate stool burden in the colon. Peritoneal dialysis catheter loop in the midline of the pelvis. 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 Severe degenerative changes involving the left hip with complete loss of the joint space. Moderate degenerative changes involving the lumbar spine. IMPRESSION: 1. No acute abdominal abnormality.  Moderate colonic stool burden. 2. Aortoiliofemoral atherosclerosis. 3. Severe osteoarthritis involving the left hip. Electronically Signed   By: Evangeline Dakin M.D.   On: 12/14/2015 11:33   Ct Chest Wo Contrast  Result Date: 12/13/2015 CLINICAL DATA:  Difficulty breathing and requiring oxygen. EXAM: CT CHEST WITHOUT CONTRAST TECHNIQUE: Multidetector CT imaging of the chest was performed following the standard protocol without IV contrast. COMPARISON:  12/13/2015.  CT scan 12/13/2015 FINDINGS: Cardiovascular: Extensive coronary artery calcifications. Atherosclerotic calcifications in thoracic aorta with normal caliber. Atherosclerotic calcifications in the great vessels. Mediastinum/Nodes: Small lymph nodes throughout the mediastinum. Limited evaluation for hilar lymphadenopathy on this noncontrast examination. No axillary lymphadenopathy. Small amount of pericardial fluid. No gross abnormality to the esophagus. Lungs/Pleura: Trachea and mainstem bronchi are patent. Extensive ground-glass disease in the left lower lobe and posterior left upper lobe. Along with the ground-glass disease, there is confluent consolidation within the left upper and lower lobes. Similarly, there is ground-glass disease  and consolidation in the medial right lower lobe. Also, airspace disease in the posterior right upper lobe. Small amount of the disease in the anterior right upper lobe. Thickening along the right fissures. No pleural effusions. Upper Abdomen: Gallbladder has been removed. Perinephric stranding is probably chronic based on a CT from 01/24/2015. Splenic artery is heavily calcified. Concern for thickening of the left hemidiaphragm on sequence 2 image 129. Musculoskeletal: Subcutaneous edema in the chest. Old left posterior rib fractures. IMPRESSION: Bilateral airspace disease, left side greater than right. Findings are suggestive for bilateral pneumonia. Subcutaneous edema. Thickening along the posterior left hemidiaphragm is nonspecific but may be related to the adjacent lung disease and subcutaneous edema. Electronically Signed   By: Markus Daft M.D.   On: 12/13/2015 13:36   Nm Pulmonary Perf And Vent  Result Date: 12/13/2015 CLINICAL DATA:  Dyspnea. EXAM: NUCLEAR MEDICINE VENTILATION - PERFUSION LUNG SCAN TECHNIQUE: Ventilation images were obtained in multiple projections using inhaled aerosol Tc-49m DTPA. Perfusion images were obtained in multiple projections after intravenous injection of Tc-60m MAA. RADIOPHARMACEUTICALS:  28.75 mCi Technetium-40m DTPA aerosol inhalation and 3.91 mCi Technetium-37m MAA IV COMPARISON:  Chest CT 12/13/2015 and chest radiograph 12/13/2015 FINDINGS: Ventilation: Significant clumping of the radiopharmaceutical in the hilar regions, particularly in the left hilum. Decreased uptake in the left lung compared to the right. Perfusion: No wedge-shaped peripheral perfusion defects. There is slightly decreased uptake in the left lung compared to the right but this is a matched defect with the ventilation findings. In addition, there is extensive airspace disease in the left lung based on the chest radiograph. IMPRESSION: No evidence for pulmonary embolism. Electronically Signed   By: Markus Daft M.D.   On:  12/13/2015 13:21   Dg Chest Port 1 View  Result Date: 12/13/2015 CLINICAL DATA:  Central line placement. Bilateral airspace opacities in the lungs on CT. EXAM: PORTABLE CHEST 1 VIEW COMPARISON:  12/13/2015 CT scan FINDINGS: Right internal jugular center venous catheter tip: SVC. No pneumothorax. Airspace opacities are observed primarily in the left lung, with only very vague hazy density in the right lung. The appearance is similar to that noted on today' s chest CT. Thoracic spondylosis. Old healed left posterolateral rib fractures. Atherosclerotic calcification of the aortic arch. IMPRESSION: 1. Right IJ line tip:  SVC.  No pneumothorax. 2. Stable appearance of airspace opacities in the lungs, left greater than right. 3. Atherosclerotic calcification of the aortic arch. 4. Thoracic spondylosis. Electronically Signed   By: Van Clines M.D.   On: 12/13/2015 17:07   Dg Chest Port 1 View  Result Date: 12/13/2015 CLINICAL DATA:  Pneumonia. EXAM: PORTABLE CHEST 1 VIEW COMPARISON:  12/11/2015 . FINDINGS: Mediastinum heart size stable. Interim progression of left perihilar and left lower lobe infiltrate. Mild right mid lung field subsegmental atelectasis. No pleural effusion or pneumothorax . IMPRESSION: 1. Interim progression of left perihilar and left lower lobe infiltrate. 2. Right mid lung field subsegmental atelectasis. Electronically Signed   By: Marcello Moores  Register   On: 12/13/2015 07:09    EKG:   Orders placed or performed during the hospital encounter of 12/10/15  . ED EKG within 10 minutes  . ED EKG within 10 minutes  . EKG 12-Lead  . 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  #1 acute hypoxic respiratory  failure-secondary to pneumonia and pulmonary edema -remains on high flow nasal cannula. Today at 50% fio2, 40L flow rate, Continue to monitor in ICU stepdown - clinically improving slowly, encourage incentive spirometry -Blood cultures are not done on admission. 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 - continue heparin for 48hrs, but stopped today after 24hrs per cardiology recommendations. 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 Has peritoneal dialysis catheter. Plan was to start dialysis in a month after teaching is finished with PD catheter -Nephrology consulted.  - remains on lasix drip, voiding well. Also on metolazone - no acute indication to start dialysis now- but if needs this adm- will need a permacath  #4 DM- on SSI and low dose lantus, novolog tid started  #5 hypertension-on Norvasc and Coreg  #6 DVT Prophylaxis- SQ heparin   Physical therapy consult, once off of high flow.   All the records are reviewed and case discussed with Care Management/Social Workerr. Management plans discussed with the patient, family and they are in agreement.  CODE STATUS: Full code  TOTAL CRITICAL CARE TIME SPENT IN TAKING CARE  OF THIS PATIENT: 39 minutes.   POSSIBLE D/C IN 2-3 DAYS, DEPENDING ON CLINICAL CONDITION.   Gladstone Lighter M.D on 12/14/2015 at 1:54 PM  Between 7am to 6pm - Pager - [phone]  After 6pm go  to [email or web address] - password [email or web address] [facility]  Office  [phone]  CC: Primary care physician; Leonel Ramsay, MD

## 2015-12-15 LAB — CBC
HCT: 29.4 % — ABNORMAL LOW (ref 40.0–52.0)
Hemoglobin: 10.6 g/dL — ABNORMAL LOW (ref 13.0–18.0)
MCH: 33.1 pg (ref 26.0–34.0)
MCHC: 36.2 g/dL — AB (ref 32.0–36.0)
MCV: 91.3 fL (ref 80.0–100.0)
PLATELETS: 204 10*3/uL (ref 150–440)
RBC: 3.22 MIL/uL — ABNORMAL LOW (ref 4.40–5.90)
RDW: 12.5 % (ref 11.5–14.5)
WBC: 4.8 10*3/uL (ref 3.8–10.6)

## 2015-12-15 LAB — BASIC METABOLIC PANEL
Anion gap: 15 (ref 5–15)
BUN: 98 mg/dL — AB (ref 6–20)
CALCIUM: 7.8 mg/dL — AB (ref 8.9–10.3)
CO2: 33 mmol/L — ABNORMAL HIGH (ref 22–32)
CREATININE: 6.83 mg/dL — AB (ref 0.61–1.24)
Chloride: 93 mmol/L — ABNORMAL LOW (ref 101–111)
GFR, EST AFRICAN AMERICAN: 8 mL/min — AB (ref >60)
GFR, EST NON AFRICAN AMERICAN: 7 mL/min — AB (ref >60)
Glucose, Bld: 192 mg/dL — ABNORMAL HIGH (ref 65–99)
Potassium: 3.6 mmol/L (ref 3.5–5.1)
SODIUM: 141 mmol/L (ref 135–145)

## 2015-12-15 LAB — LEGIONELLA PNEUMOPHILA SEROGP 1 UR AG: L. pneumophila Serogp 1 Ur Ag: NEGATIVE

## 2015-12-15 LAB — GLUCOSE, CAPILLARY
GLUCOSE-CAPILLARY: 210 mg/dL — AB (ref 65–99)
GLUCOSE-CAPILLARY: 181 mg/dL — AB (ref 65–99)
Glucose-Capillary: 242 mg/dL — ABNORMAL HIGH (ref 65–99)
Glucose-Capillary: 188 mg/dL — ABNORMAL HIGH (ref 65–99)

## 2015-12-15 LAB — PHOSPHORUS: Phosphorus: 5.9 mg/dL — ABNORMAL HIGH (ref 2.5–4.6)

## 2015-12-15 LAB — MAGNESIUM: MAGNESIUM: 1.6 mg/dL — AB (ref 1.7–2.4)

## 2015-12-15 MED ORDER — SODIUM CHLORIDE 0.9% FLUSH: 10.0000 mL | INTRAVENOUS | Status: DC | PRN

## 2015-12-15 MED ORDER — SODIUM CHLORIDE 0.9% FLUSH
10.0000 mL | Freq: Two times a day (BID) | INTRAVENOUS | Status: DC
  Administered 2015-12-15 – 2015-12-16 (×2): 10 mL
  Administered 2015-12-16: 30 mL
  Administered 2015-12-17: 20 mL
  Administered 2015-12-17: 30 mL
  Administered 2015-12-18: 10 mL
  Administered 2015-12-18 – 2015-12-19 (×2): 30 mL
  Administered 2015-12-19: 10 mL
  Administered 2015-12-20: 30 mL
  Administered 2015-12-21 – 2015-12-22 (×2): 10 mL
  Administered 2015-12-23: 20 mL
  Administered 2015-12-23: 30 mL
  Administered 2015-12-24: 10 mL
  Administered 2015-12-24: 30 mL

## 2015-12-15 MED ORDER — FUROSEMIDE 10 MG/ML IJ SOLN
60.0000 mg | Freq: Every day | INTRAMUSCULAR | Status: DC
  Administered 2015-12-15 – 2015-12-16 (×2): 60 mg via INTRAVENOUS
  Filled 2015-12-15 (×2): qty 6

## 2015-12-15 MED ORDER — SENNOSIDES-DOCUSATE SODIUM 8.6-50 MG PO TABS
2.0000 | ORAL_TABLET | Freq: Two times a day (BID) | ORAL | Status: DC
  Administered 2015-12-15 – 2015-12-24 (×18): 2 via ORAL
  Filled 2015-12-15 (×18): qty 2

## 2015-12-15 MED ORDER — MAGNESIUM SULFATE IN D5W 1-5 GM/100ML-% IV SOLN
1.0000 g | Freq: Once | INTRAVENOUS | Status: AC
  Administered 2015-12-15: 1 g via INTRAVENOUS
  Filled 2015-12-15: qty 100

## 2015-12-15 NOTE — Progress Notes (Signed)
[facility] at [place] NAME: Todd [name]    MR#:  [id number]  DATE OF BIRTH:  Aug 14, 1940  SUBJECTIVE:  CHIEF COMPLAINT:   Chief Complaint  Patient presents with  . Shortness of Breath   - feels about the same, denies any chest pain - troponins trending down. On lasix drip  - syncopal episode with straining- vasovagal - remains on high flow nasal cannula- fio2 of 36% today  REVIEW OF SYSTEMS:  Review of Systems  Constitutional: Negative for chills, fever and malaise/fatigue.  HENT: Negative for ear discharge and ear pain.   Eyes: Negative for blurred vision and double vision.  Respiratory: Positive for cough, hemoptysis and shortness of breath. Negative for wheezing.   Cardiovascular: Negative for chest pain, palpitations and leg swelling.  Gastrointestinal: Negative for abdominal pain, constipation, diarrhea, nausea and vomiting.  Genitourinary: Negative for dysuria and urgency.  Musculoskeletal: Negative for myalgias.  Neurological: Negative for dizziness, sensory change, speech change, focal weakness, seizures and headaches.  Psychiatric/Behavioral: Negative for depression.    DRUG ALLERGIES:   Allergies  Allergen Reactions  . Centrum Other (See Comments)    Bloating, indigestion, chest pain  . Valsartan Other (See Comments)    Unsure of symptoms, was on list from previous hospital and patient was unsure of name of medicine.  . Vitamin D Analogs Other (See Comments)    Bloating and swelling in abdomen region, indigestion. Prescription vit d not otc    VITALS:  Blood pressure 115/78, pulse 78, temperature 99 F (37.2 C), temperature source Oral, resp. rate 15, height 5\' 9"  (1.753 m), weight 81.5 kg (179 lb 10.8 oz), SpO2 95 %.  PHYSICAL EXAMINATION:  Physical Exam  GENERAL:  75 y.o.-year-old patient lying in the bed with no acute distress. On high flow nasal cannula, not tachypneic EYES: Pupils equal, round, reactive to  light and accommodation. No scleral icterus. Extraocular muscles intact.  HEENT: Head atraumatic, normocephalic. Oropharynx and nasopharynx clear.  NECK:  Supple, no jugular venous distention. No thyroid enlargement, no tenderness.  LUNGS: Normal breath sounds bilaterally, improved wheezing, no rales,rhonchi or crepitation. No use of accessory muscles of respiration. Decreased bibasilar breath sounds noted CARDIOVASCULAR: S1, S2 normal. No murmurs, rubs, or gallops.  ABDOMEN: Soft, nontender, nondistended. Bowel sounds present. No organomegaly or mass. Peritoneal dialysis catheter present EXTREMITIES: No pedal edema, cyanosis, or clubbing.  NEUROLOGIC: Cranial nerves II through XII are intact. Muscle strength 5/5 in all extremities. Sensation intact. Gait not checked.  PSYCHIATRIC: The patient is alert and oriented x 3.  SKIN: No obvious rash, lesion, or ulcer.    LABORATORY PANEL:   CBC  Recent Labs Lab 12/15/15 0547  WBC 4.8  HGB 10.6*  HCT 29.4*  PLT 204   ------------------------------------------------------------------------------------------------------------------  Chemistries   Recent Labs Lab 12/10/15 2353  12/15/15 0547  NA 142  < > 141  K 5.3*  < > 3.6  CL 115*  < > 93*  CO2 16*  < > 33*  GLUCOSE 173*  < > 192*  BUN 71*  < > 98*  CREATININE 4.58*  < > 6.83*  CALCIUM 6.9*  < > 7.8*  MG  --   < > 1.6*  AST 25  --   --   ALT 16*  --   --   ALKPHOS 70  --   --   BILITOT 0.7  --   --   < > = values in this interval  not displayed. ------------------------------------------------------------------------------------------------------------------  Cardiac Enzymes  Recent Labs Lab 12/13/15 2253  TROPONINI 12.82*   ------------------------------------------------------------------------------------------------------------------  RADIOLOGY:  Dg Abd 1 View  Result Date: 12/14/2015 CLINICAL DATA:  75 year old presenting with excessive straining while  attempting to have a bowel movement. Acute onset of nausea and vomiting. Current history of diabetes and chronic kidney disease. EXAM: ABDOMEN - 1 VIEW COMPARISON:  01/26/2015, 01/24/2015 and earlier, including CT abdomen pelvis 01/24/2015. FINDINGS: Bowel gas pattern unremarkable without evidence of obstruction or significant ileus. Moderate stool burden in the colon. Peritoneal dialysis catheter loop in the midline of the pelvis. 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 Severe degenerative changes involving the left hip with complete loss of the joint space. Moderate degenerative changes involving the lumbar spine. IMPRESSION: 1. No acute abdominal abnormality.  Moderate colonic stool burden. 2. Aortoiliofemoral atherosclerosis. 3. Severe osteoarthritis involving the left hip. Electronically Signed   By: Evangeline Dakin M.D.   On: 12/14/2015 11:33   Ct Chest Wo Contrast  Result Date: 12/13/2015 CLINICAL DATA:  Difficulty breathing and requiring oxygen. EXAM: CT CHEST WITHOUT CONTRAST TECHNIQUE: Multidetector CT imaging of the chest was performed following the standard protocol without IV contrast. COMPARISON:  12/13/2015.  CT scan 12/13/2015 FINDINGS: Cardiovascular: Extensive coronary artery calcifications. Atherosclerotic calcifications in thoracic aorta with normal caliber. Atherosclerotic calcifications in the great vessels. Mediastinum/Nodes: Small lymph nodes throughout the mediastinum. Limited evaluation for hilar lymphadenopathy on this noncontrast examination. No axillary lymphadenopathy. Small amount of pericardial fluid. No gross abnormality to the esophagus. Lungs/Pleura: Trachea and mainstem bronchi are patent. Extensive ground-glass disease in the left lower lobe and posterior left upper lobe. Along with the ground-glass disease, there is confluent consolidation within the left upper and lower lobes. Similarly, there is ground-glass disease and  consolidation in the medial right lower lobe. Also, airspace disease in the posterior right upper lobe. Small amount of the disease in the anterior right upper lobe. Thickening along the right fissures. No pleural effusions. Upper Abdomen: Gallbladder has been removed. Perinephric stranding is probably chronic based on a CT from 01/24/2015. Splenic artery is heavily calcified. Concern for thickening of the left hemidiaphragm on sequence 2 image 129. Musculoskeletal: Subcutaneous edema in the chest. Old left posterior rib fractures. IMPRESSION: Bilateral airspace disease, left side greater than right. Findings are suggestive for bilateral pneumonia. Subcutaneous edema. Thickening along the posterior left hemidiaphragm is nonspecific but may be related to the adjacent lung disease and subcutaneous edema. Electronically Signed   By: Markus Daft M.D.   On: 12/13/2015 13:36   Nm Pulmonary Perf And Vent  Result Date: 12/13/2015 CLINICAL DATA:  Dyspnea. EXAM: NUCLEAR MEDICINE VENTILATION - PERFUSION LUNG SCAN TECHNIQUE: Ventilation images were obtained in multiple projections using inhaled aerosol Tc-93m DTPA. Perfusion images were obtained in multiple projections after intravenous injection of Tc-31m MAA. RADIOPHARMACEUTICALS:  28.75 mCi Technetium-16m DTPA aerosol inhalation and 3.91 mCi Technetium-23m MAA IV COMPARISON:  Chest CT 12/13/2015 and chest radiograph 12/13/2015 FINDINGS: Ventilation: Significant clumping of the radiopharmaceutical in the hilar regions, particularly in the left hilum. Decreased uptake in the left lung compared to the right. Perfusion: No wedge-shaped peripheral perfusion defects. There is slightly decreased uptake in the left lung compared to the right but this is a matched defect with the ventilation findings. In addition, there is extensive airspace disease in the left lung based on the chest radiograph. IMPRESSION: No evidence for pulmonary embolism. Electronically Signed   By: Markus Daft M.D.   On:  12/13/2015 13:21   Dg Chest Port 1 View  Result Date: 12/14/2015 CLINICAL DATA:  Dyspnea. Coronary artery disease and chronic kidney disease. EXAM: PORTABLE CHEST 1 VIEW COMPARISON:  12/13/2015 FINDINGS: Right jugular central line tip in the lower SVC region. Heart size is within normal limits and stable. Slightly improved aeration in the left lung with persistent hazy airspace densities in the mid left lung. Slightly improved aeration at the right lung base. Negative for a pneumothorax. Old left rib fractures. IMPRESSION: Slightly improved aeration in lungs with persistent airspace disease in the left lung. Findings remain compatible with pneumonia. Electronically Signed   By: Markus Daft M.D.   On: 12/14/2015 15:00   Dg Chest Port 1 View  Result Date: 12/13/2015 CLINICAL DATA:  Central line placement. Bilateral airspace opacities in the lungs on CT. EXAM: PORTABLE CHEST 1 VIEW COMPARISON:  12/13/2015 CT scan FINDINGS: Right internal jugular center venous catheter tip: SVC. No pneumothorax. Airspace opacities are observed primarily in the left lung, with only very vague hazy density in the right lung. The appearance is similar to that noted on today' s chest CT. Thoracic spondylosis. Old healed left posterolateral rib fractures. Atherosclerotic calcification of the aortic arch. IMPRESSION: 1. Right IJ line tip:  SVC.  No pneumothorax. 2. Stable appearance of airspace opacities in the lungs, left greater than right. 3. Atherosclerotic calcification of the aortic arch. 4. Thoracic spondylosis. Electronically Signed   By: Van Clines M.D.   On: 12/13/2015 17:07    EKG:   Orders placed or performed during the hospital encounter of 12/10/15  . ED EKG within 10 minutes  . ED EKG within 10 minutes  . EKG 12-Lead  . 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  #1 acute hypoxic respiratory failure-secondary to pneumonia and pulmonary edema -remains on high flow nasal cannula. Today at 36% fio2, 40L flow rate,change to nasal cannula and transfer to telemetry- clinically improving slowly, encourage incentive spirometry -Blood cultures are not done on admission. 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 Has peritoneal dialysis catheter. Plan was to start dialysis in a month after teaching is finished with PD catheter -Nephrology consulted.  - remains on lasix drip, voiding well. Also on metolazone - no acute indication to start dialysis now- but if needs this adm- will need a permacath  #4 DM- on SSI and low dose lantus, novolog tid started  #5 hypertension-on Norvasc and Coreg  #6 DVT Prophylaxis- SQ heparin  #7 Syncope- vasovagal on straining, monitor   Physical therapy consult, once off of high flow.   All the records are reviewed and case discussed with Care Management/Social Workerr. Management plans  discussed with the patient, family and they are in agreement.  CODE STATUS: Full code  TOTAL CRITICAL CARE TIME SPENT IN TAKING CARE OF THIS PATIENT: 36 minutes.   POSSIBLE D/C IN 2-3 DAYS,  DEPENDING ON CLINICAL CONDITION.   Gladstone Lighter M.D on 12/15/2015 at 8:14 AM  Between 7am to 6pm - Pager - [phone]  After 6pm go to [email or web address] - password [email or web address] [facility]  Office  [phone]  CC: Primary care physician; Leonel Ramsay, MD

## 2015-12-15 NOTE — Progress Notes (Signed)
Patient remains hemodynamically stable,tolerating 3 liters of oxygen per nasal canula,foley catheter insitu without s/s of infection,Peritoneal dialysis catheter intact and dressing clean and dry,denies pain,up to chair.

## 2015-12-15 NOTE — Progress Notes (Signed)
Inpatient Diabetes Program Recommendations  AACE/ADA: New Consensus Statement on Inpatient Glycemic Control (2015)  Target Ranges:  Prepandial:   less than 140 mg/dL      Peak postprandial:   less than 180 mg/dL (1-2 hours)      Critically ill patients:  140 - 180 mg/dL  Results for Todd [name], Todd [name] (MRN [id number]) as of 12/15/2015 07:55  Ref. Range 12/14/2015 07:21 12/14/2015 11:17 12/14/2015 16:36 12/14/2015 21:09 12/15/2015 07:29  Glucose-Capillary Latest Ref Range: 65 - 99 mg/dL 175 (H) 280 (H) 229 (H) 212 (H) 210 (H)    Review of Glycemic Control Diabetes history:DM2 Outpatient Diabetes medications: Lantus 5 -8 units QHS (5 units on Sunday, Tuesday, Thursday and 8 units Monday, Wednesday, Friday, and Saturday), Humalog 5 units with lunch if CBG > 150 mg/dl Current orders for Inpatient glycemic control: Lantus 8 units QHS, Novolog 0-9 units TID with meals, Novolog 0-5 units QHS, Novolog 3 units TID with meals  Inpatient Diabetes Program Recommendations: Insulin - Basal: Please consider increasing Lantus to 9 units QHS. Insulin - Meal Coverage: Please consider increasing meal coverage to 5 units TID with meals.  Thanks, Barnie Alderman, RN, MSN, CDE Diabetes Coordinator Inpatient Diabetes Program [phone] (Team Pager from [place] to [place]) [phone] ([facility] office) [phone] [facility] office) [phone] [facility] office)

## 2015-12-15 NOTE — Progress Notes (Signed)
Subjective:  Patient remains on high flow nasal cannula. Renal function has worsened. Good urine output at 2.9 L over the preceding 24 hours. We plan to discontinue Lasix drip today.  Objective:  Vital signs in last 24 hours:  Temp:  [98.1 F (36.7 C)-99 F (37.2 C)] 99 F (37.2 C) (09/27 0100) Pulse Rate:  [51-78] 78 (09/27 0700) Resp:  [12-24] 15 (09/27 0700) BP: (115-154)/(48-78) 115/78 (09/27 0700) SpO2:  [89 %-97 %] 95 % (09/27 0700) FiO2 (%):  [35 %-39 %] 38 % (09/27 0239) Weight:  [81.5 kg (179 lb 10.8 oz)] 81.5 kg (179 lb 10.8 oz) (09/27 0453)  Weight change:  Filed Weights   12/10/15 2349 12/12/15 0519 12/15/15 0453  Weight: 88.5 kg (195 lb) 90.6 kg (199 lb 11.8 oz) 81.5 kg (179 lb 10.8 oz)    Intake/Output:    Intake/Output Summary (Last 24 hours) at 12/15/15 0948 Last data filed at 12/15/15 0500  Gross per 24 hour  Intake           184.57 ml  Output             2550 ml  Net         -2365.43 ml     Physical Exam: General: No acute distress, sitting in the bed   HEENT Anicteric, moist oral mucous membranes, High flow nasal cannula   Neck Supple   Pulm/lungs Crackles at bases bilaterally   CVS/Heart S1S2 no rubs  Abdomen:  Soft, nontender, nondistended, PD catheter in place   Extremities: Trace lower extremity edema   Neurologic: Alert, oriented x 3 following commands  Skin: No acute rashes    Foley in place        Basic Metabolic Panel:   Recent Labs Lab 12/11/15 0544 12/12/15 0518 12/13/15 0402 12/14/15 0152 12/15/15 0547  NA 142 142 140 140 141  K 5.3* 4.8 3.7 3.9 3.6  CL 114* 111 104 97* 93*  CO2 18* 23 28 31  33*  GLUCOSE 231* 182* 163* 151* 192*  BUN 71* 72* 75* 87* 98*  CREATININE 4.74* 5.24* 5.54* 5.79* 6.83*  CALCIUM 6.9* 7.5* 7.6* 7.4* 7.8*  MG  --   --  1.5* 1.7 1.6*  PHOS  --   --  5.9*  --  5.9*     CBC:  Recent Labs Lab 12/10/15 2353 12/11/15 0544 12/13/15 0402 12/14/15 0152 12/15/15 0547  WBC 9.1 9.4 7.4 6.3  4.8  HGB 10.8* 10.5* 10.8* 10.4* 10.6*  HCT 31.7* 30.2* 30.6* 30.2* 29.4*  MCV 94.8 94.8 92.8 92.2 91.3  PLT 182 184 176 195 204      Microbiology:  Recent Results (from the past 720 hour(s))  MRSA PCR Screening     Status: None   Collection Time: 12/11/15 11:47 AM  Result Value Ref Range Status   MRSA by PCR NEGATIVE NEGATIVE Final    Comment:        The GeneXpert MRSA Assay (FDA approved for NASAL specimens only), is one component of a comprehensive MRSA colonization surveillance program. It is not intended to diagnose MRSA infection nor to guide or monitor treatment for MRSA infections.   Culture, expectorated sputum-assessment     Status: None   Collection Time: 12/13/15  4:00 PM  Result Value Ref Range Status   Specimen Description SPUTUM  Final   Special Requests NONE  Final   Sputum evaluation   Final    Sputum specimen not acceptable for testing.  Please recollect.  SPOKE TO [facility] 12/13/15 @ 1955  [place]    Report Status 12/13/2015 FINAL  Final    Coagulation Studies:  Recent Labs  12/13/15 1128  LABPROT 13.0  INR 0.98    Urinalysis: No results for input(s): COLORURINE, LABSPEC, PHURINE, GLUCOSEU, HGBUR, BILIRUBINUR, KETONESUR, PROTEINUR, UROBILINOGEN, NITRITE, LEUKOCYTESUR in the last 72 hours.  Invalid input(s): APPERANCEUR    Imaging: Dg Abd 1 View  Result Date: 12/14/2015 CLINICAL DATA:  75 year old presenting with excessive straining while attempting to have a bowel movement. Acute onset of nausea and vomiting. Current history of diabetes and chronic kidney disease. EXAM: ABDOMEN - 1 VIEW COMPARISON:  01/26/2015, 01/24/2015 and earlier, including CT abdomen pelvis 01/24/2015. FINDINGS: Bowel gas pattern unremarkable without evidence of obstruction or significant ileus. Moderate stool burden in the colon. Peritoneal dialysis catheter loop in the midline of the pelvis. 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 Severe degenerative changes involving the left hip with complete loss of the joint space. Moderate degenerative changes involving the lumbar spine. IMPRESSION: 1. No acute abdominal abnormality.  Moderate colonic stool burden. 2. Aortoiliofemoral atherosclerosis. 3. Severe osteoarthritis involving the left hip. Electronically Signed   By: Evangeline Dakin M.D.   On: 12/14/2015 11:33   Ct Chest Wo Contrast  Result Date: 12/13/2015 CLINICAL DATA:  Difficulty breathing and requiring oxygen. EXAM: CT CHEST WITHOUT CONTRAST TECHNIQUE: Multidetector CT imaging of the chest was performed following the standard protocol without IV contrast. COMPARISON:  12/13/2015.  CT scan 12/13/2015 FINDINGS: Cardiovascular: Extensive coronary artery calcifications. Atherosclerotic calcifications in thoracic aorta with normal caliber. Atherosclerotic calcifications in the great vessels. Mediastinum/Nodes: Small lymph nodes throughout the mediastinum. Limited evaluation for hilar lymphadenopathy on this noncontrast examination. No axillary lymphadenopathy. Small amount of pericardial fluid. No gross abnormality to the esophagus. Lungs/Pleura: Trachea and mainstem bronchi are patent. Extensive ground-glass disease in the left lower lobe and posterior left upper lobe. Along with the ground-glass disease, there is confluent consolidation within the left upper and lower lobes. Similarly, there is ground-glass disease and consolidation in the medial right lower lobe. Also, airspace disease in the posterior right upper lobe. Small amount of the disease in the anterior right upper lobe. Thickening along the right fissures. No pleural effusions. Upper Abdomen: Gallbladder has been removed. Perinephric stranding is probably chronic based on a CT from 01/24/2015. Splenic artery is heavily calcified. Concern for thickening of the left hemidiaphragm on sequence 2 image 129. Musculoskeletal: Subcutaneous edema in the chest. Old  left posterior rib fractures. IMPRESSION: Bilateral airspace disease, left side greater than right. Findings are suggestive for bilateral pneumonia. Subcutaneous edema. Thickening along the posterior left hemidiaphragm is nonspecific but may be related to the adjacent lung disease and subcutaneous edema. Electronically Signed   By: Markus Daft M.D.   On: 12/13/2015 13:36   Nm Pulmonary Perf And Vent  Result Date: 12/13/2015 CLINICAL DATA:  Dyspnea. EXAM: NUCLEAR MEDICINE VENTILATION - PERFUSION LUNG SCAN TECHNIQUE: Ventilation images were obtained in multiple projections using inhaled aerosol Tc-44m DTPA. Perfusion images were obtained in multiple projections after intravenous injection of Tc-15m MAA. RADIOPHARMACEUTICALS:  28.75 mCi Technetium-35m DTPA aerosol inhalation and 3.91 mCi Technetium-14m MAA IV COMPARISON:  Chest CT 12/13/2015 and chest radiograph 12/13/2015 FINDINGS: Ventilation: Significant clumping of the radiopharmaceutical in the hilar regions, particularly in the left hilum. Decreased uptake in the left lung compared to the right. Perfusion: No wedge-shaped peripheral perfusion defects. There is slightly decreased uptake in the left lung compared to the  right but this is a matched defect with the ventilation findings. In addition, there is extensive airspace disease in the left lung based on the chest radiograph. IMPRESSION: No evidence for pulmonary embolism. Electronically Signed   By: Markus Daft M.D.   On: 12/13/2015 13:21   Dg Chest Port 1 View  Result Date: 12/14/2015 CLINICAL DATA:  Dyspnea. Coronary artery disease and chronic kidney disease. EXAM: PORTABLE CHEST 1 VIEW COMPARISON:  12/13/2015 FINDINGS: Right jugular central line tip in the lower SVC region. Heart size is within normal limits and stable. Slightly improved aeration in the left lung with persistent hazy airspace densities in the mid left lung. Slightly improved aeration at the right lung base. Negative for a  pneumothorax. Old left rib fractures. IMPRESSION: Slightly improved aeration in lungs with persistent airspace disease in the left lung. Findings remain compatible with pneumonia. Electronically Signed   By: Markus Daft M.D.   On: 12/14/2015 15:00   Dg Chest Port 1 View  Result Date: 12/13/2015 CLINICAL DATA:  Central line placement. Bilateral airspace opacities in the lungs on CT. EXAM: PORTABLE CHEST 1 VIEW COMPARISON:  12/13/2015 CT scan FINDINGS: Right internal jugular center venous catheter tip: SVC. No pneumothorax. Airspace opacities are observed primarily in the left lung, with only very vague hazy density in the right lung. The appearance is similar to that noted on today' s chest CT. Thoracic spondylosis. Old healed left posterolateral rib fractures. Atherosclerotic calcification of the aortic arch. IMPRESSION: 1. Right IJ line tip:  SVC.  No pneumothorax. 2. Stable appearance of airspace opacities in the lungs, left greater than right. 3. Atherosclerotic calcification of the aortic arch. 4. Thoracic spondylosis. Electronically Signed   By: Van Clines M.D.   On: 12/13/2015 17:07     Medications:     . amLODipine  10 mg Oral Daily  . aspirin EC  81 mg Oral Daily  . carvedilol  6.25 mg Oral BID WC  . ceFEPime (MAXIPIME) IV  1 g Intravenous Q24H  . furosemide  60 mg Intravenous Daily  . heparin subcutaneous  5,000 Units Subcutaneous Q8H  . insulin aspart  0-5 Units Subcutaneous QHS  . insulin aspart  0-9 Units Subcutaneous TID WC  . insulin aspart  3 Units Subcutaneous TID WC  . insulin glargine  8 Units Subcutaneous QHS  . levofloxacin  500 mg Oral Q48H  . magnesium sulfate 1 - 4 g bolus IVPB  1 g Intravenous Once  . mouth rinse  15 mL Mouth Rinse BID  . metolazone  10 mg Oral Daily  . polyethylene glycol  17 g Oral BID  . rosuvastatin  20 mg Oral QHS  . senna-docusate  1 tablet Oral BID  . sodium bicarbonate  1,950 mg Oral TID   acetaminophen **OR** acetaminophen,  LORazepam, ondansetron **OR** ondansetron (ZOFRAN) IV, oxyCODONE, oxyCODONE-acetaminophen, promethazine, sodium chloride  Assessment/ Plan:  75 y.o.[place] male with medical problems of CKD st 5 followed by [place] [facility] nephrology, long standing DM, HTN,CAD , who was admitted to [facility] on 12/10/2015 for evaluation of shortness of breath.   1. CKD st 5 2. SOB - acute pulm edema and /or pneumonia (WBC count not elevated, no fever) 3. Peripheral edema 4. DM-2 with CKD 5. HTN w/ CKD 6. Anemia of CKD. 7. Secondary hyperparathyroidism. 8. Acute myocardial infarction. 9. Metabolic acidosis.    - Patient remains on high flow nasal cannula and he continues to have good urine output on Lasix drip.  Renal function worsening   Plan: The patient has diuresed quite significantly over the past 3 days. However the patient remains on high flow nasal cannula which suggests that there is some element of respiratory issue other than pulmonary edema. Therefore this time we will discontinue Lasix drip and start him on Lasix 60 mg IV daily. I have discussed the case with cardiology. Patient has underlying coronary artery disease. They will plan to perform cardiac catheterization on Monday. I discussed the potential for renal replacement therapy in the relative near future should he require contrast exposure. It appears the patient and his family are willing to undergo renal placement therapy if needed. It is the patient's preference to start on peritoneal dialysis should he require renal placement therapy in the short-term. We have advised that we could potentially start him on low volume exchanges using the cycler. This is also [facility] nephrology's preference. We will continue to monitor this complex case closely.     LOS: 4 Todd [name] 9/27/20179:48 AM

## 2015-12-15 NOTE — Care Management (Signed)
Patient transferred out of icu to 2A today.  He is currently on nasal cannula. Prior to this episode of illness, patient Independent in all adls, denies issues accessing medical care, obtaining medications or with transportation.  Current with her PCP.  Current 02 requirements are acute.  Will need to be assessed for need of home 02.  Has not been ambulatory since admission.  Discussed  possible discharge dispositions; home w home health, snf.  Patient is currently self employed as Engineer, maintenance (IT).  There is discussion of cardiac cath but condition must stabilize before proceeding

## 2015-12-15 NOTE — Consult Note (Signed)
[facility] Cardiology  SUBJECTIVE: I don't have chest pain   Vitals:   12/14/15 2106 12/15/15 0100 12/15/15 0453 12/15/15 0700  BP:    115/78  Pulse:    78  Resp:    15  Temp:  99 F (37.2 C)    TempSrc:  Oral    SpO2: 93%   95%  Weight:   81.5 kg (179 lb 10.8 oz)   Height:         Intake/Output Summary (Last 24 hours) at 12/15/15 0815 Last data filed at 12/15/15 0500  Gross per 24 hour  Intake           184.57 ml  Output             2550 ml  Net         -2365.43 ml      PHYSICAL EXAM  General: Well developed, well nourished, in no acute distress HEENT:  Normocephalic and atramatic Neck:  No JVD.  Lungs: Clear bilaterally to auscultation and percussion. Heart: HRRR . Normal S1 and S2 without gallops or murmurs.  Abdomen: Bowel sounds are positive, abdomen soft and non-tender  Msk:  Back normal, normal gait. Normal strength and tone for age. Extremities: No clubbing, cyanosis or edema.   Neuro: Alert and oriented X 3. Psych:  Good affect, responds appropriately   LABS: Basic Metabolic Panel:  Recent Labs  12/13/15 0402 12/14/15 0152 12/15/15 0547  NA 140 140 141  K 3.7 3.9 3.6  CL 104 97* 93*  CO2 28 31 33*  GLUCOSE 163* 151* 192*  BUN 75* 87* 98*  CREATININE 5.54* 5.79* 6.83*  CALCIUM 7.6* 7.4* 7.8*  MG 1.5* 1.7 1.6*  PHOS 5.9*  --  5.9*   Liver Function Tests: No results for input(s): AST, ALT, ALKPHOS, BILITOT, PROT, ALBUMIN in the last 72 hours. No results for input(s): LIPASE, AMYLASE in the last 72 hours. CBC:  Recent Labs  12/14/15 0152 12/15/15 0547  WBC 6.3 4.8  HGB 10.4* 10.6*  HCT 30.2* 29.4*  MCV 92.2 91.3  PLT 195 204   Cardiac Enzymes:  Recent Labs  12/13/15 1035 12/13/15 1730 12/13/15 2253  TROPONINI 16.95* 10.50* 12.82*   BNP: Invalid input(s): POCBNP D-Dimer: No results for input(s): DDIMER in the last 72 hours. Hemoglobin A1C: No results for input(s): HGBA1C in the last 72 hours. Fasting Lipid Panel: No results for  input(s): CHOL, HDL, LDLCALC, TRIG, CHOLHDL, LDLDIRECT in the last 72 hours. Thyroid Function Tests: No results for input(s): TSH, T4TOTAL, T3FREE, THYROIDAB in the last 72 hours.  Invalid input(s): FREET3 Anemia Panel: No results for input(s): VITAMINB12, FOLATE, FERRITIN, TIBC, IRON, RETICCTPCT in the last 72 hours.  Dg Abd 1 View  Result Date: 12/14/2015 CLINICAL DATA:  75 year old presenting with excessive straining while attempting to have a bowel movement. Acute onset of nausea and vomiting. Current history of diabetes and chronic kidney disease. EXAM: ABDOMEN - 1 VIEW COMPARISON:  01/26/2015, 01/24/2015 and earlier, including CT abdomen pelvis 01/24/2015. FINDINGS: Bowel gas pattern unremarkable without evidence of obstruction or significant ileus. Moderate stool burden in the colon. Peritoneal dialysis catheter loop in the midline of the pelvis. 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 Severe degenerative changes involving the left hip with complete loss of the joint space. Moderate degenerative changes involving the lumbar spine. IMPRESSION: 1. No acute abdominal abnormality.  Moderate colonic stool burden. 2. Aortoiliofemoral atherosclerosis. 3. Severe osteoarthritis involving the left hip. Electronically Signed  By: Evangeline Dakin M.D.   On: 12/14/2015 11:33   Ct Chest Wo Contrast  Result Date: 12/13/2015 CLINICAL DATA:  Difficulty breathing and requiring oxygen. EXAM: CT CHEST WITHOUT CONTRAST TECHNIQUE: Multidetector CT imaging of the chest was performed following the standard protocol without IV contrast. COMPARISON:  12/13/2015.  CT scan 12/13/2015 FINDINGS: Cardiovascular: Extensive coronary artery calcifications. Atherosclerotic calcifications in thoracic aorta with normal caliber. Atherosclerotic calcifications in the great vessels. Mediastinum/Nodes: Small lymph nodes throughout the mediastinum. Limited evaluation for hilar  lymphadenopathy on this noncontrast examination. No axillary lymphadenopathy. Small amount of pericardial fluid. No gross abnormality to the esophagus. Lungs/Pleura: Trachea and mainstem bronchi are patent. Extensive ground-glass disease in the left lower lobe and posterior left upper lobe. Along with the ground-glass disease, there is confluent consolidation within the left upper and lower lobes. Similarly, there is ground-glass disease and consolidation in the medial right lower lobe. Also, airspace disease in the posterior right upper lobe. Small amount of the disease in the anterior right upper lobe. Thickening along the right fissures. No pleural effusions. Upper Abdomen: Gallbladder has been removed. Perinephric stranding is probably chronic based on a CT from 01/24/2015. Splenic artery is heavily calcified. Concern for thickening of the left hemidiaphragm on sequence 2 image 129. Musculoskeletal: Subcutaneous edema in the chest. Old left posterior rib fractures. IMPRESSION: Bilateral airspace disease, left side greater than right. Findings are suggestive for bilateral pneumonia. Subcutaneous edema. Thickening along the posterior left hemidiaphragm is nonspecific but may be related to the adjacent lung disease and subcutaneous edema. Electronically Signed   By: Markus Daft M.D.   On: 12/13/2015 13:36   Nm Pulmonary Perf And Vent  Result Date: 12/13/2015 CLINICAL DATA:  Dyspnea. EXAM: NUCLEAR MEDICINE VENTILATION - PERFUSION LUNG SCAN TECHNIQUE: Ventilation images were obtained in multiple projections using inhaled aerosol Tc-28m DTPA. Perfusion images were obtained in multiple projections after intravenous injection of Tc-55m MAA. RADIOPHARMACEUTICALS:  28.75 mCi Technetium-42m DTPA aerosol inhalation and 3.91 mCi Technetium-74m MAA IV COMPARISON:  Chest CT 12/13/2015 and chest radiograph 12/13/2015 FINDINGS: Ventilation: Significant clumping of the radiopharmaceutical in the hilar regions, particularly in  the left hilum. Decreased uptake in the left lung compared to the right. Perfusion: No wedge-shaped peripheral perfusion defects. There is slightly decreased uptake in the left lung compared to the right but this is a matched defect with the ventilation findings. In addition, there is extensive airspace disease in the left lung based on the chest radiograph. IMPRESSION: No evidence for pulmonary embolism. Electronically Signed   By: Markus Daft M.D.   On: 12/13/2015 13:21   Dg Chest Port 1 View  Result Date: 12/14/2015 CLINICAL DATA:  Dyspnea. Coronary artery disease and chronic kidney disease. EXAM: PORTABLE CHEST 1 VIEW COMPARISON:  12/13/2015 FINDINGS: Right jugular central line tip in the lower SVC region. Heart size is within normal limits and stable. Slightly improved aeration in the left lung with persistent hazy airspace densities in the mid left lung. Slightly improved aeration at the right lung base. Negative for a pneumothorax. Old left rib fractures. IMPRESSION: Slightly improved aeration in lungs with persistent airspace disease in the left lung. Findings remain compatible with pneumonia. Electronically Signed   By: Markus Daft M.D.   On: 12/14/2015 15:00   Dg Chest Port 1 View  Result Date: 12/13/2015 CLINICAL DATA:  Central line placement. Bilateral airspace opacities in the lungs on CT. EXAM: PORTABLE CHEST 1 VIEW COMPARISON:  12/13/2015 CT scan FINDINGS: Right internal jugular center venous  catheter tip: SVC. No pneumothorax. Airspace opacities are observed primarily in the left lung, with only very vague hazy density in the right lung. The appearance is similar to that noted on today' s chest CT. Thoracic spondylosis. Old healed left posterolateral rib fractures. Atherosclerotic calcification of the aortic arch. IMPRESSION: 1. Right IJ line tip:  SVC.  No pneumothorax. 2. Stable appearance of airspace opacities in the lungs, left greater than right. 3. Atherosclerotic calcification of the  aortic arch. 4. Thoracic spondylosis. Electronically Signed   By: Van Clines M.D.   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 NSTEMI, in the absence of chest pain or ECG changes, in the setting of respiratory failure secondary to bilateral pneumonia and pulmonary edema 2. Mild ischemic cardiomyopathy 3. Respiratory failure, secondary to bilateral pneumonia and pulmonary edema, gradually improving, on high flow nasal cannula 4. End-stage renal disease, status post catheter, awaiting peritoneal dialysis  Recommendations  1. Continue current therapy 2. Defer full dose anticoagulation at this time and the absence of chest pain or evidence for ongoing ischemia 3. Continue diuresis 4. Defer cardiac catheterization until patient's story status better stabilized with lower O2 requirements. We will confer with Dr. Holley Raring about timing for cardiac catheterization and potential need for dialysis.  Isaias Cowman, MD, PhD, [facility] 12/15/2015 8:15 AM

## 2015-12-15 NOTE — Progress Notes (Signed)
Pharmacy Antibiotic Note  Todd [name] a75 y.o.maleadmittedon 9/22/2017with pneumonia. Pharmacy has been consulted for levofloxacin and cefepimedosing.  Plan: Pt received vanc 1g IV once. Due to renal function and low risk of pseudomonas, cefepime 2 g has been decreased to 1g IV q 24. Pt also receiving levofloxacin 750 mg IV x 1 followed by 500 mg PO q 48 hours due to renal function.    Height: 5\' 9"  (175.3 cm) Weight: 179 lb 10.8 oz (81.5 kg) IBW/kg (Calculated) : 70.7  Temp (24hrs), Avg:98.4 F (36.9 C), Min:98.1 F (36.7 C), Max:99 F (37.2 C)   Recent Labs Lab 12/10/15 2353 12/11/15 0110 12/11/15 0544 12/12/15 0518 12/13/15 0402 12/14/15 0152 12/15/15 0547  WBC 9.1  --  9.4  --  7.4 6.3 4.8  CREATININE 4.58*  --  4.74* 5.24* 5.54* 5.79* 6.83*  LATICACIDVEN  --  1.1  --   --   --   --   --     Estimated Creatinine Clearance: 9.5 mL/min (by C-G formula based on SCr of 6.83 mg/dL (H)).    Allergies  Allergen Reactions  . Centrum Other (See Comments)    Bloating, indigestion, chest pain  . Valsartan Other (See Comments)    Unsure of symptoms, was on list from previous hospital and patient was unsure of name of medicine.  . Vitamin D Analogs Other (See Comments)    Bloating and swelling in abdomen region, indigestion. Prescription vit d not otc    Antimicrobials this admission: vanc 9/23>>once cefepime 9/23>> levofloxacin 9/23 >>  Dose adjustments this admission: Cefepime 2 g was decreased to 1g IV q 24 due to renal function. Levofloxacin 750 q 24 was changed to 750 mg IV x 1 followed by 500 mg IV q 48 hours due to renal function. Levofloxacin 500 mg was changed from IV to PO  Microbiology results: 9/25 Sputum Cx: Sent 9/23MRSA PCR: negative  Thank you for allowing pharmacy to be a part of this patient's care.  Darrow Bussing, PharmD Pharmacy Resident 12/15/2015 1:42 PM

## 2015-12-15 NOTE — Progress Notes (Addendum)
[place] consulted for assistancein electrolyte and constipation management in this 77 yoM admitted with PNA.  1. Electrolytes: 9/27 am labs: K = 3.6. Mag = 1.6. Pt has poor renal function (CrCl = 9.5) and not currently receiving dialysis. Replaced mag conservatively with 1g. F/u with am labs  2. Constipation: Pt last documented BM on 9/21. Received three doses of senna/docusate bid. Added Miralax 9/26. Will continue to monitor.    Allergies  Allergen Reactions  . Centrum Other (See Comments)    Bloating, indigestion, chest pain  . Valsartan Other (See Comments)    Unsure of symptoms, was on list from previous hospital and patient was unsure of name of medicine.  . Vitamin D Analogs Other (See Comments)    Bloating and swelling in abdomen region, indigestion. Prescription vit d not otc    Patient Measurements: Height: 5\' 9"  (175.3 cm) Weight: 179 lb 10.8 oz (81.5 kg) IBW/kg (Calculated) : 70.7   Vital Signs: Temp: 98.2 F (36.8 C) (09/27 0800) Temp Source: Axillary (09/27 0800) BP: 133/60 (09/27 1100) Pulse Rate: 77 (09/27 1100) Intake/Output from previous day: 09/26 0701 - 09/27 0700 In: 226.6 [I.V.:176.6; IV Piggyback:50] Out: 2950 [Urine:2950] Intake/Output from this shift: Total I/O In: 258 [P.O.:240; I.V.:18] Out: 350 [Urine:350]  Labs:  Recent Labs  12/13/15 0402 12/13/15 1128 12/13/15 2253 12/14/15 0152 12/15/15 0547  WBC 7.4  --   --  6.3 4.8  HGB 10.8*  --   --  10.4* 10.6*  HCT 30.6*  --   --  30.2* 29.4*  PLT 176  --   --  195 204  APTT  --  33 69*  --   --   INR  --  0.98  --   --   --      Recent Labs  12/13/15 0402 12/14/15 0152 12/15/15 0547  NA 140 140 141  K 3.7 3.9 3.6  CL 104 97* 93*  CO2 28 31 33*  GLUCOSE 163* 151* 192*  BUN 75* 87* 98*  CREATININE 5.54* 5.79* 6.83*  CALCIUM 7.6* 7.4* 7.8*  MG 1.5* 1.7 1.6*  PHOS 5.9*  --  5.9*   Estimated Creatinine Clearance: 9.5 mL/min (by C-G formula based on  SCr of 6.83 mg/dL (H)).    Recent Labs  12/14/15 2109 12/15/15 0729 12/15/15 1137  GLUCAP 212* 210* 242*    Medications:  Scheduled:  . amLODipine  10 mg Oral Daily  . aspirin EC  81 mg Oral Daily  . carvedilol  6.25 mg Oral BID WC  . ceFEPime (MAXIPIME) IV  1 g Intravenous Q24H  . furosemide  60 mg Intravenous Daily  . heparin subcutaneous  5,000 Units Subcutaneous Q8H  . insulin aspart  0-5 Units Subcutaneous QHS  . insulin aspart  0-9 Units Subcutaneous TID WC  . insulin aspart  3 Units Subcutaneous TID WC  . insulin glargine  8 Units Subcutaneous QHS  . levofloxacin  500 mg Oral Q48H  . mouth rinse  15 mL Mouth Rinse BID  . metolazone  10 mg Oral Daily  . polyethylene glycol  17 g Oral BID  . rosuvastatin  20 mg Oral QHS  . senna-docusate  1 tablet Oral BID  . sodium bicarbonate  1,950 mg Oral TID    Darrow Bussing, PharmD Pharmacy Resident 12/15/2015 1:30 PM

## 2015-12-15 NOTE — Progress Notes (Signed)
[facility] Progess Note    ASSESSMENT/PLAN   DISCUSSION: 75 year old male with a history of hypertension, type 2 diabetes, coronary artery disease, chronic kidney disease, hyperlipidemia, and joint pain presenting with acute hypoxic respiratory failure, status post peritoneal dialysis catheter placement likely due to pneumonia complicated by NSTEMI and volume overload.   ASSESSMENT / PLAN:  PULMONARY A: Acute hypoxic respiratory failure. Improved today, Hi-flow has been weaned to 38% Severe Community-acquired pneumonia Volume overload with pleural effusion and pulmonary edema, improving with diuresis.   P:   VQ scan negative CT chest without contrast: images reviewed c/w pneumonia.  Wean down/off  hi-flow O2.  Continue broad spectrum abx.    CARDIOVASCULAR A:  Tachycardia Worsening edema-rule out CHF NSTEMI Cardiomyopathy; EF=40%, possibly ischemic.  P:  Will consider cath once pt is recovered from pneumonia. Aspirin daily. Furosemide infusion per nephrology   RENAL A:   Chronic kidney disease stage V Hyperkalemia  P:   Nephrology consulted and following Monitor and correct electrolytes. Lasix infusion per nephrology Monitor intake and output as well as urine output  GASTROINTESTINAL A:   No acute issues  P:   Diabetic/cardiac diet as tolerated   HEMATOLOGIC A:   Anemia of chronic disease  P:  Trend hemoglobin and hematocrit Transfuse if hemoglobin less than 7. Heparin for DVT prophylaxis   INFECTIOUS A:   Community-acquired pneumonia without sepsis  P:   Continue cefepime levaquin.  Daily chest x-ray   CULTURES: MRSA screen negative   ANTIBIOTICS: Cefepime 12/11/2015 >> Levaquin 9/25>>  ENDOCRINE A:   Type 2 diabetes mellitus   P:   Blood glucose monitoring with sliding scale insulin coverage   NEUROLOGIC A:   History of CVA  P:   RASS goal: Nonapplicable Monitor neurological status closely Optimize  blood pressure and diabetes and lipid control  MAJOR EVENTS/TEST RESULTS: 12/10/2015: Peritoneal dialysis catheter placed at [facility]; later that evening, patient presents in the ED with hypoxia and dyspnea; admitted with community-acquired pneumonia, MI: Developed worsening respiratory distress and was transferred to the ICU on 12/11/2015.  Best Practices  DVT Prophylaxis: heparin GI Prophylaxis:--   ---------------------------------------   ----------------------------------------   Name: Todd [name] MRN: [id number] DOB: 1940/05/21    ADMISSION DATE:  12/10/2015  SUBJECTIVE:   Pt currently on hi-flow oxygen; appears in no distress.   Review of Systems:  Constitutional: Feels well. Cardiovascular: No chest pain.  Pulmonary: Denies dyspnea.   The remainder of systems were reviewed and were found to be negative other than what is documented in the HPI.    VITAL SIGNS: Temp:  [98.1 F (36.7 C)-99 F (37.2 C)] 99 F (37.2 C) (09/27 0100) Pulse Rate:  [51-78] 78 (09/27 0700) Resp:  [12-24] 15 (09/27 0700) BP: (115-154)/(48-78) 115/78 (09/27 0700) SpO2:  [89 %-97 %] 95 % (09/27 0700) FiO2 (%):  [35 %-39 %] 38 % (09/27 0239) Weight:  [81.5 kg (179 lb 10.8 oz)] 81.5 kg (179 lb 10.8 oz) (09/27 0453) HEMODYNAMICS:   VENTILATOR SETTINGS: FiO2 (%):  [35 %-39 %] 38 % INTAKE / OUTPUT:  Intake/Output Summary (Last 24 hours) at 12/15/15 0827 Last data filed at 12/15/15 0500  Gross per 24 hour  Intake           184.57 ml  Output             2550 ml  Net         -2365.43 ml    PHYSICAL EXAMINATION: Physical Examination:  VS: BP 115/78   Pulse 78   Temp 99 F (37.2 C) (Oral)   Resp 15   Ht 5\' 9"  (1.753 m)   Wt 81.5 kg (179 lb 10.8 oz)   SpO2 95%   BMI 26.53 kg/m   General Appearance: No distress Appears in good spirits, conversational without dyspnea.  Neuro:without focal findings, mental status normal.  HEENT: PERRLA, EOM intact. Pulmonary: normal breath sounds    CardiovascularNormal S1,S2.  No m/r/g.   Abdomen: Benign, Soft, non-tender. Renal:  No costovertebral tenderness  GU:  Not performed at this time. Endocrine: No evident thyromegaly. Skin:   warm, no rashes, no ecchymosis  Extremities: normal, no cyanosis, clubbing.   LABS:   LABORATORY PANEL:   CBC  Recent Labs Lab 12/15/15 0547  WBC 4.8  HGB 10.6*  HCT 29.4*  PLT 204    Chemistries   Recent Labs Lab 12/10/15 2353  12/15/15 0547  NA 142  < > 141  K 5.3*  < > 3.6  CL 115*  < > 93*  CO2 16*  < > 33*  GLUCOSE 173*  < > 192*  BUN 71*  < > 98*  CREATININE 4.58*  < > 6.83*  CALCIUM 6.9*  < > 7.8*  MG  --   < > 1.6*  PHOS  --   < > 5.9*  AST 25  --   --   ALT 16*  --   --   ALKPHOS 70  --   --   BILITOT 0.7  --   --   < > = values in this interval not displayed.   Recent Labs Lab 12/13/15 2109 12/14/15 0721 12/14/15 1117 12/14/15 1636 12/14/15 2109 12/15/15 0729  GLUCAP 235* 175* 280* 229* 212* 210*    Recent Labs Lab 12/11/15 0024 12/14/15 1430  PHART 7.22* 7.48*  PCO2ART 43 44  PO2ART <31.0* 65*    Recent Labs Lab 12/10/15 2353  AST 25  ALT 16*  ALKPHOS 70  BILITOT 0.7  ALBUMIN 3.2*    Cardiac Enzymes  Recent Labs Lab 12/13/15 2253  TROPONINI 12.82*    RADIOLOGY:  Dg Abd 1 View  Result Date: 12/14/2015 CLINICAL DATA:  75 year old presenting with excessive straining while attempting to have a bowel movement. Acute onset of nausea and vomiting. Current history of diabetes and chronic kidney disease. EXAM: ABDOMEN - 1 VIEW COMPARISON:  01/26/2015, 01/24/2015 and earlier, including CT abdomen pelvis 01/24/2015. FINDINGS: Bowel gas pattern unremarkable without evidence of obstruction or significant ileus. Moderate stool burden in the colon. Peritoneal dialysis catheter loop in the midline of the pelvis. 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 Severe degenerative changes involving the left  hip with complete loss of the joint space. Moderate degenerative changes involving the lumbar spine. IMPRESSION: 1. No acute abdominal abnormality.  Moderate colonic stool burden. 2. Aortoiliofemoral atherosclerosis. 3. Severe osteoarthritis involving the left hip. Electronically Signed   By: Evangeline Dakin M.D.   On: 12/14/2015 11:33   Ct Chest Wo Contrast  Result Date: 12/13/2015 CLINICAL DATA:  Difficulty breathing and requiring oxygen. EXAM: CT CHEST WITHOUT CONTRAST TECHNIQUE: Multidetector CT imaging of the chest was performed following the standard protocol without IV contrast. COMPARISON:  12/13/2015.  CT scan 12/13/2015 FINDINGS: Cardiovascular: Extensive coronary artery calcifications. Atherosclerotic calcifications in thoracic aorta with normal caliber. Atherosclerotic calcifications in the great vessels. Mediastinum/Nodes: Small lymph nodes throughout the mediastinum. Limited evaluation for hilar lymphadenopathy on this noncontrast examination. No axillary  lymphadenopathy. Small amount of pericardial fluid. No gross abnormality to the esophagus. Lungs/Pleura: Trachea and mainstem bronchi are patent. Extensive ground-glass disease in the left lower lobe and posterior left upper lobe. Along with the ground-glass disease, there is confluent consolidation within the left upper and lower lobes. Similarly, there is ground-glass disease and consolidation in the medial right lower lobe. Also, airspace disease in the posterior right upper lobe. Small amount of the disease in the anterior right upper lobe. Thickening along the right fissures. No pleural effusions. Upper Abdomen: Gallbladder has been removed. Perinephric stranding is probably chronic based on a CT from 01/24/2015. Splenic artery is heavily calcified. Concern for thickening of the left hemidiaphragm on sequence 2 image 129. Musculoskeletal: Subcutaneous edema in the chest. Old left posterior rib fractures. IMPRESSION: Bilateral airspace  disease, left side greater than right. Findings are suggestive for bilateral pneumonia. Subcutaneous edema. Thickening along the posterior left hemidiaphragm is nonspecific but may be related to the adjacent lung disease and subcutaneous edema. Electronically Signed   By: Markus Daft M.D.   On: 12/13/2015 13:36   Nm Pulmonary Perf And Vent  Result Date: 12/13/2015 CLINICAL DATA:  Dyspnea. EXAM: NUCLEAR MEDICINE VENTILATION - PERFUSION LUNG SCAN TECHNIQUE: Ventilation images were obtained in multiple projections using inhaled aerosol Tc-49m DTPA. Perfusion images were obtained in multiple projections after intravenous injection of Tc-35m MAA. RADIOPHARMACEUTICALS:  28.75 mCi Technetium-19m DTPA aerosol inhalation and 3.91 mCi Technetium-55m MAA IV COMPARISON:  Chest CT 12/13/2015 and chest radiograph 12/13/2015 FINDINGS: Ventilation: Significant clumping of the radiopharmaceutical in the hilar regions, particularly in the left hilum. Decreased uptake in the left lung compared to the right. Perfusion: No wedge-shaped peripheral perfusion defects. There is slightly decreased uptake in the left lung compared to the right but this is a matched defect with the ventilation findings. In addition, there is extensive airspace disease in the left lung based on the chest radiograph. IMPRESSION: No evidence for pulmonary embolism. Electronically Signed   By: Markus Daft M.D.   On: 12/13/2015 13:21   Dg Chest Port 1 View  Result Date: 12/14/2015 CLINICAL DATA:  Dyspnea. Coronary artery disease and chronic kidney disease. EXAM: PORTABLE CHEST 1 VIEW COMPARISON:  12/13/2015 FINDINGS: Right jugular central line tip in the lower SVC region. Heart size is within normal limits and stable. Slightly improved aeration in the left lung with persistent hazy airspace densities in the mid left lung. Slightly improved aeration at the right lung base. Negative for a pneumothorax. Old left rib fractures. IMPRESSION: Slightly improved  aeration in lungs with persistent airspace disease in the left lung. Findings remain compatible with pneumonia. Electronically Signed   By: Markus Daft M.D.   On: 12/14/2015 15:00   Dg Chest Port 1 View  Result Date: 12/13/2015 CLINICAL DATA:  Central line placement. Bilateral airspace opacities in the lungs on CT. EXAM: PORTABLE CHEST 1 VIEW COMPARISON:  12/13/2015 CT scan FINDINGS: Right internal jugular center venous catheter tip: SVC. No pneumothorax. Airspace opacities are observed primarily in the left lung, with only very vague hazy density in the right lung. The appearance is similar to that noted on today' s chest CT. Thoracic spondylosis. Old healed left posterolateral rib fractures. Atherosclerotic calcification of the aortic arch. IMPRESSION: 1. Right IJ line tip:  SVC.  No pneumothorax. 2. Stable appearance of airspace opacities in the lungs, left greater than right. 3. Atherosclerotic calcification of the aortic arch. 4. Thoracic spondylosis. Electronically Signed   By: Cindra Eves.D.  On: 12/13/2015 17:07       --Marda Stalker, MD.  ICU Pager: [phone]  Pulmonary and Critical Care Office Number: [phone]  Patricia Pesa, M.D.  Vilinda Boehringer, M.D.  Merton Border, M.D  12/15/2015   Critical Care Attestation.  I have personally obtained a history, examined the patient, evaluated laboratory and imaging results, formulated the assessment and plan and placed orders. The Patient requires high complexity decision making for assessment and support, frequent evaluation and titration of therapies, application of advanced monitoring technologies and extensive interpretation of multiple databases. The patient has critical illness that could lead imminently to failure of 1 or more organ systems and requires the highest level of physician preparedness to intervene.  Critical Care Time devoted to patient care services described in this note is 40 minutes and is exclusive  of time spent in procedures.

## 2015-12-16 ENCOUNTER — Inpatient Hospital Stay: Payer: Medicare Other

## 2015-12-16 LAB — BASIC METABOLIC PANEL
ANION GAP: 17 — AB (ref 5–15)
BUN: 105 mg/dL — ABNORMAL HIGH (ref 6–20)
CALCIUM: 7.8 mg/dL — AB (ref 8.9–10.3)
CO2: 34 mmol/L — ABNORMAL HIGH (ref 22–32)
Chloride: 88 mmol/L — ABNORMAL LOW (ref 101–111)
Creatinine, Ser: 7.62 mg/dL — ABNORMAL HIGH (ref 0.61–1.24)
GFR, EST AFRICAN AMERICAN: 7 mL/min — AB (ref >60)
GFR, EST NON AFRICAN AMERICAN: 6 mL/min — AB (ref >60)
GLUCOSE: 175 mg/dL — AB (ref 65–99)
POTASSIUM: 3.6 mmol/L (ref 3.5–5.1)
Sodium: 139 mmol/L (ref 135–145)

## 2015-12-16 LAB — CBC
HEMATOCRIT: 32.1 % — AB (ref 40.0–52.0)
HEMOGLOBIN: 11 g/dL — AB (ref 13.0–18.0)
MCH: 31.8 pg (ref 26.0–34.0)
MCHC: 34.2 g/dL (ref 32.0–36.0)
MCV: 92.9 fL (ref 80.0–100.0)
Platelets: 228 10*3/uL (ref 150–440)
RBC: 3.45 MIL/uL — AB (ref 4.40–5.90)
RDW: 12.6 % (ref 11.5–14.5)
WBC: 5.5 10*3/uL (ref 3.8–10.6)

## 2015-12-16 LAB — MAGNESIUM: MAGNESIUM: 2 mg/dL (ref 1.7–2.4)

## 2015-12-16 LAB — GLUCOSE, CAPILLARY
GLUCOSE-CAPILLARY: 205 mg/dL — AB (ref 65–99)
GLUCOSE-CAPILLARY: 192 mg/dL — AB (ref 65–99)
Glucose-Capillary: 176 mg/dL — ABNORMAL HIGH (ref 65–99)
Glucose-Capillary: 207 mg/dL — ABNORMAL HIGH (ref 65–99)

## 2015-12-16 MED ORDER — BISACODYL 5 MG PO TBEC
10.0000 mg | DELAYED_RELEASE_TABLET | Freq: Every day | ORAL | Status: DC | PRN
  Administered 2015-12-16: 10 mg via ORAL
  Filled 2015-12-16: qty 2

## 2015-12-16 MED ORDER — POLYETHYLENE GLYCOL 3350 17 G PO PACK
17.0000 g | PACK | Freq: Every day | ORAL | Status: DC
  Administered 2015-12-16 – 2015-12-24 (×7): 17 g via ORAL
  Filled 2015-12-16 (×8): qty 1

## 2015-12-16 NOTE — Care Management (Signed)
Patient is now off all lasix.  BUN and Creat on the rise. needs cardiac cath.  may need to initiate dialysis so can have cardiac cath.  Remains on nasal cannula 02.  Patient has been out of the bed to the chair.  Requested physical therapy order when condition stablizes

## 2015-12-16 NOTE — Progress Notes (Signed)
[facility] [place] NAME: Todd [name]    MR#:  [id number]  DATE OF BIRTH:  02-Jul-1940  SUBJECTIVE:  CHIEF COMPLAINT:   Chief Complaint  Patient presents with  . Shortness of Breath   - feels sick today, very nauseous  - cr worsening, urine output is still good - weaned off high flow nasal cannula, on 2L now  REVIEW OF SYSTEMS:  Review of Systems  Constitutional: Negative for chills, fever and malaise/fatigue.  HENT: Negative for ear discharge and ear pain.   Eyes: Negative for blurred vision and double vision.  Respiratory: Positive for cough, hemoptysis and shortness of breath. Negative for wheezing.   Cardiovascular: Negative for chest pain, palpitations and leg swelling.  Gastrointestinal: Positive for nausea and vomiting. Negative for abdominal pain, constipation and diarrhea.  Genitourinary: Negative for dysuria and urgency.  Musculoskeletal: Negative for myalgias.  Neurological: Negative for dizziness, sensory change, speech change, focal weakness, seizures and headaches.  Psychiatric/Behavioral: Negative for depression.    DRUG ALLERGIES:   Allergies  Allergen Reactions  . Centrum Other (See Comments)    Bloating, indigestion, chest pain  . Valsartan Other (See Comments)    Unsure of symptoms, was on list from previous hospital and patient was unsure of name of medicine.  . Vitamin D Analogs Other (See Comments)    Bloating and swelling in abdomen region, indigestion. Prescription vit d not otc    VITALS:  Blood pressure 134/69, pulse 79, temperature 98 F (36.7 C), temperature source Oral, resp. rate 18, height 5\' 9"  (1.753 m), weight 79.3 kg (174 lb 14.4 oz), SpO2 95 %.  PHYSICAL EXAMINATION:  Physical Exam  GENERAL:  75 y.o.-year-old patient lying in the bed with no acute distress.  not tachypneic EYES: Pupils equal, round, reactive to light and accommodation. No scleral icterus. Extraocular muscles intact.    HEENT: Head atraumatic, normocephalic. Oropharynx and nasopharynx clear.  NECK:  Supple, no jugular venous distention. No thyroid enlargement, no tenderness.  LUNGS: Normal breath sounds bilaterally, improved wheezing, fine rales at the bases, no rhonchi or crepitation. No use of accessory muscles of respiration. Decreased bibasilar breath sounds noted CARDIOVASCULAR: S1, S2 normal. No murmurs, rubs, or gallops.  ABDOMEN: Soft, nontender, nondistended. Bowel sounds present. No organomegaly or mass. Peritoneal dialysis catheter present EXTREMITIES: No pedal edema, cyanosis, or clubbing.  NEUROLOGIC: Cranial nerves II through XII are intact. Muscle strength 5/5 in all extremities. Sensation intact. Gait not checked.  PSYCHIATRIC: The patient is alert and oriented x 3.  SKIN: No obvious rash, lesion, or ulcer.    LABORATORY PANEL:   CBC  Recent Labs Lab 12/16/15 0603  WBC 5.5  HGB 11.0*  HCT 32.1*  PLT 228   ------------------------------------------------------------------------------------------------------------------  Chemistries   Recent Labs Lab 12/10/15 2353  12/16/15 0603  NA 142  < > 139  K 5.3*  < > 3.6  CL 115*  < > 88*  CO2 16*  < > 34*  GLUCOSE 173*  < > 175*  BUN 71*  < > 105*  CREATININE 4.58*  < > 7.62*  CALCIUM 6.9*  < > 7.8*  MG  --   < > 2.0  AST 25  --   --   ALT 16*  --   --   ALKPHOS 70  --   --   BILITOT 0.7  --   --   < > = values in this interval not displayed. ------------------------------------------------------------------------------------------------------------------  Cardiac Enzymes  Recent Labs Lab 12/13/15 2253  TROPONINI 12.82*   ------------------------------------------------------------------------------------------------------------------  RADIOLOGY:  Dg Chest Port 1 View  Result Date: 12/14/2015 CLINICAL DATA:  Dyspnea. Coronary artery disease and chronic kidney disease. EXAM: PORTABLE CHEST 1 VIEW COMPARISON:   12/13/2015 FINDINGS: Right jugular central line tip in the lower SVC region. Heart size is within normal limits and stable. Slightly improved aeration in the left lung with persistent hazy airspace densities in the mid left lung. Slightly improved aeration at the right lung base. Negative for a pneumothorax. Old left rib fractures. IMPRESSION: Slightly improved aeration in lungs with persistent airspace disease in the left lung. Findings remain compatible with pneumonia. Electronically Signed   By: Markus Daft M.D.   On: 12/14/2015 15:00    EKG:   Orders placed or performed during the hospital encounter of 12/10/15  . ED EKG within 10 minutes  . ED EKG within 10 minutes  . EKG 12-Lead  . 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  #1 acute hypoxic respiratory failure-secondary to pneumonia and pulmonary edema -Off of high flow nasal cannula. Currently on 2 L nasal cannula. - encourage incentive spirometry -Blood cultures are not done on admission. 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 Has  peritoneal dialysis catheter. Plan was to start dialysis in a month after teaching is finished with PD catheter -Nephrology consulted. Now that creatinine is worsening, might need dialysis sooner than later especially if he is developing uremic symptoms. -Lasix drip is discontinued. Discontinue metolazone -Discuss with nephrology if his PD catheter can be used or if he needs a temporary Dialysis catheter  #4 DM- on SSI and low dose lantus, novolog tid started  #5 hypertension-on Norvasc and Coreg  #6 DVT Prophylaxis- SQ heparin    Physical therapy consult, once off of high flow.   All the records are reviewed and case discussed with Care Management/Social Workerr. Management plans discussed with the patient, family and they are in agreement.  CODE STATUS: Full code  TOTAL TIME SPENT IN TAKING CARE OF THIS PATIENT: 36 minutes.   POSSIBLE D/C IN 2-3 DAYS, DEPENDING ON CLINICAL CONDITION.   Annye Forrey M.D on 12/16/2015 at 1:25 PM  Between 7am to 6pm - Pager - [phone]  After 6pm go to [email or web address] - password [email or web address]  Office  [phone]  CC: Primary care physician; Leonel Ramsay, MD

## 2015-12-16 NOTE — Progress Notes (Signed)
Inpatient Diabetes Program Recommendations  AACE/ADA: New Consensus Statement on Inpatient Glycemic Control (2015)  Target Ranges:  Prepandial:   less than 140 mg/dL      Peak postprandial:   less than 180 mg/dL (1-2 hours)      Critically ill patients:  140 - 180 mg/dL   Results for ALDO, PASSWATER (MRN [id number]) as of 12/16/2015 09:07  Ref. Range 12/15/2015 07:29 12/15/2015 11:37 12/15/2015 17:22 12/15/2015 21:08 12/16/2015 07:28  Glucose-Capillary Latest Ref Range: 65 - 99 mg/dL 210 (H) 242 (H) 188 (H) 181 (H) 205 (H)   Review of Glycemic Control  Diabetes history:DM2 Outpatient Diabetes medications: Lantus 5 -8 units QHS (5 units on Sunday, Tuesday, Thursday and 8 units Monday, Wednesday, Friday, and Saturday), Humalog 5 units with lunch if CBG > 150 mg/dl Current orders for Inpatient glycemic control: Lantus 8 units QHS, Novolog 0-9 units TID with meals, Novolog 0-5 units QHS, Novolog 3 units TID with meals  Inpatient Diabetes Program Recommendations: Insulin - Basal: Please consider increasing Lantus to 9 units QHS. Insulin - Meal Coverage: Please consider increasing meal coverage to 5 units TID with meals.  Thanks, Barnie Alderman, RN, MSN, CDE Diabetes Coordinator Inpatient Diabetes Program [phone] (Team Pager from [place] to [place]) [phone] ([facility] office) [phone] [facility] office) [phone] [facility] office)

## 2015-12-16 NOTE — Progress Notes (Signed)
Patient has voided post foley catheter removal

## 2015-12-16 NOTE — Progress Notes (Signed)
[facility] Cardiology  SUBJECTIVE: I don't have chest pain   Vitals:   12/15/15 1402 12/15/15 2004 12/16/15 0321 12/16/15 0759  BP: (!) 147/66 139/61 125/63 (!) 150/73  Pulse: 76 69 73 80  Resp: 18 18 18 16   Temp: 98 F (36.7 C) 98.2 F (36.8 C) 98.4 F (36.9 C) 98.4 F (36.9 C)  TempSrc: Oral Oral Oral Oral  SpO2: 92% 93% 92% 97%  Weight:   79.3 kg (174 lb 14.4 oz)   Height:         Intake/Output Summary (Last 24 hours) at 12/16/15 [id number] Last data filed at 12/16/15 0324  Gross per 24 hour  Intake              250 ml  Output             2000 ml  Net            -1750 ml      PHYSICAL EXAM  General: Well developed, well nourished, in no acute distress HEENT:  Normocephalic and atramatic Neck:  No JVD.  Lungs: Clear bilaterally to auscultation and percussion. Heart: HRRR . Normal S1 and S2 without gallops or murmurs.  Abdomen: Bowel sounds are positive, abdomen soft and non-tender  Msk:  Back normal, normal gait. Normal strength and tone for age. Extremities: No clubbing, cyanosis or edema.   Neuro: Alert and oriented X 3. Psych:  Good affect, responds appropriately   LABS: Basic Metabolic Panel:  Recent Labs  12/14/15 0152 12/15/15 0547  NA 140 141  K 3.9 3.6  CL 97* 93*  CO2 31 33*  GLUCOSE 151* 192*  BUN 87* 98*  CREATININE 5.79* 6.83*  CALCIUM 7.4* 7.8*  MG 1.7 1.6*  PHOS  --  5.9*   Liver Function Tests: No results for input(s): AST, ALT, ALKPHOS, BILITOT, PROT, ALBUMIN in the last 72 hours. No results for input(s): LIPASE, AMYLASE in the last 72 hours. CBC:  Recent Labs  12/15/15 0547 12/16/15 0603  WBC 4.8 5.5  HGB 10.6* 11.0*  HCT 29.4* 32.1*  MCV 91.3 92.9  PLT 204 228   Cardiac Enzymes:  Recent Labs  12/13/15 1035 12/13/15 1730 12/13/15 2253  TROPONINI 16.95* 10.50* 12.82*   BNP: Invalid input(s): POCBNP D-Dimer: No results for input(s): DDIMER in the last 72 hours. Hemoglobin A1C: No results for input(s): HGBA1C in the last  72 hours. Fasting Lipid Panel: No results for input(s): CHOL, HDL, LDLCALC, TRIG, CHOLHDL, LDLDIRECT in the last 72 hours. Thyroid Function Tests: No results for input(s): TSH, T4TOTAL, T3FREE, THYROIDAB in the last 72 hours.  Invalid input(s): FREET3 Anemia Panel: No results for input(s): VITAMINB12, FOLATE, FERRITIN, TIBC, IRON, RETICCTPCT in the last 72 hours.  Dg Abd 1 View  Result Date: 12/14/2015 CLINICAL DATA:  75 year old presenting with excessive straining while attempting to have a bowel movement. Acute onset of nausea and vomiting. Current history of diabetes and chronic kidney disease. EXAM: ABDOMEN - 1 VIEW COMPARISON:  01/26/2015, 01/24/2015 and earlier, including CT abdomen pelvis 01/24/2015. FINDINGS: Bowel gas pattern unremarkable without evidence of obstruction or significant ileus. Moderate stool burden in the colon. Peritoneal dialysis catheter loop in the midline of the pelvis. 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 Severe degenerative changes involving the left hip with complete loss of the joint space. Moderate degenerative changes involving the lumbar spine. IMPRESSION: 1. No acute abdominal abnormality.  Moderate colonic stool burden. 2. Aortoiliofemoral atherosclerosis. 3. Severe osteoarthritis involving the left  hip. Electronically Signed   By: Evangeline Dakin M.D.   On: 12/14/2015 11:33   Dg Chest Port 1 View  Result Date: 12/14/2015 CLINICAL DATA:  Dyspnea. Coronary artery disease and chronic kidney disease. EXAM: PORTABLE CHEST 1 VIEW COMPARISON:  12/13/2015 FINDINGS: Right jugular central line tip in the lower SVC region. Heart size is within normal limits and stable. Slightly improved aeration in the left lung with persistent hazy airspace densities in the mid left lung. Slightly improved aeration at the right lung base. Negative for a pneumothorax. Old left rib fractures. IMPRESSION: Slightly improved aeration in lungs  with persistent airspace disease in the left lung. Findings remain compatible with pneumonia. Electronically Signed   By: Markus Daft M.D.   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 NSTEMI, without chest pain or ECG changes, in the setting of respiratory failure secondary to bilateral pneumonia and pulmonary edema 2. Mild ischemic cardiomyopathy 3. Respiratory failure, secondary to bilateral pneumonia and pulmonary edema, improved, on O2 per nasal cannula 4. End-stage renal disease, status post recent catheter placement for pending peritoneal dialysis  Recommendations  1. Agree with current therapy 2. Defer full dose anticoagulation at this time 3. Continue diuresis 4. Tentatively plan for cardiac catheterization 12/20/2015 versus as outpatient in one to 2 weeks pending patient's clinical course over the next one to 2 days. Patient had very high risk for contrast-induced nephrotoxicity and may require dialysis. Will discuss further with Dr. Holley Raring.   Isaias Cowman, MD, PhD, [facility] 12/16/2015 8:14 AM

## 2015-12-16 NOTE — Progress Notes (Signed)
Patient had a massive bowel movement and constipation resolved

## 2015-12-16 NOTE — Progress Notes (Signed)
Subjective:  Pt transitioned to floor care. Doing better today. Off high flow [place]. BUN up to 105, Cr up to 7.6.   Will d/c lasix at this time.   Objective:  Vital signs in last 24 hours:  Temp:  [98 F (36.7 C)-98.4 F (36.9 C)] 98.4 F (36.9 C) (09/28 0759) Pulse Rate:  [69-80] 80 (09/28 0759) Resp:  [14-18] 16 (09/28 0759) BP: (125-150)/(61-75) 150/73 (09/28 0759) SpO2:  [92 %-97 %] 97 % (09/28 0759) Weight:  [79.3 kg (174 lb 14.4 oz)] 79.3 kg (174 lb 14.4 oz) (09/28 0321)  Weight change: -2.166 kg (-4 lb 12.4 oz) Filed Weights   12/12/15 0519 12/15/15 0453 12/16/15 0321  Weight: 90.6 kg (199 lb 11.8 oz) 81.5 kg (179 lb 10.8 oz) 79.3 kg (174 lb 14.4 oz)    Intake/Output:    Intake/Output Summary (Last 24 hours) at 12/16/15 1103 Last data filed at 12/16/15 0900  Gross per 24 hour  Intake              250 ml  Output             2050 ml  Net            -1800 ml     Physical Exam: General: No acute distress, sitting in the bed   HEENT Anicteric, moist oral mucous membranes, regular nasal cannula   Neck Supple   Pulm/lungs CTAB normal effort   CVS/Heart S1S2 no rubs  Abdomen:  Soft, nontender, nondistended, PD catheter in place   Extremities: Trace lower extremity edema   Neurologic: Alert, oriented x 3 following commands  Skin: No acute rashes    Foley in place        Basic Metabolic Panel:   Recent Labs Lab 12/12/15 0518 12/13/15 0402 12/14/15 0152 12/15/15 0547 12/16/15 0603  NA 142 140 140 141 139  K 4.8 3.7 3.9 3.6 3.6  CL 111 104 97* 93* 88*  CO2 23 28 31  33* 34*  GLUCOSE 182* 163* 151* 192* 175*  BUN 72* 75* 87* 98* 105*  CREATININE 5.24* 5.54* 5.79* 6.83* 7.62*  CALCIUM 7.5* 7.6* 7.4* 7.8* 7.8*  MG  --  1.5* 1.7 1.6* 2.0  PHOS  --  5.9*  --  5.9*  --      CBC:  Recent Labs Lab 12/11/15 0544 12/13/15 0402 12/14/15 0152 12/15/15 0547 12/16/15 0603  WBC 9.4 7.4 6.3 4.8 5.5  HGB 10.5* 10.8* 10.4* 10.6* 11.0*  HCT 30.2* 30.6* 30.2*  29.4* 32.1*  MCV 94.8 92.8 92.2 91.3 92.9  PLT 184 176 195 204 228      Microbiology:  Recent Results (from the past 720 hour(s))  MRSA PCR Screening     Status: None   Collection Time: 12/11/15 11:47 AM  Result Value Ref Range Status   MRSA by PCR NEGATIVE NEGATIVE Final    Comment:        The GeneXpert MRSA Assay (FDA approved for NASAL specimens only), is one component of a comprehensive MRSA colonization surveillance program. It is not intended to diagnose MRSA infection nor to guide or monitor treatment for MRSA infections.   Culture, expectorated sputum-assessment     Status: None   Collection Time: 12/13/15  4:00 PM  Result Value Ref Range Status   Specimen Description SPUTUM  Final   Special Requests NONE  Final   Sputum evaluation   Final    Sputum specimen not acceptable for testing.  Please recollect.  SPOKE TO [facility] WILLIAMS 12/13/15 @ 1955  [place]    Report Status 12/13/2015 FINAL  Final    Coagulation Studies:  Recent Labs  12/13/15 1128  LABPROT 13.0  INR 0.98    Urinalysis: No results for input(s): COLORURINE, LABSPEC, PHURINE, GLUCOSEU, HGBUR, BILIRUBINUR, KETONESUR, PROTEINUR, UROBILINOGEN, NITRITE, LEUKOCYTESUR in the last 72 hours.  Invalid input(s): APPERANCEUR    Imaging: Dg Abd 1 View  Result Date: 12/14/2015 CLINICAL DATA:  75 year old presenting with excessive straining while attempting to have a bowel movement. Acute onset of nausea and vomiting. Current history of diabetes and chronic kidney disease. EXAM: ABDOMEN - 1 VIEW COMPARISON:  01/26/2015, 01/24/2015 and earlier, including CT abdomen pelvis 01/24/2015. FINDINGS: Bowel gas pattern unremarkable without evidence of obstruction or significant ileus. Moderate stool burden in the colon. Peritoneal dialysis catheter loop in the midline of the pelvis. 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 Severe degenerative changes involving the left  hip with complete loss of the joint space. Moderate degenerative changes involving the lumbar spine. IMPRESSION: 1. No acute abdominal abnormality.  Moderate colonic stool burden. 2. Aortoiliofemoral atherosclerosis. 3. Severe osteoarthritis involving the left hip. Electronically Signed   By: Evangeline Dakin M.D.   On: 12/14/2015 11:33   Dg Chest Port 1 View  Result Date: 12/14/2015 CLINICAL DATA:  Dyspnea. Coronary artery disease and chronic kidney disease. EXAM: PORTABLE CHEST 1 VIEW COMPARISON:  12/13/2015 FINDINGS: Right jugular central line tip in the lower SVC region. Heart size is within normal limits and stable. Slightly improved aeration in the left lung with persistent hazy airspace densities in the mid left lung. Slightly improved aeration at the right lung base. Negative for a pneumothorax. Old left rib fractures. IMPRESSION: Slightly improved aeration in lungs with persistent airspace disease in the left lung. Findings remain compatible with pneumonia. Electronically Signed   By: Markus Daft M.D.   On: 12/14/2015 15:00     Medications:     . amLODipine  10 mg Oral Daily  . aspirin EC  81 mg Oral Daily  . carvedilol  6.25 mg Oral BID WC  . ceFEPime (MAXIPIME) IV  1 g Intravenous Q24H  . furosemide  60 mg Intravenous Daily  . heparin subcutaneous  5,000 Units Subcutaneous Q8H  . insulin aspart  0-5 Units Subcutaneous QHS  . insulin aspart  0-9 Units Subcutaneous TID WC  . insulin aspart  3 Units Subcutaneous TID WC  . insulin glargine  8 Units Subcutaneous QHS  . levofloxacin  500 mg Oral Q48H  . mouth rinse  15 mL Mouth Rinse BID  . metolazone  10 mg Oral Daily  . rosuvastatin  20 mg Oral QHS  . senna-docusate  2 tablet Oral BID  . sodium bicarbonate  1,950 mg Oral TID  . sodium chloride flush  10-40 mL Intracatheter Q12H   acetaminophen **OR** acetaminophen, LORazepam, ondansetron **OR** ondansetron (ZOFRAN) IV, oxyCODONE, oxyCODONE-acetaminophen, promethazine, sodium  chloride, sodium chloride flush  Assessment/ Plan:  75 y.o.[place]uc[place] male with medical problems of CKD st 5 followed by [place] [facility] nephrology, long standing DM, HTN,CAD , who was admitted to [facility] on 12/10/2015 for evaluation of shortness of breath.   1. CKD st 5 2. SOB - acute pulm edema and /or pneumonia (WBC count not elevated, no fever) 3. Peripheral edema 4. DM-2 with CKD 5. HTN w/ CKD 6. Anemia of CKD. 7. Secondary hyperparathyroidism. 8. Acute myocardial infarction. 9. Metabolic acidosis.    - Patient remains on  high flow nasal cannula and he continues to have good urine output on Lasix drip.  Renal function worsening   Plan: From a respiratory perspective pt has improved significantly.  However with diuresis renal function has worsened.  Cardiology planning cardiac catherization on Monday.  He will likely need to be started on dialysis thereafter.  For now we will discontinue lasix and continue to monitor renal function closely.  We may need to consider starting PD sooner than anticipated given worsening uremia.  We would need to use a low volume supine PD approach.  We will follow pt closely with you.     LOS: 5 Todd [name] 9/28/201711:03 AM

## 2015-12-16 NOTE — Progress Notes (Signed)
Foley catheter removed as ordered,will monitor for voiding

## 2015-12-17 LAB — BASIC METABOLIC PANEL
Anion gap: 19 — ABNORMAL HIGH (ref 5–15)
BUN: 111 mg/dL — AB (ref 6–20)
CO2: 33 mmol/L — ABNORMAL HIGH (ref 22–32)
CREATININE: 8.45 mg/dL — AB (ref 0.61–1.24)
Calcium: 7.9 mg/dL — ABNORMAL LOW (ref 8.9–10.3)
Chloride: 86 mmol/L — ABNORMAL LOW (ref 101–111)
GFR calc Af Amer: 6 mL/min — ABNORMAL LOW (ref >60)
GFR calc non Af Amer: 5 mL/min — ABNORMAL LOW (ref >60)
GLUCOSE: 192 mg/dL — AB (ref 65–99)
Potassium: 3.5 mmol/L (ref 3.5–5.1)
Sodium: 138 mmol/L (ref 135–145)

## 2015-12-17 LAB — GLUCOSE, CAPILLARY
GLUCOSE-CAPILLARY: 192 mg/dL — AB (ref 65–99)
Glucose-Capillary: 210 mg/dL — ABNORMAL HIGH (ref 65–99)
Glucose-Capillary: 183 mg/dL — ABNORMAL HIGH (ref 65–99)
Glucose-Capillary: 180 mg/dL — ABNORMAL HIGH (ref 65–99)

## 2015-12-17 MED ORDER — SODIUM CHLORIDE 0.9 % IV BOLUS (SEPSIS)
250.0000 mL | Freq: Once | INTRAVENOUS | Status: AC
  Administered 2015-12-17: 250 mL via INTRAVENOUS

## 2015-12-17 MED ORDER — INSULIN GLARGINE 100 UNIT/ML ~~LOC~~ SOLN
10.0000 [IU] | Freq: Every day | SUBCUTANEOUS | Status: DC
  Administered 2015-12-17: 10 [IU] via SUBCUTANEOUS
  Filled 2015-12-17 (×2): qty 0.1

## 2015-12-17 MED ORDER — SODIUM CHLORIDE 0.9 % IV BOLUS (SEPSIS): 500.0000 mL | Freq: Once | INTRAVENOUS | Status: DC

## 2015-12-17 MED ORDER — DELFLEX-LC/2.5% DEXTROSE 394 MOSM/L IP SOLN
INTRAPERITONEAL | Status: DC
  Administered 2015-12-17 – 2015-12-19 (×3): 6 L via INTRAPERITONEAL
  Filled 2015-12-17 (×6): qty 3000

## 2015-12-17 MED ORDER — INSULIN ASPART 100 UNIT/ML ~~LOC~~ SOLN
5.0000 [IU] | Freq: Three times a day (TID) | SUBCUTANEOUS | Status: DC
  Administered 2015-12-17 – 2015-12-19 (×4): 5 [IU] via SUBCUTANEOUS
  Filled 2015-12-17 (×4): qty 5

## 2015-12-17 MED ORDER — GENTAMICIN SULFATE 0.1 % EX CREA
1.0000 "application " | TOPICAL_CREAM | Freq: Every day | CUTANEOUS | Status: DC
  Administered 2015-12-17 – 2015-12-19 (×4): 1 via TOPICAL
  Filled 2015-12-17: qty 15

## 2015-12-17 NOTE — Progress Notes (Signed)
Pharmacy Antibiotic Note  Todd [name] a75 y.o.maleadmittedon 9/22/2017with pneumonia. Pharmacy has been consulted for levofloxacin and cefepimedosing.  Plan: Current orders for cefepime 1gm IV Q24H and Levofloxacin 500mg  PO Q48H.  Renal function continues to decline - patient has been planning for PD in about a month but may need to start while inpatient. Please follow and adjust cefepime if HD/PD started.   Height: 5\' 9"  (175.3 cm) Weight: 175 lb 8 oz (79.6 kg) IBW/kg (Calculated) : 70.7  Temp (24hrs), Avg:98.3 F (36.8 C), Min:98 F (36.7 C), Max:98.8 F (37.1 C)   Recent Labs Lab 12/11/15 0110 12/11/15 0544  12/13/15 0402 12/14/15 0152 12/15/15 0547 12/16/15 0603 12/17/15 0522  WBC  --  9.4  --  7.4 6.3 4.8 5.5  --   CREATININE  --  4.74*  < > 5.54* 5.79* 6.83* 7.62* 8.45*  LATICACIDVEN 1.1  --   --   --   --   --   --   --   < > = values in this interval not displayed.  Estimated Creatinine Clearance: 7.7 mL/min (by C-G formula based on SCr of 8.45 mg/dL (H)).    Allergies  Allergen Reactions  . Centrum Other (See Comments)    Bloating, indigestion, chest pain  . Valsartan Other (See Comments)    Unsure of symptoms, was on list from previous hospital and patient was unsure of name of medicine.  . Vitamin D Analogs Other (See Comments)    Bloating and swelling in abdomen region, indigestion. Prescription vit d not otc    Antimicrobials this admission: vanc 9/23>>once cefepime 9/23>> levofloxacin 9/23 >>  Dose adjustments this admission: Cefepime 2 g was decreased to 1g IV q 24 due to renal function. Levofloxacin 750 q 24 was changed to 750 mg IV x 1 followed by 500 mg IV q 48 hours due to renal function. Levofloxacin 500 mg was changed from IV to PO  Microbiology results: 9/25 Sputum Cx: Sent 9/23MRSA PCR: negative  Thank you for allowing pharmacy to be a part of this patient's care.  Darrow Bussing, PharmD Pharmacy  Resident 12/17/2015 1:26 PM

## 2015-12-17 NOTE — Progress Notes (Signed)
Nurse tech witnessed an episode in which she reports the patient lost consciousness while sitting in his chair (had just gotten off BSC) for a few seconds. Patient came to and began aggressively dry heaving. It was at this point that this RN arrived in the room. Patient was abel to answer orientation questions appropriately, no reports of pain or discomfort, but did have some slurred speech for a minute before resolving completely. VSS, no changes noted on telemetry. Dr. Ether Griffins paged and aware, orders for orthostatics.

## 2015-12-17 NOTE — Progress Notes (Addendum)
[place] [facility] [place] NAME: Todd [name]    MR#:  [id number]  DATE OF BIRTH:  07/19/1940  SUBJECTIVE:  CHIEF COMPLAINT:   Chief Complaint  Patient presents with  . Shortness of Breath   - feels Satisfactory today, some nausea  - Cr worsening, urine output is still good - weaned off high flow nasal cannula, remains on 2L now. Denies any shortness of breath, lower extremity swelling. Nephrology is recommending peritoneal dialysis, to be started tonight. Cardiology is planning cardiac catheterization on Monday.  REVIEW OF SYSTEMS:  Review of Systems  Constitutional: Negative for chills, fever and malaise/fatigue.  HENT: Negative for ear discharge and ear pain.   Eyes: Negative for blurred vision and double vision.  Respiratory: Positive for cough, hemoptysis and shortness of breath. Negative for wheezing.   Cardiovascular: Negative for chest pain, palpitations and leg swelling.  Gastrointestinal: Positive for nausea and vomiting. Negative for abdominal pain, constipation and diarrhea.  Genitourinary: Negative for dysuria and urgency.  Musculoskeletal: Negative for myalgias.  Neurological: Negative for dizziness, sensory change, speech change, focal weakness, seizures and headaches.  Psychiatric/Behavioral: Negative for depression.    DRUG ALLERGIES:   Allergies  Allergen Reactions  . Centrum Other (See Comments)    Bloating, indigestion, chest pain  . Valsartan Other (See Comments)    Unsure of symptoms, was on list from previous hospital and patient was unsure of name of medicine.  . Vitamin D Analogs Other (See Comments)    Bloating and swelling in abdomen region, indigestion. Prescription vit d not otc    VITALS:  Blood pressure 123/64, pulse 80, temperature 98 F (36.7 C), resp. rate 18, height 5\' 9"  (1.753 m), weight 79.6 kg (175 lb 8 oz), SpO2 92 %.  PHYSICAL EXAMINATION:  Physical Exam  GENERAL:  75 y.o.-year-old  patient lying in the bed with no acute distress.  not tachypneic EYES: Pupils equal, round, reactive to light and accommodation. No scleral icterus. Extraocular muscles intact.  HEENT: Head atraumatic, normocephalic. Oropharynx and nasopharynx clear.  NECK:  Supple, no jugular venous distention. No thyroid enlargement, no tenderness.  LUNGS: Markedly decreased breath sounds bilaterally, no wheezing, fine crackles at the bases, no rhonchi or crepitation. No use of accessory muscles of respiration. Decreased bibasilar breath sounds noted CARDIOVASCULAR: S1, S2 normal. No murmurs, rubs, or gallops.  ABDOMEN: Soft, nontender, nondistended. Bowel sounds present. No organomegaly or mass. Peritoneal dialysis catheter present EXTREMITIES: No pedal edema, cyanosis, or clubbing.  NEUROLOGIC: Cranial nerves II through XII are intact. Muscle strength 5/5 in all extremities. Sensation intact. Gait not checked.  PSYCHIATRIC: The patient is alert and oriented x 3.  SKIN: No obvious rash, lesion, or ulcer.    LABORATORY PANEL:   CBC  Recent Labs Lab 12/16/15 0603  WBC 5.5  HGB 11.0*  HCT 32.1*  PLT 228   ------------------------------------------------------------------------------------------------------------------  Chemistries   Recent Labs Lab 12/10/15 2353  12/16/15 0603 12/17/15 0522  NA 142  < > 139 138  K 5.3*  < > 3.6 3.5  CL 115*  < > 88* 86*  CO2 16*  < > 34* 33*  GLUCOSE 173*  < > 175* 192*  BUN 71*  < > 105* 111*  CREATININE 4.58*  < > 7.62* 8.45*  CALCIUM 6.9*  < > 7.8* 7.9*  MG  --   < > 2.0  --   AST 25  --   --   --  ALT 16*  --   --   --   ALKPHOS 70  --   --   --   BILITOT 0.7  --   --   --   < > = values in this interval not displayed. ------------------------------------------------------------------------------------------------------------------  Cardiac Enzymes  Recent Labs Lab 12/13/15 2253  TROPONINI 12.82*    ------------------------------------------------------------------------------------------------------------------  RADIOLOGY:  Dg Abd 1 View  Result Date: 12/16/2015 CLINICAL DATA:  Nausea, vomiting EXAM: ABDOMEN - 1 VIEW COMPARISON:  12/14/2015 FINDINGS: The bowel gas pattern is normal. No radio-opaque calculi or other significant radiographic abnormality are seen. Suprapubic catheter is noted. IMPRESSION: Negative. Electronically Signed   By: Kathreen Devoid   On: 12/16/2015 15:25    EKG:   Orders placed or performed during the hospital encounter of 12/10/15  . ED EKG within 10 minutes  . ED EKG within 10 minutes  . EKG 12-Lead  . 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  #1 acute hypoxic respiratory failure-secondary to pneumonia and pulmonary edema -Off of high flow nasal cannula. Currently on 2 L nasal cannula. - encourage incentive spirometry -Blood cultures are not done on admission. 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 Has peritoneal dialysis catheter. Plan to start peritoneal dialysis tonight, follow clinically with not mature PD  catheter, may consider temporary hemodialysis catheter if peritoneal dialysis catheter does not work.  -Nephrology input is appreciated. Of diuretics  #4 DM- on SSI and low dose lantus, novolog tid started. Blood glucose is ranging between 180-210. Advance insulin Lantus to 12 units subcutaneously at bedtime, continue NovoLog.   #5 hypertension-on Norvasc and Coreg  #6 DVT Prophylaxis- SQ heparin  #7. Syncope due to orthostatic hypotension. Likely, patient will be receiving a bolus of IV fluids, follow blood pressure readings, orthostatic vital signs daily  Physical therapy consult, once off of high flow.   All the records are reviewed and case discussed with Care Management/Social Workerr. Management plans discussed with the patient, family and they are in agreement.  CODE STATUS: Full code  TOTAL TIME SPENT IN TAKING CARE OF THIS PATIENT: 40 minutes.  Discussed with Dr. Holley Raring POSSIBLE D/C IN 2-3 DAYS, DEPENDING ON CLINICAL CONDITION.   Theodoro Grist M.D on 12/17/2015 at 1:18 PM  Between 7am to 6pm - Pager - [phone]  After 6pm go to [email or web address] - password [email or web address] [place] [facility]  Office  [phone]  CC: Primary care physician; Leonel Ramsay, MD

## 2015-12-17 NOTE — Progress Notes (Signed)
Subjective:  Feeling relatively better no chest pain minimal shortness of breath preop for dialysis port  Objective:  Vital Signs in the last 24 hours: Temp:  [98 F (36.7 C)-98.8 F (37.1 C)] 98 F (36.7 C) (09/29 1145) Pulse Rate:  [80-90] 80 (09/29 1145) Resp:  [15-18] 18 (09/29 1145) BP: (123-148)/(64-66) 123/64 (09/29 1145) SpO2:  [86 %-94 %] 92 % (09/29 1145) Weight:  [79.6 kg (175 lb 8 oz)] 79.6 kg (175 lb 8 oz) (09/29 0446)  Intake/Output from previous day: 09/28 0701 - 09/29 0700 In: 500 [P.O.:480; I.V.:20] Out: 1110 [Urine:1110] Intake/Output from this shift: Total I/O In: 50 [IV Piggyback:50] Out: 350 [Urine:350]  Physical Exam: General appearance: appears stated age Neck: no adenopathy, no carotid bruit, no JVD, supple, symmetrical, trachea midline and thyroid not enlarged, symmetric, no tenderness/mass/nodules Lungs: clear to auscultation bilaterally Heart: regular rate and rhythm, S1, S2 normal, no murmur, click, rub or gallop Abdomen: soft, non-tender; bowel sounds normal; no masses,  no organomegaly Extremities: extremities normal, atraumatic, no cyanosis or edema Pulses: 2+ and symmetric Skin: Skin color, texture, turgor normal. No rashes or lesions Neurologic: Alert and oriented X 3, normal strength and tone. Normal symmetric reflexes. Normal coordination and gait  Lab Results:  Recent Labs  12/15/15 0547 12/16/15 0603  WBC 4.8 5.5  HGB 10.6* 11.0*  PLT 204 228    Recent Labs  12/16/15 0603 12/17/15 0522  NA 139 138  K 3.6 3.5  CL 88* 86*  CO2 34* 33*  GLUCOSE 175* 192*  BUN 105* 111*  CREATININE 7.62* 8.45*   No results for input(s): TROPONINI in the last 72 hours.  Invalid input(s): CK, MB Hepatic Function Panel No results for input(s): PROT, ALBUMIN, AST, ALT, ALKPHOS, BILITOT, BILIDIR, IBILI in the last 72 hours. No results for input(s): CHOL in the last 72 hours. No results for input(s): PROTIME in the last 72  hours.  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 PLAN Agree with PICC line Continue dialysis peritoneal Agree with dialysis port Continue supplemental oxygen Agree with inhalers as necessary Appreciated nephrology's input Continue blood pressure control with Coreg and Norvasc Consider cardiac catheter on Monday because of non-STEMI Agree with diabetes controlled with Humalog Lantus insulin Agree with DVT prophylaxis Continue Crestor therapy for hyperlipidemia   LOS: 6 days    Beryl Hornberger D. 12/17/2015, 12:41 PM

## 2015-12-17 NOTE — Progress Notes (Signed)
Inpatient Diabetes Program Recommendations  AACE/ADA: New Consensus Statement on Inpatient Glycemic Control (2015)  Target Ranges:  Prepandial:   less than 140 mg/dL      Peak postprandial:   less than 180 mg/dL (1-2 hours)      Critically ill patients:  140 - 180 mg/dL  Results for Todd [name], Todd [name] (MRN [id number]) as of 12/17/2015 08:12  Ref. Range 12/16/2015 07:28 12/16/2015 11:24 12/16/2015 16:09 12/16/2015 21:53 12/17/2015 07:37  Glucose-Capillary Latest Ref Range: 65 - 99 mg/dL 205 (H)  Novolog 6 units (3MC+SSI) 192 (H)  Novolog 5 units (3MC+SSI) 176 (H)  Novolog 5 units (3MC+SSI) 207 (H)  Novolog 2 units  Lantus 8 units 210 (H)    Review of Glycemic Control  Diabetes history:DM2 Outpatient Diabetes medications: Lantus 5 -8 units QHS (5 units on Sunday, Tuesday, Thursday and 8 units Monday, Wednesday, Friday, and Saturday), Humalog 5 units with lunch if CBG > 150 mg/dl Current orders for Inpatient glycemic control: Lantus 8 units QHS, Novolog 0-9 units TID with meals, Novolog 0-5 units QHS, Novolog 3 units TID with meals  Inpatient Diabetes Program Recommendations: Insulin - Basal:Fasting glucose over 200 mg/dl for past 2 days. Please consider increasing Lantus to 9 units QHS. Insulin - Meal Coverage: Post prandial glucose remains elevated despite Novolog 3 units TID with meals. Please consider increasing meal coverage to 5 units TID with meals.  Thanks, Barnie Alderman, RN, MSN, CDE Diabetes Coordinator Inpatient Diabetes Program [phone] (Team Pager from [place] to [place]) [phone] ([facility] office) [phone] [facility] office) [phone] [facility] office)

## 2015-12-17 NOTE — Progress Notes (Signed)
Subjective:  BUN and creatinine continued unfortunately worsened. Creatinine up to 8.45. Discussed care of the patient with his outpatient nephrologist from [facility] nephrology. We have decided to proceed with renal replacement therapy in the form of urgent start peritoneal dialysis. This will involve the patient remaining supine using Glofil volume peritoneal dialysis. Patient is agreeable to this plan.  Objective:  Vital signs in last 24 hours:  Temp:  [98 F (36.7 C)-98.8 F (37.1 C)] 98 F (36.7 C) (09/29 1145) Pulse Rate:  [80-90] 80 (09/29 1145) Resp:  [15-18] 18 (09/29 1145) BP: (123-148)/(64-66) 123/64 (09/29 1145) SpO2:  [86 %-94 %] 92 % (09/29 1145) Weight:  [79.6 kg (175 lb 8 oz)] 79.6 kg (175 lb 8 oz) (09/29 0446)  Weight change: 0.272 kg (9.6 oz) Filed Weights   12/15/15 0453 12/16/15 0321 12/17/15 0446  Weight: 81.5 kg (179 lb 10.8 oz) 79.3 kg (174 lb 14.4 oz) 79.6 kg (175 lb 8 oz)    Intake/Output:    Intake/Output Summary (Last 24 hours) at 12/17/15 1234 Last data filed at 12/17/15 1229  Gross per 24 hour  Intake              310 ml  Output             1160 ml  Net             -850 ml     Physical Exam: General: No acute distress, sitting in the bed   HEENT Anicteric, moist oral mucous membranes, regular nasal cannula   Neck Supple   Pulm/lungs CTAB normal effort   CVS/Heart S1S2 no rubs  Abdomen:  Soft, nontender, nondistended, PD catheter in place   Extremities: Trace lower extremity edema   Neurologic: Alert, oriented x 3 following commands  Skin: No acute rashes    Foley in place        Basic Metabolic Panel:   Recent Labs Lab 12/13/15 0402 12/14/15 0152 12/15/15 0547 12/16/15 0603 12/17/15 0522  NA 140 140 141 139 138  K 3.7 3.9 3.6 3.6 3.5  CL 104 97* 93* 88* 86*  CO2 28 31 33* 34* 33*  GLUCOSE 163* 151* 192* 175* 192*  BUN 75* 87* 98* 105* 111*  CREATININE 5.54* 5.79* 6.83* 7.62* 8.45*  CALCIUM 7.6* 7.4* 7.8* 7.8* 7.9*  MG 1.5*  1.7 1.6* 2.0  --   PHOS 5.9*  --  5.9*  --   --      CBC:  Recent Labs Lab 12/11/15 0544 12/13/15 0402 12/14/15 0152 12/15/15 0547 12/16/15 0603  WBC 9.4 7.4 6.3 4.8 5.5  HGB 10.5* 10.8* 10.4* 10.6* 11.0*  HCT 30.2* 30.6* 30.2* 29.4* 32.1*  MCV 94.8 92.8 92.2 91.3 92.9  PLT 184 176 195 204 228      Microbiology:  Recent Results (from the past 720 hour(s))  MRSA PCR Screening     Status: None   Collection Time: 12/11/15 11:47 AM  Result Value Ref Range Status   MRSA by PCR NEGATIVE NEGATIVE Final    Comment:        The GeneXpert MRSA Assay (FDA approved for NASAL specimens only), is one component of a comprehensive MRSA colonization surveillance program. It is not intended to diagnose MRSA infection nor to guide or monitor treatment for MRSA infections.   Culture, expectorated sputum-assessment     Status: None   Collection Time: 12/13/15  4:00 PM  Result Value Ref Range Status   Specimen Description SPUTUM  Final  Special Requests NONE  Final   Sputum evaluation   Final    Sputum specimen not acceptable for testing.  Please recollect.   SPOKE TO Tia Masker 12/13/15 @ 1955  [place]    Report Status 12/13/2015 FINAL  Final    Coagulation Studies: No results for input(s): LABPROT, INR in the last 72 hours.  Urinalysis: No results for input(s): COLORURINE, LABSPEC, PHURINE, GLUCOSEU, HGBUR, BILIRUBINUR, KETONESUR, PROTEINUR, UROBILINOGEN, NITRITE, LEUKOCYTESUR in the last 72 hours.  Invalid input(s): APPERANCEUR    Imaging: Dg Abd 1 View  Result Date: 12/16/2015 CLINICAL DATA:  Nausea, vomiting EXAM: ABDOMEN - 1 VIEW COMPARISON:  12/14/2015 FINDINGS: The bowel gas pattern is normal. No radio-opaque calculi or other significant radiographic abnormality are seen. Suprapubic catheter is noted. IMPRESSION: Negative. Electronically Signed   By: Kathreen Devoid   On: 12/16/2015 15:25     Medications:     . amLODipine  10 mg Oral Daily  . aspirin EC  81  mg Oral Daily  . carvedilol  6.25 mg Oral BID WC  . ceFEPime (MAXIPIME) IV  1 g Intravenous Q24H  . dialysis solution 2.5% low-MG/low-CA   Intraperitoneal Q24H  . gentamicin cream  1 application Topical Daily  . heparin subcutaneous  5,000 Units Subcutaneous Q8H  . insulin aspart  0-5 Units Subcutaneous QHS  . insulin aspart  0-9 Units Subcutaneous TID WC  . insulin aspart  3 Units Subcutaneous TID WC  . insulin glargine  8 Units Subcutaneous QHS  . levofloxacin  500 mg Oral Q48H  . mouth rinse  15 mL Mouth Rinse BID  . polyethylene glycol  17 g Oral Daily  . rosuvastatin  20 mg Oral QHS  . senna-docusate  2 tablet Oral BID  . sodium bicarbonate  1,950 mg Oral TID  . sodium chloride flush  10-40 mL Intracatheter Q12H   acetaminophen **OR** acetaminophen, bisacodyl, LORazepam, ondansetron **OR** ondansetron (ZOFRAN) IV, oxyCODONE, oxyCODONE-acetaminophen, promethazine, sodium chloride, sodium chloride flush  Assessment/ Plan:  75 y.o.[place]ucs[place] male with medical problems of CKD st 5 followed by [place] [facility] nephrology, long standing DM, HTN,CAD , who was admitted to [facility] on 12/10/2015 for evaluation of shortness of breath.   1. CKD st 5 2. SOB - acute pulm edema and /or pneumonia (WBC count not elevated, no fever) 3. Peripheral edema 4. DM-2 with CKD 5. HTN w/ CKD 6. Anemia of CKD. 7. Secondary hyperparathyroidism. 8. Acute myocardial infarction. 9. Metabolic acidosis.    - Patient remains on high flow nasal cannula and he continues to have good urine output on Lasix drip.  Renal function worsening   Plan: Renal function unfortunately continues to deteriorate.  As such we will initiate the patient on supine low volume peritoneal dialysis.  We will use 2.5% dextrose solution.  Monitor ultrafiltration closely with this strategy.  In addition the patient will be due for cardiac catheterization on Monday.  We will continue to monitor the patient's progress very closely. We plan to  hold off on Epogen for now given recent myocardial infarction.  Also continue to periodically monitor bone mineral metabolism parameters.   LOS: 6 Nadine Ryle 9/29/201712:34 PM

## 2015-12-17 NOTE — Progress Notes (Signed)
SATURATION QUALIFICATIONS: (This note is used to comply with regulatory documentation for home oxygen)  Patient Saturations on Room Air at Rest = 86%  Patient Saturations on Room Air while Ambulating = n/a%  Patient Saturations on n/a Liters of oxygen while Ambulating = n/a%  Please briefly explain why patient needs home oxygen: 

## 2015-12-17 NOTE — Care Management Important Message (Signed)
Important Message  Patient Details  Name: Todd [name] MRN: [id number] Date of Birth: 01-29-1941   Medicare Important Message Given:  Yes    Katrina Stack, RN 12/17/2015, 11:45 AM

## 2015-12-17 NOTE — Progress Notes (Signed)
Orthostatics positive, Dr. Ether Griffins aware, to place orders.

## 2015-12-18 LAB — GLUCOSE, CAPILLARY
GLUCOSE-CAPILLARY: 269 mg/dL — AB (ref 65–99)
GLUCOSE-CAPILLARY: 315 mg/dL — AB (ref 65–99)
Glucose-Capillary: 193 mg/dL — ABNORMAL HIGH (ref 65–99)
Glucose-Capillary: 304 mg/dL — ABNORMAL HIGH (ref 65–99)

## 2015-12-18 MED ORDER — INSULIN GLARGINE 100 UNIT/ML ~~LOC~~ SOLN
12.0000 [IU] | Freq: Every day | SUBCUTANEOUS | Status: DC
  Administered 2015-12-18: 12 [IU] via SUBCUTANEOUS
  Filled 2015-12-18 (×2): qty 0.12

## 2015-12-18 NOTE — Progress Notes (Signed)
Pt. Has had very little sleep tonight R/T to PD machine alarming R/T low drainage. Pt. Repositioned, bed  Elevated to help gravity and line checked for kinks, machine would begin again and dialysis would restart. Dialysis nurse called multiple times to trouble shoot PD machine.  Will continue to monitor pt.

## 2015-12-18 NOTE — Progress Notes (Signed)
PD completed this am 10 oz taken off patient. He is on for PD nopw Juanda Crumble the dialysis nurse set him up   Wife went home after staying here all night. Pt is not eating very weak and does not take po well without the emesis bag.

## 2015-12-18 NOTE — Progress Notes (Signed)
[place] [facility] [place] NAME: Todd [name]    MR#:  [id number]  DATE OF BIRTH:  03-11-1941  SUBJECTIVE:  CHIEF COMPLAINT:   Chief Complaint  Patient presents with  . Shortness of Breath   - feels Satisfactory today, some nausea  - Cr worsening, urine output is still good, About 850 cc over the past 24 hours. The patient was initiated on peritoneal dialysis last night, tolerated it well, however, did not sleep due to the machine beeping - weaned off high flow nasal cannula, remains on 2-3L now. Denies any shortness of breath, lower extremity swelling. Cardiology is planning cardiac catheterization on Monday. No pain or discomfort. Overall. Poor oral intake  REVIEW OF SYSTEMS:  Review of Systems  Constitutional: Negative for chills, fever and malaise/fatigue.  HENT: Negative for ear discharge and ear pain.   Eyes: Negative for blurred vision and double vision.  Respiratory: Positive for cough, hemoptysis and shortness of breath. Negative for wheezing.   Cardiovascular: Negative for chest pain, palpitations and leg swelling.  Gastrointestinal: Positive for nausea and vomiting. Negative for abdominal pain, constipation and diarrhea.  Genitourinary: Negative for dysuria and urgency.  Musculoskeletal: Negative for myalgias.  Neurological: Negative for dizziness, sensory change, speech change, focal weakness, seizures and headaches.  Psychiatric/Behavioral: Negative for depression.    DRUG ALLERGIES:   Allergies  Allergen Reactions  . Centrum Other (See Comments)    Bloating, indigestion, chest pain  . Valsartan Other (See Comments)    Unsure of symptoms, was on list from previous hospital and patient was unsure of name of medicine.  . Vitamin D Analogs Other (See Comments)    Bloating and swelling in abdomen region, indigestion. Prescription vit d not otc    VITALS:  Blood pressure 135/74, pulse 73, temperature 98.4 F (36.9 C),  temperature source Oral, resp. rate 16, height 5\' 9"  (1.753 m), weight 80.5 kg (177 lb 6 oz), SpO2 92 %.  PHYSICAL EXAMINATION:  Physical Exam  GENERAL:  75 y.o.-year-old patient lying in the bed with no acute distress.  not tachypneic EYES: Pupils equal, round, reactive to light and accommodation. No scleral icterus. Extraocular muscles intact.  HEENT: Head atraumatic, normocephalic. Oropharynx and nasopharynx clear.  NECK:  Supple, no jugular venous distention. No thyroid enlargement, no tenderness.  LUNGS: Markedly decreased breath sounds bilaterally, no wheezing, fine crackles at the bases, no rhonchi or crepitation. No use of accessory muscles of respiration. Decreased bibasilar breath sounds noted CARDIOVASCULAR: S1, S2 normal. No murmurs, rubs, or gallops.  ABDOMEN: Soft, nontender, nondistended. Bowel sounds present. No organomegaly or mass. Peritoneal dialysis catheter present EXTREMITIES: No pedal edema, cyanosis, or clubbing.  NEUROLOGIC: Cranial nerves II through XII are intact. Muscle strength 5/5 in all extremities. Sensation intact. Gait not checked.  PSYCHIATRIC: The patient is alert and oriented x 3.  SKIN: No obvious rash, lesion, or ulcer.    LABORATORY PANEL:   CBC  Recent Labs Lab 12/16/15 0603  WBC 5.5  HGB 11.0*  HCT 32.1*  PLT 228   ------------------------------------------------------------------------------------------------------------------  Chemistries   Recent Labs Lab 12/16/15 0603 12/17/15 0522  NA 139 138  K 3.6 3.5  CL 88* 86*  CO2 34* 33*  GLUCOSE 175* 192*  BUN 105* 111*  CREATININE 7.62* 8.45*  CALCIUM 7.8* 7.9*  MG 2.0  --    ------------------------------------------------------------------------------------------------------------------  Cardiac Enzymes  Recent Labs Lab 12/13/15 2253  TROPONINI 12.82*    ------------------------------------------------------------------------------------------------------------------  RADIOLOGY:  Dg  Abd 1 View  Result Date: 12/16/2015 CLINICAL DATA:  Nausea, vomiting EXAM: ABDOMEN - 1 VIEW COMPARISON:  12/14/2015 FINDINGS: The bowel gas pattern is normal. No radio-opaque calculi or other significant radiographic abnormality are seen. Suprapubic catheter is noted. IMPRESSION: Negative. Electronically Signed   By: Kathreen Devoid   On: 12/16/2015 15:25    EKG:   Orders placed or performed during the hospital encounter of 12/10/15  . ED EKG within 10 minutes  . ED EKG within 10 minutes  . EKG 12-Lead  . 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  #1 acute hypoxic respiratory failure-secondary to pneumonia and pulmonary edema -Off of high flow nasal cannula. Currently on 2-3 L nasal cannula. - encouraged incentive spirometry -Blood cultures are not done on admission. 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 -As patient is started on dialysis, cardiac catheterization will be done on Monday - continue coreg and statin  #3 CKD stage V-progressed to end-stage renal disease. Has peritoneal dialysis catheter. Started peritoneal dialysis last  night, stable clinically with not mature PD catheter, may consider temporary hemodialysis catheter if peritoneal dialysis catheter does not work.  -Nephrology input is appreciated. Of diuretics  #4 DM- on SSI and low dose lantus, novolog tid started. Blood glucose is ranging between 180-210. Advance insulin Lantus to 12 units subcutaneously at bedtime today, continue NovoLog.   #5 hypertension-on Norvasc and Coreg  #6 DVT Prophylaxis- SQ heparin  #7. Syncope due to orthostatic hypotension , the  patient  received a bolus of IV fluids yesterday, follow blood pressure readings, orthostatic vital signs daily  #8. Generalized weakness, Physical therapy consult  All the records are reviewed and case discussed with Care Management/Social Workerr. Management plans discussed with the patient, family and they are in agreement.  CODE STATUS: Full code  TOTAL TIME SPENT IN TAKING CARE OF THIS PATIENT: 45 minutes.  Discussed with Dr. Holley Raring POSSIBLE D/C IN 2-3 DAYS, DEPENDING ON CLINICAL CONDITION.   Theodoro Grist M.D on 12/18/2015 at 12:20 PM  Between 7am to 6pm - Pager - [phone]  After 6pm go to [email or web address] - password [email or web address] [facility]  Office  [phone]  CC: Primary care physician; Leonel Ramsay, MD

## 2015-12-18 NOTE — Progress Notes (Signed)
Pt on PD dialysis. A few clitches Todd [name] is aware of. Wife at bedside. Pt will eat magic cup otherwise po intake is very scant. Noon insulin held because he was not eating at all or taking any kind of fluid other than water sips.

## 2015-12-18 NOTE — Progress Notes (Signed)
PD started 

## 2015-12-18 NOTE — Progress Notes (Signed)
Pt. Signed consent for peritoneal dialysis. Consent is on chart. Dialysis nurse explained in detail procedure to pt. Marland Kitchen

## 2015-12-18 NOTE — Progress Notes (Signed)
Post PD assessment 

## 2015-12-18 NOTE — Progress Notes (Signed)
Unable to obtain orthostatic blood pressures R/T PD attachment. Will pass along to day shift so once pt. Is disconnected from PD, he can have ortho statics taken.

## 2015-12-18 NOTE — Progress Notes (Signed)
Subjective:  Patient seen at bedside. He was started on peritoneal dialysis yesterday. There was some drainage issues with the peritoneal dialysis cycler. After raising the bed this problem seemed to have improved. We have reinitiated the patient on peritoneal dialysis during the day to address any issues that might occur. Thus far this seems to be progressing well.  Objective:  Vital signs in last 24 hours:  Temp:  [97.6 F (36.4 C)-98.5 F (36.9 C)] 98.4 F (36.9 C) (09/30 1105) Pulse Rate:  [73-84] 73 (09/30 1105) Resp:  [16] 16 (09/30 1105) BP: (131-155)/(60-74) 135/74 (09/30 1105) SpO2:  [92 %-99 %] 92 % (09/30 1105) Weight:  [80.5 kg (177 lb 6 oz)] 80.5 kg (177 lb 6 oz) (09/30 0500)  Weight change: 0.85 kg (1 lb 14 oz) Filed Weights   12/16/15 0321 12/17/15 0446 12/18/15 0500  Weight: 79.3 kg (174 lb 14.4 oz) 79.6 kg (175 lb 8 oz) 80.5 kg (177 lb 6 oz)    Intake/Output:    Intake/Output Summary (Last 24 hours) at 12/18/15 1533 Last data filed at 12/18/15 1526  Gross per 24 hour  Intake              250 ml  Output             1215 ml  Net             -965 ml     Physical Exam: General: No acute distress, sitting in the bed   HEENT Anicteric, moist oral mucous membranes  Neck Supple   Pulm/lungs CTAB normal effort   CVS/Heart S1S2 no rubs  Abdomen:  Soft, nontender, nondistended, PD catheter in place   Extremities: No lower extremity edema   Neurologic: Alert, oriented x 3 following commands  Skin: No acute rashes    Foley in place        Basic Metabolic Panel:   Recent Labs Lab 12/13/15 0402 12/14/15 0152 12/15/15 0547 12/16/15 0603 12/17/15 0522  NA 140 140 141 139 138  K 3.7 3.9 3.6 3.6 3.5  CL 104 97* 93* 88* 86*  CO2 28 31 33* 34* 33*  GLUCOSE 163* 151* 192* 175* 192*  BUN 75* 87* 98* 105* 111*  CREATININE 5.54* 5.79* 6.83* 7.62* 8.45*  CALCIUM 7.6* 7.4* 7.8* 7.8* 7.9*  MG 1.5* 1.7 1.6* 2.0  --   PHOS 5.9*  --  5.9*  --   --       CBC:  Recent Labs Lab 12/13/15 0402 12/14/15 0152 12/15/15 0547 12/16/15 0603  WBC 7.4 6.3 4.8 5.5  HGB 10.8* 10.4* 10.6* 11.0*  HCT 30.6* 30.2* 29.4* 32.1*  MCV 92.8 92.2 91.3 92.9  PLT 176 195 204 228      Microbiology:  Recent Results (from the past 720 hour(s))  MRSA PCR Screening     Status: None   Collection Time: 12/11/15 11:47 AM  Result Value Ref Range Status   MRSA by PCR NEGATIVE NEGATIVE Final    Comment:        The GeneXpert MRSA Assay (FDA approved for NASAL specimens only), is one component of a comprehensive MRSA colonization surveillance program. It is not intended to diagnose MRSA infection nor to guide or monitor treatment for MRSA infections.   Culture, expectorated sputum-assessment     Status: None   Collection Time: 12/13/15  4:00 PM  Result Value Ref Range Status   Specimen Description SPUTUM  Final   Special Requests NONE  Final  Sputum evaluation   Final    Sputum specimen not acceptable for testing.  Please recollect.   SPOKE TO Tia Masker 12/13/15 @ 1955  Cucumber    Report Status 12/13/2015 FINAL  Final    Coagulation Studies: No results for input(s): LABPROT, INR in the last 72 hours.  Urinalysis: No results for input(s): COLORURINE, LABSPEC, PHURINE, GLUCOSEU, HGBUR, BILIRUBINUR, KETONESUR, PROTEINUR, UROBILINOGEN, NITRITE, LEUKOCYTESUR in the last 72 hours.  Invalid input(s): APPERANCEUR    Imaging: No results found.   Medications:     . amLODipine  10 mg Oral Daily  . aspirin EC  81 mg Oral Daily  . carvedilol  6.25 mg Oral BID WC  . dialysis solution 2.5% low-MG/low-CA   Intraperitoneal Q24H  . gentamicin cream  1 application Topical Daily  . heparin subcutaneous  5,000 Units Subcutaneous Q8H  . insulin aspart  0-5 Units Subcutaneous QHS  . insulin aspart  0-9 Units Subcutaneous TID WC  . insulin aspart  5 Units Subcutaneous TID WC  . insulin glargine  12 Units Subcutaneous QHS  . levofloxacin  500 mg  Oral Q48H  . mouth rinse  15 mL Mouth Rinse BID  . polyethylene glycol  17 g Oral Daily  . rosuvastatin  20 mg Oral QHS  . senna-docusate  2 tablet Oral BID  . sodium bicarbonate  1,950 mg Oral TID  . sodium chloride flush  10-40 mL Intracatheter Q12H   acetaminophen **OR** acetaminophen, bisacodyl, LORazepam, ondansetron **OR** ondansetron (ZOFRAN) IV, oxyCODONE, oxyCODONE-acetaminophen, promethazine, sodium chloride, sodium chloride flush  Assessment/ Plan:  75 y.o.caucsian male with medical problems of CKD st 5 followed by [facility] nephrology, long standing DM, HTN,CAD , who was admitted to [facility] on 12/10/2015 for evaluation of shortness of breath.   1.  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  Patient has been started on peritoneal dialysis. We will continue to use low fill volume and supine peritoneal dialysis on the cycler. Continue to use 2.5% dextrose solution.  2. Anemia of chronic kidney disease. Hemoglobin currently 11.0. Hold off on administering Procrit.  3. Pulmonary edema. This has significantly improved with the use of Lasix. Patient also started on peritoneal dialysis. Monitor ultrafiltration with peritoneal dialysis.  4. Acute myocardial infarction. The patient will be undergoing cardiac catheterization on Monday.  5. Hypertension. Continue amlodipine, carvedilol    LOS: 7 Todd [name] 9/30/20173:33 PM

## 2015-12-19 LAB — BASIC METABOLIC PANEL
Anion gap: 15 (ref 5–15)
BUN: 112 mg/dL — ABNORMAL HIGH (ref 6–20)
CALCIUM: 7.9 mg/dL — AB (ref 8.9–10.3)
CO2: 37 mmol/L — AB (ref 22–32)
CREATININE: 8.68 mg/dL — AB (ref 0.61–1.24)
Chloride: 87 mmol/L — ABNORMAL LOW (ref 101–111)
GFR, EST AFRICAN AMERICAN: 6 mL/min — AB (ref >60)
GFR, EST NON AFRICAN AMERICAN: 5 mL/min — AB (ref >60)
GLUCOSE: 245 mg/dL — AB (ref 65–99)
Potassium: 3.1 mmol/L — ABNORMAL LOW (ref 3.5–5.1)
Sodium: 139 mmol/L (ref 135–145)

## 2015-12-19 LAB — GLUCOSE, CAPILLARY
GLUCOSE-CAPILLARY: 253 mg/dL — AB (ref 65–99)
GLUCOSE-CAPILLARY: 273 mg/dL — AB (ref 65–99)
GLUCOSE-CAPILLARY: 312 mg/dL — AB (ref 65–99)
GLUCOSE-CAPILLARY: 366 mg/dL — AB (ref 65–99)

## 2015-12-19 LAB — CBC
HCT: 33.2 % — ABNORMAL LOW (ref 40.0–52.0)
Hemoglobin: 11.4 g/dL — ABNORMAL LOW (ref 13.0–18.0)
MCH: 31.9 pg (ref 26.0–34.0)
MCHC: 34.2 g/dL (ref 32.0–36.0)
MCV: 93.1 fL (ref 80.0–100.0)
PLATELETS: 275 10*3/uL (ref 150–440)
RBC: 3.57 MIL/uL — AB (ref 4.40–5.90)
RDW: 12.3 % (ref 11.5–14.5)
WBC: 7 10*3/uL (ref 3.8–10.6)

## 2015-12-19 MED ORDER — SODIUM CHLORIDE 0.9 % IV BOLUS (SEPSIS)
500.0000 mL | Freq: Once | INTRAVENOUS | Status: AC
  Administered 2015-12-19: 500 mL via INTRAVENOUS

## 2015-12-19 MED ORDER — ENSURE ENLIVE PO LIQD
237.0000 mL | Freq: Two times a day (BID) | ORAL | Status: DC
  Administered 2015-12-19 – 2015-12-24 (×11): 237 mL via ORAL

## 2015-12-19 MED ORDER — INSULIN ASPART 100 UNIT/ML ~~LOC~~ SOLN
6.0000 [IU] | Freq: Three times a day (TID) | SUBCUTANEOUS | Status: DC
  Administered 2015-12-19 – 2015-12-24 (×16): 6 [IU] via SUBCUTANEOUS
  Filled 2015-12-19 (×16): qty 6

## 2015-12-19 MED ORDER — INSULIN GLARGINE 100 UNIT/ML ~~LOC~~ SOLN
15.0000 [IU] | Freq: Every day | SUBCUTANEOUS | Status: DC
  Administered 2015-12-19: 15 [IU] via SUBCUTANEOUS
  Filled 2015-12-19 (×2): qty 0.15

## 2015-12-19 NOTE — Progress Notes (Signed)
Subjective:  Patient seen at bedside. We continue to have problems with draining with the peritoneal dialysis catheter. He has to sit up in order for the peritoneal dialysis catheter to drain. Overall patient appears to be quite weak. He will most likely need rehabilitation which will require transition to hemodialysis.  Objective:  Vital signs in last 24 hours:  Temp:  [97.5 F (36.4 C)-98.2 F (36.8 C)] 98 F (36.7 C) (10/01 1131) Pulse Rate:  [77-87] 78 (10/01 1327) Resp:  [18-19] 19 (10/01 1131) BP: (89-142)/(41-71) 89/41 (10/01 1327) SpO2:  [89 %-95 %] 94 % (10/01 1131) Weight:  [78.7 kg (173 lb 8 oz)] 78.7 kg (173 lb 8 oz) (10/01 0444)  Weight change: -1.758 kg (-3 lb 14 oz) Filed Weights   12/17/15 0446 12/18/15 0500 12/19/15 0444  Weight: 79.6 kg (175 lb 8 oz) 80.5 kg (177 lb 6 oz) 78.7 kg (173 lb 8 oz)    Intake/Output:    Intake/Output Summary (Last 24 hours) at 12/19/15 1741 Last data filed at 12/19/15 1450  Gross per 24 hour  Intake              840 ml  Output              666 ml  Net              174 ml     Physical Exam: General: No acute distress, sitting in the bed   HEENT Anicteric, moist oral mucous membranes  Neck Supple   Pulm/lungs CTAB normal effort   CVS/Heart S1S2 no rubs  Abdomen:  Soft, nontender, nondistended, PD catheter in place   Extremities: No lower extremity edema   Neurologic: Alert, oriented x 3 following commands  Skin: No acute rashes    Foley in place        Basic Metabolic Panel:   Recent Labs Lab 12/13/15 0402 12/14/15 0152 12/15/15 0547 12/16/15 0603 12/17/15 0522 12/19/15 0425  NA 140 140 141 139 138 139  K 3.7 3.9 3.6 3.6 3.5 3.1*  CL 104 97* 93* 88* 86* 87*  CO2 28 31 33* 34* 33* 37*  GLUCOSE 163* 151* 192* 175* 192* 245*  BUN 75* 87* 98* 105* 111* 112*  CREATININE 5.54* 5.79* 6.83* 7.62* 8.45* 8.68*  CALCIUM 7.6* 7.4* 7.8* 7.8* 7.9* 7.9*  MG 1.5* 1.7 1.6* 2.0  --   --   PHOS 5.9*  --  5.9*  --   --    --      CBC:  Recent Labs Lab 12/13/15 0402 12/14/15 0152 12/15/15 0547 12/16/15 0603 12/19/15 0425  WBC 7.4 6.3 4.8 5.5 7.0  HGB 10.8* 10.4* 10.6* 11.0* 11.4*  HCT 30.6* 30.2* 29.4* 32.1* 33.2*  MCV 92.8 92.2 91.3 92.9 93.1  PLT 176 195 204 228 275      Microbiology:  Recent Results (from the past 720 hour(s))  MRSA PCR Screening     Status: None   Collection Time: 12/11/15 11:47 AM  Result Value Ref Range Status   MRSA by PCR NEGATIVE NEGATIVE Final    Comment:        The GeneXpert MRSA Assay (FDA approved for NASAL specimens only), is one component of a comprehensive MRSA colonization surveillance program. It is not intended to diagnose MRSA infection nor to guide or monitor treatment for MRSA infections.   Culture, expectorated sputum-assessment     Status: None   Collection Time: 12/13/15  4:00 PM  Result Value Ref Range  Status   Specimen Description SPUTUM  Final   Special Requests NONE  Final   Sputum evaluation   Final    Sputum specimen not acceptable for testing.  Please recollect.   SPOKE TO Tia Masker 12/13/15 @ 1955  [facility]    Report Status 12/13/2015 FINAL  Final    Coagulation Studies: No results for input(s): LABPROT, INR in the last 72 hours.  Urinalysis: No results for input(s): COLORURINE, LABSPEC, PHURINE, GLUCOSEU, HGBUR, BILIRUBINUR, KETONESUR, PROTEINUR, UROBILINOGEN, NITRITE, LEUKOCYTESUR in the last 72 hours.  Invalid input(s): APPERANCEUR    Imaging: No results found.   Medications:     . amLODipine  10 mg Oral Daily  . aspirin EC  81 mg Oral Daily  . carvedilol  6.25 mg Oral BID WC  . dialysis solution 2.5% low-MG/low-CA   Intraperitoneal Q24H  . feeding supplement (ENSURE ENLIVE)  237 mL Oral BID BM  . gentamicin cream  1 application Topical Daily  . heparin subcutaneous  5,000 Units Subcutaneous Q8H  . insulin aspart  0-5 Units Subcutaneous QHS  . insulin aspart  0-9 Units Subcutaneous TID WC  . insulin  aspart  6 Units Subcutaneous TID WC  . insulin glargine  15 Units Subcutaneous QHS  . levofloxacin  500 mg Oral Q48H  . mouth rinse  15 mL Mouth Rinse BID  . polyethylene glycol  17 g Oral Daily  . rosuvastatin  20 mg Oral QHS  . senna-docusate  2 tablet Oral BID  . sodium bicarbonate  1,950 mg Oral TID  . sodium chloride  500 mL Intravenous Once  . sodium chloride flush  10-40 mL Intracatheter Q12H   acetaminophen **OR** acetaminophen, bisacodyl, LORazepam, ondansetron **OR** ondansetron (ZOFRAN) IV, oxyCODONE, oxyCODONE-acetaminophen, promethazine, sodium chloride, sodium chloride flush  Assessment/ Plan:  75 y.o.caucsian male with medical problems of CKD st 5 followed by [facility] nephrology, long standing DM, HTN,CAD , who was admitted to [facility] on 12/10/2015 for evaluation of shortness of breath.   1.  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  Patient had peritoneal dialysis catheter placed last Friday at [facility]. Thereafter he was admitted here for acute myocardial infarction. We have started the patient on peritoneal dialysis urgently using low fill volumes. Unfortunately the peritoneal dialysis catheter is not draining very well. Patient has to be sitting up in order for catheter to drain. Peritoneal dialysis catheter has not leaked. However overall the patient appears to be quite weak and will likely need rehabilitation. As such the patient will likely need transitioned to hemodialysis. This was discussed with [facility] nephrology. Consider placing a PermCath next week after her cardiac catheterization.  2. Anemia of chronic kidney disease.  Hemoglobin 11.4. Continue to hold off on Procrit.  3. Pulmonary edema.   Significantly improved with the use of Lasix last week. Lasix was stopped as renal function was worsening.  4. Acute myocardial infarction. The patient will be undergoing cardiac catheterization on Monday.  5. Hypertension. Continue amlodipine, carvedilol    LOS:  74 Hersh Minney 10/1/20175:41 PM

## 2015-12-19 NOTE — Progress Notes (Signed)
Subjective:  Patient doing much better ready for dialysis therapy feels like he is ready to go home denies any chest pain shortness of breath much improved  Objective:  Vital Signs in the last 24 hours: Temp:  [97.5 F (36.4 C)-98.4 F (36.9 C)] 97.5 F (36.4 C) (10/01 0444) Pulse Rate:  [73-87] 87 (10/01 0444) Resp:  [16-18] 18 (10/01 0444) BP: (128-142)/(70-74) 142/71 (10/01 0444) SpO2:  [89 %-95 %] 95 % (10/01 0444) Weight:  [78.7 kg (173 lb 8 oz)] 78.7 kg (173 lb 8 oz) (10/01 0444)  Intake/Output from previous day: 09/30 0701 - 10/01 0700 In: 360 [P.O.:360] Out: 1031 [Urine:650; Stool:1] Intake/Output from this shift: Total I/O In: -  Out: 200 [Urine:200]  Physical Exam: General appearance: appears stated age Neck: no adenopathy, no carotid bruit, no JVD, supple, symmetrical, trachea midline and thyroid not enlarged, symmetric, no tenderness/mass/nodules Lungs: diminished breath sounds no wheezing scattered rhonchi Heart: regular rate and rhythm, S1, S2 normal, no murmur, click, rub or gallop Abdomen: soft, non-tender; bowel sounds normal; no masses,  no organomegaly Extremities: extremities normal, atraumatic, no cyanosis or edema Pulses: 2+ and symmetric Skin: Skin color, texture, turgor normal. No rashes or lesions Neurologic: Alert and oriented X 3, normal strength and tone. Normal symmetric reflexes. Normal coordination and gait  Lab Results:  Recent Labs  12/19/15 0425  WBC 7.0  HGB 11.4*  PLT 275    Recent Labs  12/17/15 0522 12/19/15 0425  NA 138 139  K 3.5 3.1*  CL 86* 87*  CO2 33* 37*  GLUCOSE 192* 245*  BUN 111* 112*  CREATININE 8.45* 8.68*   No results for input(s): TROPONINI in the last 72 hours.  Invalid input(s): CK, MB Hepatic Function Panel No results for input(s): PROT, ALBUMIN, AST, ALT, ALKPHOS, BILITOT, BILIDIR, IBILI in the last 72 hours. No results for input(s): CHOL in the last 72 hours. No results for input(s): PROTIME in the  last 72 hours.  Imaging: Imaging results have been reviewed  Cardiac Studies:  Assessment/Plan:  respiratory failure  Acute myocardial infarction non-STEMI Hypertension Hyperlipidemia Diabetes type 2 Mild obesity Acute renal failure Pneumonia . Plan Continue antibiotics therapy Agree with renal insufficiency follow-up with nephrology Consider dialysis therapy Agree with Crestor therapy Continue insulin therapy for diabetes DVT prophylaxis Preoperative cardiac cath on Monday with Dr. Saralyn Pilar    LOS: 8 days    Tre Sanker D. 12/19/2015, 8:13 AM

## 2015-12-19 NOTE — Progress Notes (Signed)
PT Cancellation Note  Patient Details Name: Todd [name] MRN: [id number] DOB: 10-04-1940   Cancelled Treatment:    Reason Eval/Treat Not Completed: Medical issues which prohibited therapy;Patient at procedure or test/unavailable.  Pt in HD and will check tomorrow.   Ramond Dial 12/19/2015, 12:50 PM    Mee Hives, PT MS Acute Rehab Dept. Number: [place] [facility] [place]

## 2015-12-19 NOTE — Care Management Important Message (Signed)
Important Message  Patient Details  Name: Todd [name] MRN: [id number] Date of Birth: [birth year]   Medicare Important Message Given:  Yes    Chealsey Miyamoto A, RN 12/19/2015, 4:08 PM

## 2015-12-19 NOTE — Progress Notes (Signed)
Subjective:  Patient states to feel reasonably well denies any chest pain no fever much improved since admission status post dialysis yesterday  Objective:  Vital Signs in the last 24 hours: Temp:  [97.5 F (36.4 C)-98.4 F (36.9 C)] 97.5 F (36.4 C) (10/01 0444) Pulse Rate:  [73-87] 87 (10/01 0444) Resp:  [16-18] 18 (10/01 0444) BP: (128-142)/(70-74) 142/71 (10/01 0444) SpO2:  [89 %-95 %] 95 % (10/01 0444) Weight:  [78.7 kg (173 lb 8 oz)] 78.7 kg (173 lb 8 oz) (10/01 0444)  Intake/Output from previous day: 09/30 0701 - 10/01 0700 In: 360 [P.O.:360] Out: 1031 [Urine:650; Stool:1] Intake/Output from this shift: Total I/O In: -  Out: 200 [Urine:200]  Physical Exam: General appearance: appears stated age Neck: no adenopathy, no carotid bruit, no JVD, supple, symmetrical, trachea midline and thyroid not enlarged, symmetric, no tenderness/mass/nodules Lungs: clear to auscultation bilaterally Heart: regular rate and rhythm, S1, S2 normal, no murmur, click, rub or gallop Abdomen: soft, non-tender; bowel sounds normal; no masses,  no organomegaly Extremities: extremities normal, atraumatic, no cyanosis or edema Pulses: 2+ and symmetric Skin: Skin color, texture, turgor normal. No rashes or lesions Neurologic: Alert and oriented X 3, normal strength and tone. Normal symmetric reflexes. Normal coordination and gait  Lab Results:  Recent Labs  12/19/15 0425  WBC 7.0  HGB 11.4*  PLT 275    Recent Labs  12/17/15 0522 12/19/15 0425  NA 138 139  K 3.5 3.1*  CL 86* 87*  CO2 33* 37*  GLUCOSE 192* 245*  BUN 111* 112*  CREATININE 8.45* 8.68*   No results for input(s): TROPONINI in the last 72 hours.  Invalid input(s): CK, MB Hepatic Function Panel No results for input(s): PROT, ALBUMIN, AST, ALT, ALKPHOS, BILITOT, BILIDIR, IBILI in the last 72 hours. No results for input(s): CHOL in the last 72 hours. No results for input(s): PROTIME in the last 72  hours.  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 Plan Agree with telemetry Continue antibiotic therapy intravenously Continue insulin therapy for diabetes management Agree with dialysis therapy Continue anticoagulation DVT prophylaxis Recommend cardiac catheter on Monday Supplemental oxygen for hypoxemia Consider inhaler therapy for shortness of breath pneumonia Continue blood pressure control with Norvasc Coreg Cardiac catheter tomorrow with Dr. Saralyn Pilar    LOS: 8 days    Todd [name] D. 12/19/2015, 8:02 AM

## 2015-12-19 NOTE — Progress Notes (Signed)
Orthos ordered only was able to get sitting and laying pt is not able to stand at all. Enliven ordered BID but he wants magic cup Magic cup given

## 2015-12-19 NOTE — Progress Notes (Signed)
[place] [facility] [place] NAME: Todd [name]    MR#:  [id number]  DATE OF BIRTH:  October 25, 1940  SUBJECTIVE:  CHIEF COMPLAINT:   Chief Complaint  Patient presents with  . Shortness of Breath   - feels Satisfactory today, some nausea  - Cr worsening, urine output is still good, About 850 cc over the past 24 hours. The patient was initiated on peritoneal dialysis last night, tolerated it well, however, did not sleep due to the machine beeping - weaned off high flow nasal cannula, remains on 2-3L now. Denies any shortness of breath, lower extremity swelling. Cardiology is planning cardiac catheterization on Monday. No pain or discomfort. Some nausea  REVIEW OF SYSTEMS:  Review of Systems  Constitutional: Negative for chills, fever and malaise/fatigue.  HENT: Negative for ear discharge and ear pain.   Eyes: Negative for blurred vision and double vision.  Respiratory: Positive for cough, hemoptysis and shortness of breath. Negative for wheezing.   Cardiovascular: Negative for chest pain, palpitations and leg swelling.  Gastrointestinal: Positive for nausea and vomiting. Negative for abdominal pain, constipation and diarrhea.  Genitourinary: Negative for dysuria and urgency.  Musculoskeletal: Negative for myalgias.  Neurological: Negative for dizziness, sensory change, speech change, focal weakness, seizures and headaches.  Psychiatric/Behavioral: Negative for depression.    DRUG ALLERGIES:   Allergies  Allergen Reactions  . Centrum Other (See Comments)    Bloating, indigestion, chest pain  . Valsartan Other (See Comments)    Unsure of symptoms, was on list from previous hospital and patient was unsure of name of medicine.  . Vitamin D Analogs Other (See Comments)    Bloating and swelling in abdomen region, indigestion. Prescription vit d not otc    VITALS:  Blood pressure (!) 142/71, pulse 87, temperature 97.5 F (36.4 C), temperature  source Oral, resp. rate 18, height 5\' 9"  (1.753 m), weight 78.7 kg (173 lb 8 oz), SpO2 95 %.  PHYSICAL EXAMINATION:  Physical Exam  GENERAL:  75 y.o.-year-old patient lying in the bed with no acute distress.  not tachypneic EYES: Pupils equal, round, reactive to light and accommodation. No scleral icterus. Extraocular muscles intact.  HEENT: Head atraumatic, normocephalic. Oropharynx and nasopharynx clear.  NECK:  Supple, no jugular venous distention. No thyroid enlargement, no tenderness.  LUNGS: Better air entrance bilaterally , no wheezing, fine crackles at the bases, no rhonchi or crepitation. No use of accessory muscles of respiration. Decreased bibasilar breath sounds noted CARDIOVASCULAR: S1, S2 normal. No murmurs, rubs, or gallops.  ABDOMEN: Soft, nontender, nondistended. Bowel sounds present. No organomegaly or mass. Peritoneal dialysis catheter present EXTREMITIES: No pedal edema, cyanosis, or clubbing.  NEUROLOGIC: Cranial nerves II through XII are intact. Muscle strength 5/5 in all extremities. Sensation intact. Gait not checked.  PSYCHIATRIC: The patient is alert and oriented x 3.  SKIN: No obvious rash, lesion, or ulcer.    LABORATORY PANEL:   CBC  Recent Labs Lab 12/19/15 0425  WBC 7.0  HGB 11.4*  HCT 33.2*  PLT 275   ------------------------------------------------------------------------------------------------------------------  Chemistries   Recent Labs Lab 12/16/15 0603  12/19/15 0425  NA 139  < > 139  K 3.6  < > 3.1*  CL 88*  < > 87*  CO2 34*  < > 37*  GLUCOSE 175*  < > 245*  BUN 105*  < > 112*  CREATININE 7.62*  < > 8.68*  CALCIUM 7.8*  < > 7.9*  MG 2.0  --   --   < > =  values in this interval not displayed. ------------------------------------------------------------------------------------------------------------------  Cardiac Enzymes  Recent Labs Lab 12/13/15 2253  TROPONINI 12.82*    ------------------------------------------------------------------------------------------------------------------  RADIOLOGY:  No results found.  EKG:   Orders placed or performed during the hospital encounter of 12/10/15  . ED EKG within 10 minutes  . ED EKG within 10 minutes  . EKG 12-Lead  . 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  #1 acute hypoxic respiratory failure-secondary to pneumonia and pulmonary edema -Off of high flow nasal cannula. Currently on 3 L nasal cannula. - encouraged incentive spirometry -Blood cultures are not done on admission. Received cefepime x 7 days -CT chest showed bilateral pneumonia, but improved aeration, consolidation in left lung, now on levofloxacin every 48 hours. 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 -As patient is started on dialysis, cardiac catheterization will be done on Monday by DR Paraschos - continue coreg and statin  #3 CKD stage V-progressed to end-stage renal disease. Has peritoneal dialysis catheter. Started peritoneal dialysis , stable clinically with not mature PD catheter-Nephrology input is appreciated. Of diuretics  #4 DM- on SSI and low dose lantus, novolog tid started. Blood glucose is ranging between 200-300. Advance insulin Lantus to 15 units subcutaneously at bedtime today,  advance NovoLog.   #5 hypertension-on Norvasc and Coreg, relatively good control  #6 DVT Prophylaxis- SQ heparin  #7. Syncope due to orthostatic hypotension , the  patient  received a bolus of IV fluids, follow blood pressure readings, orthostatic vital signs were not done  #8. Generalized weakness, Physical therapy consult is ordered but pending  All the records are reviewed and case discussed with Care Management/Social Workerr. Management plans discussed with the patient, family and they are in agreement.  CODE STATUS: Full code  TOTAL TIME SPENT IN TAKING CARE OF THIS PATIENT: 40 minutes.  Discussed with Dr. Holley Raring  POSSIBLE D/C IN 2-3 DAYS, DEPENDING ON CLINICAL CONDITION.   Theodoro Grist M.D on 12/19/2015 at 11:15 AM  Between 7am to 6pm - Pager - [phone]  After 6pm go to [email or web address] - password [email or web address] [facility]  Office  [phone]  CC: Primary care physician; Leonel Ramsay, MD

## 2015-12-19 NOTE — Progress Notes (Signed)
CBG taken at 2200 was 315, glucometer machine did not transfer reading to Epic.

## 2015-12-19 NOTE — Progress Notes (Signed)
Pt awake and talking Breakfast was ordered last night so it will come shortly. He has no complaints of anything.

## 2015-12-19 NOTE — Progress Notes (Signed)
Initial Nutrition Assessment  DOCUMENTATION CODES:   Not applicable  INTERVENTION:  -Ensure Enlive po BID, each supplement provides 350 kcal and 20 grams of protein -Magic cup TID with meals, each supplement provides 290 kcal and 9 grams of protein  NUTRITION DIAGNOSIS:   Increased nutrient needs related to other (see comment) (Dialysis) as evidenced by estimated needs.  GOAL:   Patient will meet greater than or equal to 90% of their needs  MONITOR:   PO intake, Supplement acceptance, I & O's, Labs, Weight trends, Skin  REASON FOR ASSESSMENT:   Consult Assessment of nutrition requirement/status  ASSESSMENT:   Todd [name]  is a 75 y.o. male who presents with Shortness of breath and episode of hypoxia. Patient went for catheter placement today in anticipation of starting dialysis sometime soon. Post catheter placement he had some altered mental status and was found to be hypoxic.  Spoke with Pt, wife at bedside. She endorses pt wt loss of 25# since admission, related to fluid via PD. He has eaten poorly since he has been here, was doing ok PTA. He mostly eats jello right now, just doesn't have much of an appetite, however he did receive magic cup and ensure yesterday that he liked. Will continue. We will have to monitor his Phos closely with this, he was receiving PD during my visit, if it begins to sit too high we will have to switch to NePro. Nutrition-Focused physical exam completed. Findings are moderate fat depletion, no muscle depletion, and no edema.  Labs and medications reviewed: K 3.1, Phos 5.9  Diet Order:  Diet renal/carb modified with fluid restriction Diet-HS Snack? Nothing; Room service appropriate? Yes; Fluid consistency: Thin  Skin:  Reviewed, no issues  Last BM:  9/30  Height:   Ht Readings from Last 1 Encounters:  12/10/15 5\' 9"  (1.753 m)    Weight:   Wt Readings from Last 1 Encounters:  12/19/15 173 lb 8 oz (78.7 kg)    Ideal Body Weight:   72.72 kg  BMI:  Body mass index is 25.62 kg/m.  Estimated Nutritional Needs:   Kcal:  2000-2400 calories  Protein:  80-95 gm  Fluid:  >/= 1.6L  EDUCATION NEEDS:   No education needs identified at this time  Satira Anis. Clorissa Gruenberg, MS, RD LDN Inpatient Clinical Dietitian Pager [phone]

## 2015-12-19 NOTE — Progress Notes (Signed)
Pt rested throughout the night. No complaints of pain,sob,no acute distress noted.  Bud Face Rn

## 2015-12-20 ENCOUNTER — Encounter
Admission: EM | Disposition: A | Discharge status: Other Acute Inpatient Hospital | Payer: Self-pay | Source: Home / Self Care | Attending: Internal Medicine

## 2015-12-20 ENCOUNTER — Inpatient Hospital Stay: Payer: Medicare Other

## 2015-12-20 ENCOUNTER — Encounter: Payer: Self-pay | Admitting: Cardiology

## 2015-12-20 HISTORY — PX: CARDIAC CATHETERIZATION: SHX172

## 2015-12-20 LAB — GLUCOSE, CAPILLARY
GLUCOSE-CAPILLARY: 252 mg/dL — AB (ref 65–99)
GLUCOSE-CAPILLARY: 224 mg/dL — AB (ref 65–99)
GLUCOSE-CAPILLARY: 247 mg/dL — AB (ref 65–99)
Glucose-Capillary: 226 mg/dL — ABNORMAL HIGH (ref 65–99)

## 2015-12-20 SURGERY — LEFT HEART CATH AND CORONARY ANGIOGRAPHY
Anesthesia: Moderate Sedation | Laterality: Right

## 2015-12-20 MED ORDER — INSULIN GLARGINE 100 UNIT/ML ~~LOC~~ SOLN
20.0000 [IU] | Freq: Every day | SUBCUTANEOUS | Status: DC
  Administered 2015-12-20 – 2015-12-24 (×5): 20 [IU] via SUBCUTANEOUS
  Filled 2015-12-20 (×5): qty 0.2

## 2015-12-20 MED ORDER — SODIUM CHLORIDE 0.9% FLUSH: 3.0000 mL | INTRAVENOUS | Status: DC | PRN

## 2015-12-20 MED ORDER — SODIUM CHLORIDE 0.9 % IV SOLN
INTRAVENOUS | Status: AC
  Administered 2015-12-20 – 2015-12-22 (×3): via INTRAVENOUS

## 2015-12-20 MED ORDER — MIDAZOLAM HCL 2 MG/2ML IJ SOLN
INTRAMUSCULAR | Status: DC | PRN
  Administered 2015-12-20: 0.5 mg via INTRAVENOUS

## 2015-12-20 MED ORDER — SODIUM CHLORIDE 0.9 % IV SOLN: 250.0000 mL | INTRAVENOUS | Status: DC | PRN

## 2015-12-20 MED ORDER — SODIUM CHLORIDE 0.9 % IV SOLN: INTRAVENOUS | Status: DC

## 2015-12-20 MED ORDER — HEPARIN (PORCINE) IN NACL 2-0.9 UNIT/ML-% IJ SOLN
INTRAMUSCULAR | Status: AC
  Filled 2015-12-20: qty 500

## 2015-12-20 MED ORDER — FENTANYL CITRATE (PF) 100 MCG/2ML IJ SOLN
INTRAMUSCULAR | Status: AC
  Filled 2015-12-20: qty 2

## 2015-12-20 MED ORDER — FENTANYL CITRATE (PF) 100 MCG/2ML IJ SOLN
INTRAMUSCULAR | Status: DC | PRN
  Administered 2015-12-20: 25 ug via INTRAVENOUS

## 2015-12-20 MED ORDER — SODIUM CHLORIDE 0.9% FLUSH
3.0000 mL | Freq: Two times a day (BID) | INTRAVENOUS | Status: DC
  Administered 2015-12-20: 3 mL via INTRAVENOUS

## 2015-12-20 MED ORDER — ASPIRIN 81 MG PO CHEW: 81.0000 mg | CHEWABLE_TABLET | ORAL | Status: DC

## 2015-12-20 MED ORDER — MIDAZOLAM HCL 2 MG/2ML IJ SOLN
INTRAMUSCULAR | Status: AC
  Filled 2015-12-20: qty 2

## 2015-12-20 MED ORDER — ASPIRIN 81 MG PO CHEW
CHEWABLE_TABLET | ORAL | Status: AC
  Administered 2015-12-20: 81 mg
  Filled 2015-12-20: qty 1

## 2015-12-20 SURGICAL SUPPLY — 8 items
CATH 5FR JL4 DIAGNOSTIC (CATHETERS) ×3 IMPLANT
CATH 5FR JR4 DIAGNOSTIC (CATHETERS) ×2 IMPLANT
DEVICE CLOSURE MYNXGRIP 5F (Vascular Products) ×3 IMPLANT
KIT MANI 3VAL PERCEP (MISCELLANEOUS) ×3 IMPLANT
NEEDLE PERC 18GX7CM (NEEDLE) ×3 IMPLANT
PACK CARDIAC CATH (CUSTOM PROCEDURE TRAY) ×3 IMPLANT
SHEATH AVANTI 5FR X 11CM (SHEATH) ×3 IMPLANT
WIRE EMERALD 3MM-J .035X150CM (WIRE) ×3 IMPLANT

## 2015-12-20 NOTE — NC FL2 (Signed)
[place] LEVEL OF CARE SCREENING TOOL     IDENTIFICATION  Patient Name: Todd [name] Birthdate: April 06, 1940 Sex: male Admission Date (Current Location): 12/10/2015  [place] and [place] Number:  Engineering geologist and Address:  [facility], [address] [place], [place] [address]      Provider Number: [id number]  Attending Physician Name and Address:  Theodoro Grist, MD  Relative Name and Phone Number:       Current Level of Care: Hospital Recommended Level of Care: [place] Prior Approval Number:    Date Approved/Denied:   PASRR Number:    Discharge Plan: SNF    Current Diagnoses: Patient Active Problem List   Diagnosis Date Noted  . Central line insertion site infection   . Hypoxia   . Respiratory failure with hypoxia ([place])   . Pneumonia 12/11/2015  . ESRD (end stage renal disease) ([place]) 12/11/2015  . CAD (coronary artery disease) 12/11/2015  . Essential hypertension 04/29/2015  . Type 2 diabetes mellitus with neurologic complication ([place]) [id number]  . HLD (hyperlipidemia) 04/29/2015  . Thalamic hemorrhage ([place])   . ICH (intracerebral hemorrhage) ([place]) 02/01/2015  . Abdominal pain 01/25/2015  . Acute hepatitis   . Enteritis   . Ileus ([place])   . Uncontrollable vomiting     Orientation RESPIRATION BLADDER Height & Weight     Self, Time, Situation, Place  O2 (3 Liters Oxygen ) Continent Weight: 178 lb (80.7 kg) Height:  5\' 9"  (175.3 cm)  BEHAVIORAL SYMPTOMS/MOOD NEUROLOGICAL BOWEL NUTRITION STATUS   (none )  (none) Continent Diet (Diet: Renal/ Carb Modified )  AMBULATORY STATUS COMMUNICATION OF NEEDS Skin   Extensive Assist Verbally Surgical wounds (12/11/15: Incision Left Abdomen )                       Personal Care Assistance Level of Assistance  Bathing, Feeding, Dressing Bathing Assistance: Limited assistance Feeding assistance: Independent Dressing Assistance: Limited assistance      Functional Limitations Info  Sight, Hearing, Speech Sight Info: Adequate Hearing Info: Adequate Speech Info: Adequate    SPECIAL CARE FACTORS FREQUENCY  PT (By licensed PT), OT (By licensed OT)     PT Frequency:  (5) OT Frequency:  (5)            Contractures      Additional Factors Info  Code Status, Allergies, Insulin Sliding Scale Code Status Info:  (Full Code. ) Allergies Info:  (Centrum, Valsartan, Vitamin D Analogs)           Current Medications (12/20/2015):  This is the current hospital active medication list Current Facility-Administered Medications  Medication Dose Route Frequency Provider Last Rate Last Dose  . 0.9 %  sodium chloride infusion   Intravenous Continuous Lavonia Dana, MD 100 mL/hr at 12/20/15 1513    . acetaminophen (TYLENOL) tablet 650 mg  650 mg Oral Q6H PRN Lance Coon, MD       Or  . acetaminophen (TYLENOL) suppository 650 mg  650 mg Rectal Q6H PRN Lance Coon, MD      . aspirin EC tablet 81 mg  81 mg Oral Daily Lance Coon, MD   81 mg at 12/19/15 1007  . bisacodyl (DULCOLAX) EC tablet 10 mg  10 mg Oral Daily PRN Gladstone Lighter, MD   10 mg at 12/16/15 1230  . dialysis solution 2.5% low-MG/low-CA dianeal solution   Intraperitoneal Q24H Munsoor Lateef, MD   6 L at 12/19/15  1117  . feeding supplement (ENSURE ENLIVE) (ENSURE ENLIVE) liquid 237 mL  237 mL Oral BID BM Theodoro Grist, MD   237 mL at 12/20/15 1331  . gentamicin cream (GARAMYCIN) 0.1 % 1 application  1 application Topical Daily Munsoor Lateef, MD   1 application at 0000000 1117  . heparin injection 5,000 Units  5,000 Units Subcutaneous Q8H Laverle Hobby, MD   5,000 Units at 12/20/15 1320  . insulin aspart (novoLOG) injection 0-5 Units  0-5 Units Subcutaneous QHS Lance Coon, MD   5 Units at 12/19/15 2148  . insulin aspart (novoLOG) injection 0-9 Units  0-9 Units Subcutaneous TID WC Lance Coon, MD   3 Units at 12/20/15 1149  . insulin aspart (novoLOG) injection 6 Units   6 Units Subcutaneous TID WC Theodoro Grist, MD   6 Units at 12/20/15 1149  . insulin glargine (LANTUS) injection 20 Units  20 Units Subcutaneous QHS Theodoro Grist, MD      . levofloxacin (LEVAQUIN) tablet 500 mg  500 mg Oral Q48H Merilyn Baba, RPH   500 mg at 12/19/15 1007  . LORazepam (ATIVAN) injection 0.5 mg  0.5 mg Intravenous Q6H PRN Lance Coon, MD   0.5 mg at 12/18/15 0227  . MEDLINE mouth rinse  15 mL Mouth Rinse BID Gladstone Lighter, MD   15 mL at 12/19/15 2147  . ondansetron (ZOFRAN) tablet 4 mg  4 mg Oral Q6H PRN Lance Coon, MD       Or  . ondansetron [facility]) injection 4 mg  4 mg Intravenous Q6H PRN Lance Coon, MD   4 mg at 12/17/15 1756  . oxyCODONE (Oxy IR/ROXICODONE) immediate release tablet 5 mg  5 mg Oral Q4H PRN Lance Coon, MD      . oxyCODONE-acetaminophen (PERCOCET/ROXICET) 5-325 MG per tablet 1-2 tablet  1-2 tablet Oral Q4H PRN Lance Coon, MD      . polyethylene glycol (MIRALAX / GLYCOLAX) packet 17 g  17 g Oral Daily Gladstone Lighter, MD   17 g at 12/19/15 1007  . promethazine (PHENERGAN) injection 12.5 mg  12.5 mg Intravenous Q6H PRN Lance Coon, MD   12.5 mg at 12/14/15 2228  . rosuvastatin (CRESTOR) tablet 20 mg  20 mg Oral QHS Lance Coon, MD   20 mg at 12/19/15 2145  . senna-docusate (Senokot-S) tablet 2 tablet  2 tablet Oral BID Gladstone Lighter, MD   2 tablet at 12/19/15 2146  . sodium bicarbonate tablet 1,950 mg  1,950 mg Oral TID Lance Coon, MD   1,950 mg at 12/20/15 1551  . sodium chloride (OCEAN) 0.65 % nasal spray 1 spray  1 spray Each Nare PRN Gladstone Lighter, MD   1 spray at 12/12/15 1457  . sodium chloride flush (NS) 0.9 % injection 10-40 mL  10-40 mL Intracatheter Q12H Gladstone Lighter, MD   30 mL at 12/19/15 2150  . sodium chloride flush (NS) 0.9 % injection 10-40 mL  10-40 mL Intracatheter PRN Gladstone Lighter, MD         Discharge Medications: Please see discharge summary for a list of discharge medications.  Relevant Imaging  Results:  Relevant Lab Results:   Additional Information  (SSN: [id number] (New Dialysis) )  Surie Suchocki, Veronia Beets, LCSW

## 2015-12-20 NOTE — Progress Notes (Signed)
Report received from [facility]. Patient arrived back to room from specials. VSS. No distress at this time. Minx closure device on right groin clean,dry, and intact. Bilateral pedal pulses +1. Telemetry on. Will continue to monitor. No complaints at this time.

## 2015-12-20 NOTE — Progress Notes (Signed)
PT Cancellation Note  Patient Details Name: Todd [name] MRN: [id number] DOB: 1940-05-24   Cancelled Treatment:    Reason Eval/Treat Not Completed: Patient at procedure or test/unavailable (Consult received and chart reviewed.  Patient currently off unit for procedure; will re-attempt at later time/date as medically appropriate.)    Mahsa Hanser H. Owens Shark, PT, DPT, NCS 12/20/15, 9:29 AM [phone]

## 2015-12-20 NOTE — Evaluation (Signed)
Physical Therapy Evaluation Patient Details Name: Todd [name] MRN: [id number] DOB: 03/08/1941 Today's Date: 12/20/2015   History of Present Illness  Pt. presented to ED with SOB, admitted with pneumonia B, transferred to CCU 9/23-9/27 for continued breathing difficulties. additionally experienced acute MI, on peritoneal dialysis. hx. stroke in nov. 2016   Clinical Impression  Pt. Supine in bed upon arrival, awake, alert, oriented and agreeable to PT Pt. Demonstrates good strength grossly 5/5 B UE/LE, some diminished sensation to R UE and R LE pt. States this is residual from previous stroke in Nov. 2016. Orthostatics assessed throughout session; supine Bp142/63, sitting EOB 109/53 pt. Asymtomatic, sitting EOB following exercises 98/61 pt. asymtomatic, sitting EOB 78/57 pt. Became symptomatic, spO2 89% on room air. Pt. Returned to supine BP 129/64 spO2 94%. Pt. Was able to tolerate some UE and LE exercises while sitting EOB however, activity is limited primarily by BP/spO2 instability, pt. Subjectively states "sometimes I forget to breath" Would benefit from skilled PT to address above deficits and promote optimal return to PLOF Recommend SNF placement upon d/c to follow up with further skilled PT needs. Will continue to progress mobility and update recommendations as necessary.     Follow Up Recommendations SNF    Equipment Recommendations       Recommendations for Other Services       Precautions / Restrictions Precautions Precautions: Fall;Other (comment) (Pt. has peritoneal tube in abdomen for dialysis) Restrictions Weight Bearing Restrictions: No      Mobility  Bed Mobility Overal bed mobility: Needs Assistance Bed Mobility: Supine to Sit;Sit to Supine     Supine to sit: Min guard     General bed mobility comments: Pt. demonstrates ability to initiate B LE and trunk movements, able to position EOB with assist of bedrail. required assist for boosting in bed upon return to  supine, pt. also had decline in BP/spO2 stability at this time  Transfers Overall transfer level:  (unable to assess today due to orthostasis)                  Ambulation/Gait Ambulation/Gait assistance:  (unable to assess today due to orthostasis and mobility order restrictions)              Stairs            Wheelchair Mobility    Modified Rankin (Stroke Patients Only)       Balance Overall balance assessment: Needs assistance Sitting-balance support: Feet supported Sitting balance-Leahy Scale: Good Sitting balance - Comments: Pt. demonstrates good sitting balance EOB, able to perform multiple LE/UE dynamic movements while maintaining stability    Standing balance support:  (unable to assess due to orthostasis)                                 Pertinent Vitals/Pain Pain Assessment: No/denies pain    Home Living Family/patient expects to be discharged to:: Private residence Living Arrangements: Spouse/significant other Available Help at Discharge: Family Type of Home: House Home Access: Stairs to enter Entrance Stairs-Rails: Can reach both Entrance Stairs-Number of Steps: 3 Home Layout: Able to live on main level with bedroom/bathroom;Two level Home Equipment: Cane - single point      Prior Function Level of Independence: Independent         Comments: pt. reports he owns [facility] but "it's parked in the car where it belongs", attends cardiac rehab x3/week, previously independent with all  ADL's      Hand Dominance        Extremity/Trunk Assessment   Upper Extremity Assessment: Overall WFL for tasks assessed (Pt. demonstrates grossly 5/5 symmetrical B UE strength.Marland KitchenMarland KitchenReports sensation to light touch intact B but diminished R<L due to previous stroke)           Lower Extremity Assessment:  (B LE strength grossly 5/5 symmetrical, pt. sensation intact B LE, however diminished R<L due to previous stroke)         Communication    Communication: No difficulties  Cognition Arousal/Alertness: Awake/alert Behavior During Therapy: WFL for tasks assessed/performed Overall Cognitive Status: Within Functional Limits for tasks assessed                      General Comments      Exercises Other Exercises Other Exercises: sitting EOB exercises x10 B UE PNF movements, x10B resisted shoulder abduction, BLE marching x10, LAQ BLE x10   Assessment/Plan    PT Assessment Patient needs continued PT services  PT Problem List Decreased strength;Decreased range of motion;Decreased balance;Decreased mobility;Decreased activity tolerance;Decreased knowledge of use of DME          PT Treatment Interventions DME instruction;Functional mobility training;Therapeutic activities;Gait training;Stair training;Therapeutic exercise;Balance training;Patient/family education    PT Goals (Current goals can be found in the Care Plan section)  Acute Rehab PT Goals Patient Stated Goal: pt. would like to return to independence at home PT Goal Formulation: With patient Time For Goal Achievement: 01/03/16 Potential to Achieve Goals: Fair    Frequency Min 2X/week   Barriers to discharge Inaccessible home environment      Co-evaluation               End of Session Equipment Utilized During Treatment: Oxygen Activity Tolerance: Treatment limited secondary to medical complications (Comment) (Pt. limited by orthostasis upon position change, unable to progress past sitting EOB) Patient left: in bed;with call bell/phone within reach;with bed alarm set           Time: 1355-1428 PT Time Calculation (min) (ACUTE ONLY): 33 min   Charges:   PT Evaluation $PT Eval Moderate Complexity: 1 Procedure PT Treatments $Therapeutic Exercise: 8-22 mins   PT G Codes:        Zymarion Favorite, SPT  12/20/15,3:11 PM

## 2015-12-20 NOTE — Clinical Social Work Note (Signed)
Clinical Social Work Assessment  Patient Details  Name: Todd [name] MRN: [id number] Date of Birth: 10/03/1940  Date of referral:  12/20/15               Reason for consult:  Facility Placement                Permission sought to share information with:  Chartered certified accountant granted to share information::  Yes, Verbal Permission Granted  Name::      [place]::   [place]   Relationship::     Contact Information:     Housing/Transportation Living arrangements for the past 2 months:  [place] of Information:  Patient, Spouse Patient Interpreter Needed:  None Criminal Activity/Legal Involvement Pertinent to Current Situation/Hospitalization:  No - Comment as needed Significant Relationships:  Spouse Lives with:  Spouse Do you feel safe going back to the place where you live?  Yes Need for family participation in patient care:  Yes (Comment)  Care giving concerns:  Patient lives in [place] with his wife Todd [name].    Social Worker assessment / plan:  Holiday representative (CSW) received verbal consult from RN case manager that PT is recommending SNF and patient will be a new hemodialysis. CSW met with patient and his wife Todd [name] at bedside to discuss D/C plan. Per wife the goal is to get patient home and do peritoneal dialysis however she can't take care of patient at home in the shape he is in now. CSW discussed SNF option and explained how dialysis would work at a SNF. Patient and wife are agreeable to SNF search and prefer [employer] FL2 complete and faxed out. CSW will continue to follow and assist as needed.      Employment status:  Retired Nurse, adult PT Recommendations:  [place] / Referral to community resources:  [place]  Patient/Family's Response to care: Patient and wife are agreeable to [employer].   Patient/Family's  Understanding of and Emotional Response to Diagnosis, Current Treatment, and Prognosis:  Patient and wife were pleasant and thanked CSW for visit.   Emotional Assessment Appearance:  Appears stated age Attitude/Demeanor/Rapport:    Affect (typically observed):  Accepting, Adaptable, Pleasant Orientation:  Oriented to Self, Oriented to  Time, Oriented to Place, Oriented to Situation Alcohol / Substance use:  Not Applicable Psych involvement (Current and /or in the community):  No (Comment)  Discharge Needs  Concerns to be addressed:  Discharge Planning Concerns Readmission within the last 30 days:  No Current discharge risk:  Dependent with Mobility Barriers to Discharge:  Continued Medical Work up   [employer], Veronia Beets, LCSW 12/20/2015, 4:12 PM

## 2015-12-20 NOTE — Progress Notes (Signed)
Report to [facility] on telemetry.  Check right groin for bleeding or hematoma.  Patient will be on bedrest for 1 hours post sheath pull---out of bed at 10:50.  Bilateral pulses are 2's PT's.Marland Kitchen

## 2015-12-20 NOTE — Progress Notes (Signed)
Subjective:   PD last night.  Cardiac catheterization this morning  Wife at bedside Patient worked with PT.   Objective:  Vital signs in last 24 hours:  Temp:  [97.6 F (36.4 C)-98.1 F (36.7 C)] 98 F (36.7 C) (10/02 1053) Pulse Rate:  [73-89] 75 (10/02 1053) Resp:  [14-20] 14 (10/02 1053) BP: (71-165)/(32-82) 133/68 (10/02 1053) SpO2:  [88 %-99 %] 89 % (10/02 1444) Weight:  [80.6 kg (177 lb 11.2 oz)-81.1 kg (178 lb 14.4 oz)] 80.7 kg (178 lb) (10/02 0434)  Weight change: 2.449 kg (5 lb 6.4 oz) Filed Weights   12/19/15 2145 12/20/15 0427 12/20/15 0434  Weight: 81.1 kg (178 lb 14.4 oz) 80.6 kg (177 lb 11.2 oz) 80.7 kg (178 lb)    Intake/Output:    Intake/Output Summary (Last 24 hours) at 12/20/15 1509 Last data filed at 12/20/15 0740  Gross per 24 hour  Intake               30 ml  Output             1316 ml  Net            -1286 ml     Physical Exam: General: No acute distress, sitting in the bed   HEENT Anicteric, moist oral mucous membranes  Neck Supple   Pulm/lungs CTAB normal effort   CVS/Heart S1S2 no rubs  Abdomen:  Soft, nontender, nondistended, PD catheter in place   Extremities: No lower extremity edema   Neurologic: Alert, oriented x 3 following commands  Skin: No acute rashes   Access: PD catheter       Basic Metabolic Panel:   Recent Labs Lab 12/14/15 0152 12/15/15 0547 12/16/15 0603 12/17/15 0522 12/19/15 0425  NA 140 141 139 138 139  K 3.9 3.6 3.6 3.5 3.1*  CL 97* 93* 88* 86* 87*  CO2 31 33* 34* 33* 37*  GLUCOSE 151* 192* 175* 192* 245*  BUN 87* 98* 105* 111* 112*  CREATININE 5.79* 6.83* 7.62* 8.45* 8.68*  CALCIUM 7.4* 7.8* 7.8* 7.9* 7.9*  MG 1.7 1.6* 2.0  --   --   PHOS  --  5.9*  --   --   --      CBC:  Recent Labs Lab 12/14/15 0152 12/15/15 0547 12/16/15 0603 12/19/15 0425  WBC 6.3 4.8 5.5 7.0  HGB 10.4* 10.6* 11.0* 11.4*  HCT 30.2* 29.4* 32.1* 33.2*  MCV 92.2 91.3 92.9 93.1  PLT 195 204 228 275       Microbiology:  Recent Results (from the past 720 hour(s))  MRSA PCR Screening     Status: None   Collection Time: 12/11/15 11:47 AM  Result Value Ref Range Status   MRSA by PCR NEGATIVE NEGATIVE Final    Comment:        The GeneXpert MRSA Assay (FDA approved for NASAL specimens only), is one component of a comprehensive MRSA colonization surveillance program. It is not intended to diagnose MRSA infection nor to guide or monitor treatment for MRSA infections.   Culture, expectorated sputum-assessment     Status: None   Collection Time: 12/13/15  4:00 PM  Result Value Ref Range Status   Specimen Description SPUTUM  Final   Special Requests NONE  Final   Sputum evaluation   Final    Sputum specimen not acceptable for testing.  Please recollect.   SPOKE TO Tia Masker 12/13/15 @ 1955  [place]    Report Status 12/13/2015 FINAL  Final    Coagulation Studies: No results for input(s): LABPROT, INR in the last 72 hours.  Urinalysis: No results for input(s): COLORURINE, LABSPEC, PHURINE, GLUCOSEU, HGBUR, BILIRUBINUR, KETONESUR, PROTEINUR, UROBILINOGEN, NITRITE, LEUKOCYTESUR in the last 72 hours.  Invalid input(s): APPERANCEUR    Imaging: Dg Chest Port 1 View  Result Date: 12/20/2015 CLINICAL DATA:  Hypertension.  Recent pneumonia EXAM: PORTABLE CHEST 1 VIEW COMPARISON:  December 14, 2015 FINDINGS: There is stable elevation right hemidiaphragm. There is mild atelectasis in the left base. There is no edema or consolidation. Heart size and pulmonary vascularity are normal. Central catheter tip is in the superior vena cava. No pneumothorax. There is atherosclerotic calcification in the aorta. There are healed rib fractures on the left. IMPRESSION: Mild atelectasis left base. Stable elevation right hemidiaphragm. No edema or consolidation. Aortic atherosclerosis. Electronically Signed   By: Lowella Grip III M.D.   On: 12/20/2015 07:03     Medications:   . sodium  chloride     . amLODipine  10 mg Oral Daily  . aspirin EC  81 mg Oral Daily  . carvedilol  6.25 mg Oral BID WC  . dialysis solution 2.5% low-MG/low-CA   Intraperitoneal Q24H  . feeding supplement (ENSURE ENLIVE)  237 mL Oral BID BM  . gentamicin cream  1 application Topical Daily  . heparin subcutaneous  5,000 Units Subcutaneous Q8H  . insulin aspart  0-5 Units Subcutaneous QHS  . insulin aspart  0-9 Units Subcutaneous TID WC  . insulin aspart  6 Units Subcutaneous TID WC  . insulin glargine  20 Units Subcutaneous QHS  . levofloxacin  500 mg Oral Q48H  . mouth rinse  15 mL Mouth Rinse BID  . polyethylene glycol  17 g Oral Daily  . rosuvastatin  20 mg Oral QHS  . senna-docusate  2 tablet Oral BID  . sodium bicarbonate  1,950 mg Oral TID  . sodium chloride flush  10-40 mL Intracatheter Q12H   acetaminophen **OR** acetaminophen, bisacodyl, LORazepam, ondansetron **OR** ondansetron (ZOFRAN) IV, oxyCODONE, oxyCODONE-acetaminophen, promethazine, sodium chloride, sodium chloride flush  Assessment/ Plan:  75 y.o. white male with medical problems of CKD st 5 followed by [place] [facility] nephrology, long standing DM, HTN,CAD , who was admitted to [facility] on 12/10/2015 for evaluation of shortness of breath.   1.  CKD stage V, progressed to ESRD: prior to admission was doing training for peritoneal dialysis at [place].  - hold dialysis tonight, patient appears hypovolumic.  - IV normal saline.  - If no improvement, will transition to hemodialysis.   2. Anemia of chronic kidney disease.  Hemoglobin 11.4.  - Continue to hold off on epo.  3. Secondary Hyperparathyroidism: with hyperphosphatemia: not currently on binders or vitamin D agents.   4. Hypertension: blood pressure orthostatic - amlodipine, carvedilol - hold for now  5. Coronary artery disease: status post cardiac catheterization.  - appreciate cardiology input.    LOS: [place], [place] 10/2/20173:09 PM

## 2015-12-20 NOTE — Clinical Social Work Placement (Signed)
   CLINICAL SOCIAL WORK PLACEMENT  NOTE  Date:  12/20/2015  Patient Details  Name: Todd [name] MRN: [id number] Date of Birth: Feb 06, 1941  Clinical Social Work is seeking post-discharge placement for this patient at the [place] level of care (*CSW will initial, date and re-position this form in  chart as items are completed):  Yes   Patient/family provided with [facility] Work Department's list of facilities offering this level of care within the geographic area requested by the patient (or if unable, by the patient's family).  Yes   Patient/family informed of their freedom to choose among providers that offer the needed level of care, that participate in Medicare, Medicaid or managed care program needed by the patient, have an available bed and are willing to accept the patient.  Yes   Patient/family informed of [facility]'s ownership interest in [facility] and [facility], as well as of the fact that they are under no obligation to receive care at these facilities.  PASRR submitted to EDS on  ( [facility] was down on 12/20/15 )     PASRR number received on       Existing PASRR number confirmed on       FL2 transmitted to all facilities in geographic area requested by pt/family on 12/20/15     FL2 transmitted to all facilities within larger geographic area on       Patient informed that his/her managed care company has contracts with or will negotiate with certain facilities, including the following:            Patient/family informed of bed offers received.  Patient chooses bed at       Physician recommends and patient chooses bed at      Patient to be transferred to   on  .  Patient to be transferred to facility by       Patient family notified on   of transfer.  Name of family member notified:        PHYSICIAN       Additional Comment:    _______________________________________________ Jaira Canady, Veronia Beets, LCSW 12/20/2015,  4:11 PM

## 2015-12-20 NOTE — Progress Notes (Signed)
[facility] NAME: Todd [name]    MR#:  [id number]  DATE OF BIRTH:  1940/04/03  SUBJECTIVE:  CHIEF COMPLAINT:   Chief Complaint  Patient presents with  . Shortness of Breath   - feels Satisfactory today, Still some nausea. Peritoneal dialysis catheter drainage is poor, nephrologist, is considering hemodialysis and temporary hemodialysis catheter placement later this week. 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 Medical management was suggested - weaned off high flow nasal cannula, remains on 2-3L now. Denies any shortness of breath, lower extremity swelling.   REVIEW OF SYSTEMS:  Review of Systems  Constitutional: Negative for chills, fever and malaise/fatigue.  HENT: Negative for ear discharge and ear pain.   Eyes: Negative for blurred vision and double vision.  Respiratory: Positive for cough, hemoptysis and shortness of breath. Negative for wheezing.   Cardiovascular: Negative for chest pain, palpitations and leg swelling.  Gastrointestinal: Positive for nausea and vomiting. Negative for abdominal pain, constipation and diarrhea.  Genitourinary: Negative for dysuria and urgency.  Musculoskeletal: Negative for myalgias.  Neurological: Negative for dizziness, sensory change, speech change, focal weakness, seizures and headaches.  Psychiatric/Behavioral: Negative for depression.    DRUG ALLERGIES:   Allergies  Allergen Reactions  . Centrum Other (See Comments)    Bloating, indigestion, chest pain  . Valsartan Other (See Comments)    Unsure of symptoms, was on list from previous hospital and patient was unsure of name of medicine.  . Vitamin D Analogs Other (See Comments)    Bloating and swelling in abdomen region, indigestion. Prescription vit d not otc    VITALS:  Blood pressure 133/68, pulse 75, temperature 98 F (36.7 C), temperature source Oral, resp.  rate 14, height 5\' 9"  (1.753 m), weight 80.7 kg (178 lb), SpO2 97 %.  PHYSICAL EXAMINATION:  Physical Exam  GENERAL:  75 y.o.-year-old patient lying in the bed with no acute distress.  not tachypneic EYES: Pupils equal, round, reactive to light and accommodation. No scleral icterus. Extraocular muscles intact.  HEENT: Head atraumatic, normocephalic. Oropharynx and nasopharynx clear.  NECK:  Supple, no jugular venous distention. No thyroid enlargement, no tenderness.  LUNGS: Better air entrance bilaterally , no wheezing, fine crackles at the bases, no rhonchi or crepitation. No use of accessory muscles of respiration. Decreased bibasilar breath sounds noted CARDIOVASCULAR: S1, S2 normal. No murmurs, rubs, or gallops.  ABDOMEN: Soft, nontender, nondistended. Bowel sounds present. No organomegaly or mass. Peritoneal dialysis catheter present EXTREMITIES: No pedal edema, cyanosis, or clubbing.  NEUROLOGIC: Cranial nerves II through XII are intact. Muscle strength 5/5 in all extremities. Sensation intact. Gait not checked.  PSYCHIATRIC: The patient is alert and oriented x 3.  SKIN: No obvious rash, lesion, or ulcer.    LABORATORY PANEL:   CBC  Recent Labs Lab 12/19/15 0425  WBC 7.0  HGB 11.4*  HCT 33.2*  PLT 275   ------------------------------------------------------------------------------------------------------------------  Chemistries   Recent Labs Lab 12/16/15 0603  12/19/15 0425  NA 139  < > 139  K 3.6  < > 3.1*  CL 88*  < > 87*  CO2 34*  < > 37*  GLUCOSE 175*  < > 245*  BUN 105*  < > 112*  CREATININE 7.62*  < > 8.68*  CALCIUM 7.8*  < > 7.9*  MG 2.0  --   --   < > = values in this interval not displayed. ------------------------------------------------------------------------------------------------------------------  Cardiac  Enzymes  Recent Labs Lab 12/13/15 2253  TROPONINI 12.82*    ------------------------------------------------------------------------------------------------------------------  RADIOLOGY:  Dg Chest Port 1 View  Result Date: 12/20/2015 CLINICAL DATA:  Hypertension.  Recent pneumonia EXAM: PORTABLE CHEST 1 VIEW COMPARISON:  December 14, 2015 FINDINGS: There is stable elevation right hemidiaphragm. There is mild atelectasis in the left base. There is no edema or consolidation. Heart size and pulmonary vascularity are normal. Central catheter tip is in the superior vena cava. No pneumothorax. There is atherosclerotic calcification in the aorta. There are healed rib fractures on the left. IMPRESSION: Mild atelectasis left base. Stable elevation right hemidiaphragm. No edema or consolidation. Aortic atherosclerosis. Electronically Signed   By: Lowella Grip III M.D.   On: 12/20/2015 07:03    EKG:   Orders placed or performed during the hospital encounter of 12/10/15  . ED EKG within 10 minutes  . ED EKG within 10 minutes  . EKG 12-Lead  . 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  #1 acute hypoxic respiratory failure-secondary to pneumonia and pulmonary edema -Off of high flow nasal cannula. Remainson 3 L nasal cannula. - encouraged incentive spirometry -Blood cultures are not done on admission. Received cefepime x 7 days -CT chest showed bilateral pneumonia, but improved aeration, consolidation in left lung, now on levofloxacin every 48 hours. 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  Started peritoneal dialysis ,  However the drainage is not good, the patient will have hemodialysis catheter placed per nephrologist   #4 DM- on SSI and low dose lantus, novolog tid started. Blood glucose is ranging between 250- 300. Advance insulin Lantus to  20 units  subcutaneously at bedtime today, advanced NovoLog.   #5 hypertension-on Norvasc and Coreg, good control  #6 DVT Prophylaxis- SQ heparin  #7. Syncope due to orthostatic hypotension , the  patient  received a bolus of IV fluids, follow blood pressure readings, orthostatic vital signs were not done  #8. Generalized weakness, Physical therapy consult is ordere, not completed, likely will need skilled nursing facility placement   All the records are reviewed and case discussed with Care Management/Social Workerr. Management plans discussed with the patient, family and they are in agreement.  CODE STATUS: Full code  TOTAL TIME SPENT IN TAKING CARE OF THIS PATIENT: 40 minutes.  Discussed with family members extensively      Karrin Eisenmenger M.D on 12/20/2015 at 1:32 PM  Between 7am to 6pm - Pager - [phone]  After 6pm go to [email or web address] - password [email or web address] [facility]  Office  [phone]  CC: Primary care physician; Leonel Ramsay, MD

## 2015-12-20 NOTE — Care Management (Signed)
Assessed by physical therapy and at present recommending skilled nursing due to significant orthostatic hypotension.  Per nephrology this is due to over dialysis.  Peritoneal dialysis will be healed today and will be started on some iv fluids.  If patient does require skilled nursing, will have to start on hemodialysis.  Wife stated she was told that hemodialysis can be performed in the skilled nursing facility.  Informed her that is not true - patient must be set up in an outpatient clinic, be able to sit and have transportaiton coordinated.  Discussed that peritoneal dialysis management education can resume when returns home.  Will discuss with nephrology 10.3 to determine if cm needs to initiate a new HD referral.  Patient is followed by [facility]

## 2015-12-20 NOTE — Care Management (Signed)
Will manage findings from cardiac cath medically.

## 2015-12-21 LAB — RENAL FUNCTION PANEL
ANION GAP: 9 (ref 5–15)
Albumin: 2.6 g/dL — ABNORMAL LOW (ref 3.5–5.0)
BUN: 107 mg/dL — AB (ref 6–20)
CO2: 36 mmol/L — AB (ref 22–32)
Calcium: 7.8 mg/dL — ABNORMAL LOW (ref 8.9–10.3)
Chloride: 96 mmol/L — ABNORMAL LOW (ref 101–111)
Creatinine, Ser: 7.89 mg/dL — ABNORMAL HIGH (ref 0.61–1.24)
GFR calc Af Amer: 7 mL/min — ABNORMAL LOW (ref >60)
GFR calc non Af Amer: 6 mL/min — ABNORMAL LOW (ref >60)
GLUCOSE: 174 mg/dL — AB (ref 65–99)
POTASSIUM: 3.2 mmol/L — AB (ref 3.5–5.1)
Phosphorus: 6 mg/dL — ABNORMAL HIGH (ref 2.5–4.6)
SODIUM: 141 mmol/L (ref 135–145)

## 2015-12-21 LAB — GLUCOSE, CAPILLARY
GLUCOSE-CAPILLARY: 90 mg/dL (ref 65–99)
Glucose-Capillary: 160 mg/dL — ABNORMAL HIGH (ref 65–99)
Glucose-Capillary: 130 mg/dL — ABNORMAL HIGH (ref 65–99)
Glucose-Capillary: 133 mg/dL — ABNORMAL HIGH (ref 65–99)

## 2015-12-21 LAB — CBC
HEMATOCRIT: 32.7 % — AB (ref 40.0–52.0)
HEMOGLOBIN: 11.4 g/dL — AB (ref 13.0–18.0)
MCH: 32.3 pg (ref 26.0–34.0)
MCHC: 34.8 g/dL (ref 32.0–36.0)
MCV: 92.7 fL (ref 80.0–100.0)
Platelets: 284 10*3/uL (ref 150–440)
RBC: 3.52 MIL/uL — ABNORMAL LOW (ref 4.40–5.90)
RDW: 12.5 % (ref 11.5–14.5)
WBC: 7.9 10*3/uL (ref 3.8–10.6)

## 2015-12-21 LAB — HEPATITIS B SURFACE ANTIBODY, QUANTITATIVE: Hepatitis B-Post: <3.1 m[IU]/mL — ABNORMAL LOW (ref >9.9)

## 2015-12-21 LAB — HEPATITIS B SURFACE ANTIGEN: Hepatitis B Surface Ag: NEGATIVE

## 2015-12-21 MED ORDER — METOPROLOL SUCCINATE ER 50 MG PO TB24
50.0000 mg | ORAL_TABLET | Freq: Every day | ORAL | Status: DC
  Administered 2015-12-21 – 2015-12-24 (×4): 50 mg via ORAL
  Filled 2015-12-21 (×4): qty 1

## 2015-12-21 MED ORDER — CALCIUM ACETATE (PHOS BINDER) 667 MG PO CAPS
667.0000 mg | ORAL_CAPSULE | Freq: Three times a day (TID) | ORAL | Status: DC
  Administered 2015-12-21 – 2015-12-24 (×11): 667 mg via ORAL
  Filled 2015-12-21 (×11): qty 1

## 2015-12-21 MED ORDER — CLOPIDOGREL BISULFATE 75 MG PO TABS
75.0000 mg | ORAL_TABLET | Freq: Every day | ORAL | Status: DC
  Administered 2015-12-21 – 2015-12-24 (×4): 75 mg via ORAL
  Filled 2015-12-21 (×4): qty 1

## 2015-12-21 NOTE — Progress Notes (Signed)
Orthostatic VS with PT were as follows : supine (122/64), sitting (112/64), standing (80/52). Per PT patient was less symptomatic than day before. Dr. Juleen China notified of orthostatic vitals. Will continue to monitor patient and will change rate of IV fluids to 75/hr.

## 2015-12-21 NOTE — Progress Notes (Signed)
MD notified of 3.2 potassium. MD suggests no interventions at this time.

## 2015-12-21 NOTE — Progress Notes (Signed)
Physical Therapy Treatment Patient Details Name: Todd [name] MRN: [id number] DOB: 13-Apr-1940 Today's Date: 12/21/2015    History of Present Illness Pt. presented to ED with SOB, admitted with pneumonia B, transferred to CCU 9/23-9/27 for continued breathing difficulties. additionally experienced acute MI, on peritoneal dialysis. hx. stroke in nov. 2016     PT Comments    Pt. Supine in bed upon arrival. Able to progress mobility today however, pt. Tolerance to activity continues to be limited by orthostasis. BP assessed throughout session; prior to treatment in supine 122/64, sitting EOB 112/64, standing at EOB 80/52 pt. Became symptomatic, return to sitting EOB 112/55, following transfer to the chair 94/45 pt. Asymtomatic. Pt. Able to perform bed mobility supervision, requires CGA for sit<>stand transfers with RW demonstrates safe technique without instruction. Able to perform approx. 66ft. Of gait minA with RW for B UE support, pt. Demonstrates step to pattern with short steps, requires verbal cues to keep RW close throughout distance, requires B UE support to maintain balance with static and dynamic movements in standing. Able to perform sitting exercises with pause for rest in between. Recommend use of RW for all mobility at this time. Would benefit from skilled PT to address above deficits and promote optimal return to PLOF Recommend SNF placement upon d/c to follow up with further skilled PT needs.  Follow Up Recommendations  SNF     Equipment Recommendations  Rolling walker with 5" wheels    Recommendations for Other Services       Precautions / Restrictions Precautions Precautions: Fall (Peritoneal tube in abdomen for dialysis) Restrictions Weight Bearing Restrictions: No    Mobility  Bed Mobility Overal bed mobility: Needs Assistance Bed Mobility: Supine to Sit     Supine to sit: Supervision     General bed mobility comments: Pt. demonstrates ability to initiate B LE  and trunk movements, able to position EOB with assist of bedrail and HOB elevated  Transfers Overall transfer level: Needs assistance Equipment used: Rolling walker (2 wheeled) Transfers: Sit to/from Stand Sit to Stand: Min guard         General transfer comment: pt. able to sit<>stand from EOB x2 and recliner chair, able to demonstrate safe technique without instruction  Ambulation/Gait Ambulation/Gait assistance: Min assist Ambulation Distance (Feet): 3 Feet Assistive device: Rolling walker (2 wheeled)       General Gait Details: Pt. demonstrates short steps, step to pattern when ambulating short distance from bed to chair. requires verbal cues to keep RW close, requires RW for B UE support to maintain stability    Stairs            Wheelchair Mobility    Modified Rankin (Stroke Patients Only)       Balance Overall balance assessment: Needs assistance Sitting-balance support: Feet supported Sitting balance-Leahy Scale: Good Sitting balance - Comments: Pt. demonstrates good sitting balance EOB, able to perform multiple LE/UE dynamic movements while maintaining stability    Standing balance support: Bilateral upper extremity supported Standing balance-Leahy Scale: Fair Standing balance comment: Pt. unsteady without BUE support in standing. Able to maintain dynamic/static standing balance with use of RW for BUE support                    Cognition Arousal/Alertness: Awake/alert Behavior During Therapy: WFL for tasks assessed/performed Overall Cognitive Status: Within Functional Limits for tasks assessed  Exercises Other Exercises Other Exercises: sitting EOB exercises; LAQ x20B, UE PNF movements x10B, shoulder abduction x10B, shoulder horizontal ab/add x10B    General Comments        Pertinent Vitals/Pain Pain Assessment: No/denies pain    Home Living                      Prior Function            PT  Goals (current goals can now be found in the care plan section) Acute Rehab PT Goals Patient Stated Goal: pt. would like to return to independence at home PT Goal Formulation: With patient Time For Goal Achievement: 01/03/16 Potential to Achieve Goals: Fair Progress towards PT goals: Progressing toward goals    Frequency    Min 2X/week      PT Plan Current plan remains appropriate    Co-evaluation             End of Session Equipment Utilized During Treatment: Gait belt;Oxygen Activity Tolerance: Patient tolerated treatment well;Treatment limited secondary to medical complications (Comment) (Pt. continues to experience orthostasis, able to progress mobility relative to yesterday) Patient left: in chair;with call bell/phone within reach;with chair alarm set     Time: 1100-1125 PT Time Calculation (min) (ACUTE ONLY): 25 min  Charges:                       G Codes:        Melanie Crazier, SPT  12/21/15,4:43 PM

## 2015-12-21 NOTE — Care Management (Signed)
Spoke with attending and nephrology.  Patient  remains orthostatic and has a 40 point drop from lying to standing.  Patient is having a decrease in dizziness.  Will remain on  IVF.  Awaiting physical therapy evaluation to determine if recommendation for skilled nursing will remain.  Nephrology states will defer any future dialysis decisions to patient [facility] physician.  Discussed if patient is able to discharge home, training can resume on peritoneal dialysis.  Wife has expressed desire for patient to go to skilled short term to regain his strength.  If goes to skilled  Discussed if patient does require dialysis, facility would not be abe to provide the treatment.  A bed search has been initiated

## 2015-12-21 NOTE — Progress Notes (Signed)
Pt. Slept throughout the night with no signs or c/o pain, SOB or acute distress note.

## 2015-12-21 NOTE — Progress Notes (Signed)
[facility] [place] NAME: Todd [name]    MR#:  [id number]  DATE OF BIRTH:  01/30/1941  SUBJECTIVE:  CHIEF COMPLAINT:   Chief Complaint  Patient presents with  . Shortness of Breath   - feels Better today, after receiving some IV fluids, less lightheaded or dizzy, less nauseated. Peritoneal dialysis catheter drainage is poor, per nephrologist, there was a plan to initiate hemodialysis prior to discharge to skilled nursing facility, however, patient has improved with a IV fluid administration and the skilled nursing facility placement may not be needed. Orthostatic vital signs are still positive with blood pressure being in 80s when patient stands and 122 whenever he lays down . Nephrologist recommends one more day of IV fluid administration. He also feels that decision about hemodialysis will be made when patient visits his primary nephrologist as outpatient.  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 Medical management was suggested - weaned off high flow nasal cannula, remains on 2-3L now. Denies any shortness of breath, lower extremity swelling.   REVIEW OF SYSTEMS:  Review of Systems  Constitutional: Negative for chills, fever and malaise/fatigue.  HENT: Negative for ear discharge and ear pain.   Eyes: Negative for blurred vision and double vision.  Respiratory: Positive for cough, hemoptysis and shortness of breath. Negative for wheezing.   Cardiovascular: Negative for chest pain, palpitations and leg swelling.  Gastrointestinal: Positive for nausea and vomiting. Negative for abdominal pain, constipation and diarrhea.  Genitourinary: Negative for dysuria and urgency.  Musculoskeletal: Negative for myalgias.  Neurological: Negative for dizziness, sensory change, speech change, focal weakness, seizures and headaches.  Psychiatric/Behavioral: Negative for depression.    DRUG  ALLERGIES:   Allergies  Allergen Reactions  . Centrum Other (See Comments)    Bloating, indigestion, chest pain  . Valsartan Other (See Comments)    Unsure of symptoms, was on list from previous hospital and patient was unsure of name of medicine.  . Vitamin D Analogs Other (See Comments)    Bloating and swelling in abdomen region, indigestion. Prescription vit d not otc    VITALS:  Blood pressure 119/66, pulse 84, temperature 97.9 F (36.6 C), temperature source Oral, resp. rate 18, height 5\' 9"  (1.753 m), weight 80.2 kg (176 lb 11.2 oz), SpO2 98 %.  PHYSICAL EXAMINATION:  Physical Exam  GENERAL:  75 y.o.-year-old patient Sitting in the chair in no acute distress.  Much more comfortable, better facial skin color, no more sunken eyes  EYES: Pupils equal, round, reactive to light and accommodation. No scleral icterus. Extraocular muscles intact.  HEENT: Head atraumatic, normocephalic. Oropharynx and nasopharynx clear.  NECK:  Supple, no jugular venous distention. No thyroid enlargement, no tenderness.  LUNGS: Satisfactory air entrance bilaterally , no wheezing, fine crackles bilaterally, scattered in the lung fields anteriorly, no rhonchi . No use of accessory muscles of respiration.  CARDIOVASCULAR: S1, S2 normal. No murmurs, rubs, or gallops.  ABDOMEN: Soft, nontender, nondistended. Bowel sounds present. No organomegaly or mass. Peritoneal dialysis catheter present. Bruising on the right side of the abdomen superficially, tenderness to palpation on the right side, but no peritoneal signs EXTREMITIES: No pedal edema, cyanosis, or clubbing.  NEUROLOGIC: Cranial nerves II through XII are intact. Muscle strength 5/5 in all extremities. Sensation intact. Gait not checked.  PSYCHIATRIC: The patient is alert and oriented x 3.  SKIN: No obvious rash, lesion, or ulcer.    LABORATORY PANEL:  CBC  Recent Labs Lab 12/21/15 0510  WBC 7.9  HGB 11.4*  HCT 32.7*  PLT 284    ------------------------------------------------------------------------------------------------------------------  Chemistries   Recent Labs Lab 12/16/15 0603  12/21/15 0510  NA 139  < > 141  K 3.6  < > 3.2*  CL 88*  < > 96*  CO2 34*  < > 36*  GLUCOSE 175*  < > 174*  BUN 105*  < > 107*  CREATININE 7.62*  < > 7.89*  CALCIUM 7.8*  < > 7.8*  MG 2.0  --   --   < > = values in this interval not displayed. ------------------------------------------------------------------------------------------------------------------  Cardiac Enzymes No results for input(s): TROPONINI in the last 168 hours. ------------------------------------------------------------------------------------------------------------------  RADIOLOGY:  Dg Chest Port 1 View  Result Date: 12/20/2015 CLINICAL DATA:  Hypertension.  Recent pneumonia EXAM: PORTABLE CHEST 1 VIEW COMPARISON:  December 14, 2015 FINDINGS: There is stable elevation right hemidiaphragm. There is mild atelectasis in the left base. There is no edema or consolidation. Heart size and pulmonary vascularity are normal. Central catheter tip is in the superior vena cava. No pneumothorax. There is atherosclerotic calcification in the aorta. There are healed rib fractures on the left. IMPRESSION: Mild atelectasis left base. Stable elevation right hemidiaphragm. No edema or consolidation. Aortic atherosclerosis. Electronically Signed   By: Lowella Grip III M.D.   On: 12/20/2015 07:03    EKG:   Orders placed or performed during the hospital encounter of 12/10/15  . ED EKG within 10 minutes  . ED EKG within 10 minutes  . EKG 12-Lead  . 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  #1  acute hypoxic respiratory failure-secondary to pneumonia and Less likely pulmonary edema -Off of high flow nasal cannula. Remains on 3 L nasal cannula. - encouraged incentive spirometry -Blood cultures are not done on admission. Received cefepime x 7 days -CT chest showed bilateral pneumonia, but improved aeration, consolidation in left lung, now on levofloxacin every 48 hours. 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  Started peritoneal dialysis ,  However the drainage was not good, the patient was thought to have hemodialysis catheter placed for dialysis, however, patient has good urinary output and may not need hemodialysis catheter for a while, per nephrologist . Patient is to return back to his primary nephrologist, and decide about a hemodialysis catheter if needed as outpatient.   #4 DM- on SSI and low dose lantus, novolog tid started. Blood glucose is ranging between 130- 250. Continue insulin Lantus at 20 units  subcutaneously at bedtime , advanced NovoLog.   #5 hypertension-on Norvasc and Coreg, good  control  #6 DVT Prophylaxis- SQ heparin , placed on hold due to significant bruising/bleeding in the abdominal wall subcutaneous tissues  #7. Syncope due to orthostatic hypotension , the  patient  has been receiving  IV  fluids since yesterday, following blood pressure readings, orthostatic vital signs are still positive with blood pressure tilting to 80s when patient stands  #8. Generalized weakness, Physical therapy consult is not completed, patient has improved clinically, may not need skilled nursing facility placement, discussed with social worker/care management today   All the records are reviewed and case discussed with Care Management/Social Workerr. Management plans discussed with the patient, family and they are in agreement.  CODE STATUS: Full code  TOTAL TIME SPENT IN TAKING CARE OF THIS PATIENT: 40 minutes.  Discussed with Dr. Charlesetta Shanks M.D on 12/21/2015 at 12:14 PM  Between 7am to 6pm - Pager - [phone]  After 6pm go to [email or web address] - password [email or web address] [facility]  Office  [phone]  CC: Primary care physician; Leonel Ramsay, MD

## 2015-12-21 NOTE — Progress Notes (Signed)
Subjective:   Holding PD last night. IV NS at [place]  Patient denies orthostatic symptoms this morning.   Objective:  Vital signs in last 24 hours:  Temp:  [97.7 F (36.5 C)-97.9 F (36.6 C)] 97.9 F (36.6 C) (10/03 0533) Pulse Rate:  [69-87] 84 (10/03 0855) Resp:  [16-18] 18 (10/03 0533) BP: (119-153)/(61-93) 119/66 (10/03 0855) SpO2:  [89 %-98 %] 98 % (10/03 0855) Weight:  [80.2 kg (176 lb 11.2 oz)-80.2 kg (176 lb 12.8 oz)] 80.2 kg (176 lb 11.2 oz) (10/03 0533)  Weight change: -0.953 kg (-2 lb 1.6 oz) Filed Weights   12/20/15 0434 12/21/15 0446 12/21/15 0533  Weight: 80.7 kg (178 lb) 80.2 kg (176 lb 12.8 oz) 80.2 kg (176 lb 11.2 oz)    Intake/Output:    Intake/Output Summary (Last 24 hours) at 12/21/15 1100 Last data filed at 12/21/15 0858  Gross per 24 hour  Intake          1983.33 ml  Output             2275 ml  Net          -291.67 ml     Physical Exam: General: No acute distress, sitting in the bed   HEENT Anicteric, moist oral mucous membranes  Neck Supple   Pulm/lungs CTAB normal effort   CVS/Heart S1S2 no rubs  Abdomen:  Soft, nontender, nondistended, PD catheter in place   Extremities: No lower extremity edema   Neurologic: Alert, oriented x 3 following commands  Skin: No acute rashes   Access: PD catheter       Basic Metabolic Panel:   Recent Labs Lab 12/15/15 0547 12/16/15 0603 12/17/15 0522 12/19/15 0425 12/21/15 0510  NA 141 139 138 139 141  K 3.6 3.6 3.5 3.1* 3.2*  CL 93* 88* 86* 87* 96*  CO2 33* 34* 33* 37* 36*  GLUCOSE 192* 175* 192* 245* 174*  BUN 98* 105* 111* 112* 107*  CREATININE 6.83* 7.62* 8.45* 8.68* 7.89*  CALCIUM 7.8* 7.8* 7.9* 7.9* 7.8*  MG 1.6* 2.0  --   --   --   PHOS 5.9*  --   --   --  6.0*     CBC:  Recent Labs Lab 12/15/15 0547 12/16/15 0603 12/19/15 0425 12/21/15 0510  WBC 4.8 5.5 7.0 7.9  HGB 10.6* 11.0* 11.4* 11.4*  HCT 29.4* 32.1* 33.2* 32.7*  MCV 91.3 92.9 93.1 92.7  PLT 204 228 275  284      Microbiology:  Recent Results (from the past 720 hour(s))  MRSA PCR Screening     Status: None   Collection Time: 12/11/15 11:47 AM  Result Value Ref Range Status   MRSA by PCR NEGATIVE NEGATIVE Final    Comment:        The GeneXpert MRSA Assay (FDA approved for NASAL specimens only), is one component of a comprehensive MRSA colonization surveillance program. It is not intended to diagnose MRSA infection nor to guide or monitor treatment for MRSA infections.   Culture, expectorated sputum-assessment     Status: None   Collection Time: 12/13/15  4:00 PM  Result Value Ref Range Status   Specimen Description SPUTUM  Final   Special Requests NONE  Final   Sputum evaluation   Final    Sputum specimen not acceptable for testing.  Please recollect.   SPOKE TO Tia Masker 12/13/15 @ 1955  [place]    Report Status 12/13/2015 FINAL  Final  Coagulation Studies: No results for input(s): LABPROT, INR in the last 72 hours.  Urinalysis: No results for input(s): COLORURINE, LABSPEC, PHURINE, GLUCOSEU, HGBUR, BILIRUBINUR, KETONESUR, PROTEINUR, UROBILINOGEN, NITRITE, LEUKOCYTESUR in the last 72 hours.  Invalid input(s): APPERANCEUR    Imaging: Dg Chest Port 1 View  Result Date: 12/20/2015 CLINICAL DATA:  Hypertension.  Recent pneumonia EXAM: PORTABLE CHEST 1 VIEW COMPARISON:  December 14, 2015 FINDINGS: There is stable elevation right hemidiaphragm. There is mild atelectasis in the left base. There is no edema or consolidation. Heart size and pulmonary vascularity are normal. Central catheter tip is in the superior vena cava. No pneumothorax. There is atherosclerotic calcification in the aorta. There are healed rib fractures on the left. IMPRESSION: Mild atelectasis left base. Stable elevation right hemidiaphragm. No edema or consolidation. Aortic atherosclerosis. Electronically Signed   By: Lowella Grip III M.D.   On: 12/20/2015 07:03     Medications:   . sodium  chloride 100 mL/hr at 12/20/15 2356   . aspirin EC  81 mg Oral Daily  . clopidogrel  75 mg Oral Daily  . dialysis solution 2.5% low-MG/low-CA   Intraperitoneal Q24H  . feeding supplement (ENSURE ENLIVE)  237 mL Oral BID BM  . gentamicin cream  1 application Topical Daily  . insulin aspart  0-5 Units Subcutaneous QHS  . insulin aspart  0-9 Units Subcutaneous TID WC  . insulin aspart  6 Units Subcutaneous TID WC  . insulin glargine  20 Units Subcutaneous QHS  . levofloxacin  500 mg Oral Q48H  . mouth rinse  15 mL Mouth Rinse BID  . metoprolol succinate  50 mg Oral Daily  . polyethylene glycol  17 g Oral Daily  . rosuvastatin  20 mg Oral QHS  . senna-docusate  2 tablet Oral BID  . sodium chloride flush  10-40 mL Intracatheter Q12H   acetaminophen **OR** acetaminophen, bisacodyl, LORazepam, ondansetron **OR** ondansetron (ZOFRAN) IV, oxyCODONE, oxyCODONE-acetaminophen, promethazine, sodium chloride, sodium chloride flush  Assessment/ Plan:  75 y.o. white male with medical problems of CKD st 5 followed by [place] [facility] nephrology, long standing DM, HTN,CAD , who was admitted to [facility] on 12/10/2015 for evaluation of shortness of breath.   1.  CKD stage V, progressed to ESRD: prior to admission was doing training for peritoneal dialysis at [place]. However seems that patient with overdiuresis and overdialysis.  - Holding peritoneal dialysis last night and then again tonight - Continue IV NS. Change to 105mL/hr  2. Anemia of chronic kidney disease.  Hemoglobin 11.4.  - Continue to hold off on epo.  3. Secondary Hyperparathyroidism: with hyperphosphatemia: not currently on binders or vitamin D agents.  - Start calcium acetate 667mg  1 tablet with meals.   4. Hypertension: blood pressure improved.  - orthostatic today - amlodipine, carvedilol - hold for now  5. Coronary artery disease: status post cardiac catheterization.  - appreciate cardiology input.    LOS: 10 Matylda Fehring,  Anisa Leanos 10/3/201711:00 AM

## 2015-12-21 NOTE — Care Management (Signed)
Spoke with patient about physical therapy continued recommendation of skilled nursing.  Discussed would need very close supervision if discharged hoe.  CM would contact data source to determine if any POD education could be provided while patient in a facility if chooses to go to snf.  Again discussed dialysis needs can not be accomodated in a skilled facility if it is needed.  There are no plans for a permcath

## 2015-12-22 DIAGNOSIS — L8990 Pressure ulcer of unspecified site, unspecified stage: Secondary | ICD-10-CM | POA: Insufficient documentation

## 2015-12-22 LAB — GLUCOSE, CAPILLARY
Glucose-Capillary: 133 mg/dL — ABNORMAL HIGH (ref 65–99)
Glucose-Capillary: 175 mg/dL — ABNORMAL HIGH (ref 65–99)
Glucose-Capillary: 140 mg/dL — ABNORMAL HIGH (ref 65–99)
Glucose-Capillary: 128 mg/dL — ABNORMAL HIGH (ref 65–99)

## 2015-12-22 LAB — BASIC METABOLIC PANEL
ANION GAP: 10 (ref 5–15)
BUN: 102 mg/dL — ABNORMAL HIGH (ref 6–20)
CALCIUM: 8 mg/dL — AB (ref 8.9–10.3)
CO2: 31 mmol/L (ref 22–32)
Chloride: 102 mmol/L (ref 101–111)
Creatinine, Ser: 7.34 mg/dL — ABNORMAL HIGH (ref 0.61–1.24)
GFR calc non Af Amer: 6 mL/min — ABNORMAL LOW (ref >60)
GFR, EST AFRICAN AMERICAN: 7 mL/min — AB (ref >60)
Glucose, Bld: 125 mg/dL — ABNORMAL HIGH (ref 65–99)
Potassium: 3.4 mmol/L — ABNORMAL LOW (ref 3.5–5.1)
SODIUM: 143 mmol/L (ref 135–145)

## 2015-12-22 MED ORDER — SODIUM CHLORIDE 0.9 % IV SOLN
INTRAVENOUS | Status: AC
  Administered 2015-12-22 – 2015-12-23 (×3): via INTRAVENOUS

## 2015-12-22 NOTE — Progress Notes (Signed)
Patient IJ dressing was change. Patient did not have any complaints, will continue to monitor.

## 2015-12-22 NOTE — Care Management (Signed)
Patient continues to require need for IV fluids.  Has decided to go to skilled nursing at discharge. The need for dialysis or not may significantly influence this decision.

## 2015-12-22 NOTE — Progress Notes (Signed)
Spoke with Dr. Benjie Karvonen about removing patient's central line, as patient has difficulty with dressing staying in place due to facial hair.  Dr. Benjie Karvonen wanted to keep central line for now Patient agreed to be shaved, and dressing changed and reinforced.

## 2015-12-22 NOTE — Clinical Social Work Note (Signed)
MSW presented bed offers to patient, he requested MSW to contact patient's wife.  MSW contacted patient's wife [name] and gave her bed offers for patient.  Patient's wife said she will review SNFs and make decision and then let MSW know.  MSW to continue to follow patient's progress throughout discharge planning.  Jones Broom. Yazhini Mcaulay, MSW [phone]  Mon-Fri 8a-4:30p 12/22/2015 3:07 PM

## 2015-12-22 NOTE — Progress Notes (Signed)
Nutrition Follow-up  DOCUMENTATION CODES:   Not applicable  INTERVENTION:  -Magic cup TID with meals, each supplement provides 290 kcal and 9 grams of protein -Encourage PO intake  NUTRITION DIAGNOSIS:   Increased nutrient needs related to other (see comment) (Dialysis) as evidenced by estimated needs. -ongoing  GOAL:   Patient will meet greater than or equal to 90% of their needs -Progressing  MONITOR:   PO intake, Supplement acceptance, I & O's, Labs, Weight trends, Skin  REASON FOR ASSESSMENT:   Consult Assessment of nutrition requirement/status  ASSESSMENT:   Todd [name]  is a 75 y.o. male who presents with Shortness of breath and episode of hypoxia. Patient went for catheter placement today in anticipation of starting dialysis sometime soon. Post catheter placement he had some altered mental status and was found to be hypoxic.  He is doing better. Had a lunch tray at bedside he completed 100% of. For breakfast he had cereal, toast, eggs he states he ate 100% of as well. No complaints at this time. Labs and medications reviewed.  Diet Order:  Diet renal/carb modified with fluid restriction Diet-HS Snack? Nothing; Room service appropriate? Yes; Fluid consistency: Thin  Skin:  Reviewed, no issues  Last BM:  9/30  Height:   Ht Readings from Last 1 Encounters:  12/10/15 5\' 9"  (1.753 m)    Weight:   Wt Readings from Last 1 Encounters:  12/22/15 178 lb 11.2 oz (81.1 kg)    Ideal Body Weight:  72.72 kg  BMI:  Body mass index is 26.39 kg/m.  Estimated Nutritional Needs:   Kcal:  2000-2400 calories  Protein:  80-95 gm  Fluid:  >/= 1.6L  EDUCATION NEEDS:   No education needs identified at this time  Satira Anis. Callen Zuba, MS, RD LDN Inpatient Clinical Dietitian Pager [phone]

## 2015-12-22 NOTE — Progress Notes (Signed)
[facility] [place] NAME: Todd [name]    MR#:  [id number]  DATE OF BIRTH:  11/02/1940  SUBJECTIVE:   No acute events overnight. Patient is not reporting chest pain or shortness of breath.  REVIEW OF SYSTEMS:    Review of Systems  Constitutional: Negative.  Negative for chills, fever and malaise/fatigue.  HENT: Negative.  Negative for ear discharge, ear pain, hearing loss, nosebleeds and sore throat.   Eyes: Negative.  Negative for blurred vision and pain.  Respiratory: Negative.  Negative for cough, hemoptysis, shortness of breath and wheezing.   Cardiovascular: Negative.  Negative for chest pain, palpitations and leg swelling.  Gastrointestinal: Negative.  Negative for abdominal pain, blood in stool, diarrhea, nausea and vomiting.  Genitourinary: Negative.  Negative for dysuria.  Musculoskeletal: Negative.  Negative for back pain.  Skin: Negative.   Neurological: Negative for dizziness, tremors, speech change, focal weakness, seizures and headaches.  Endo/Heme/Allergies: Negative.  Does not bruise/bleed easily.  Psychiatric/Behavioral: Negative.  Negative for depression, hallucinations and suicidal ideas.    Tolerating Diet:yes      DRUG ALLERGIES:   Allergies  Allergen Reactions  . Centrum Other (See Comments)    Bloating, indigestion, chest pain  . Valsartan Other (See Comments)    Unsure of symptoms, was on list from previous hospital and patient was unsure of name of medicine.  . Vitamin D Analogs Other (See Comments)    Bloating and swelling in abdomen region, indigestion. Prescription vit d not otc    VITALS:  Blood pressure (!) 141/66, pulse 64, temperature 98.1 F (36.7 C), resp. rate 16, height 5\' 9"  (1.753 m), weight 81.1 kg (178 lb 11.2 oz), SpO2 93 %.  PHYSICAL EXAMINATION:   Physical Exam  Constitutional: He is oriented to person, place, and time and well-developed, well-nourished, and in no distress. No  distress.  HENT:  Head: Normocephalic.  Eyes: No scleral icterus.  Neck: Normal range of motion. Neck supple. No JVD present. No tracheal deviation present.  Cardiovascular: Normal rate, regular rhythm and normal heart sounds.  Exam reveals no gallop and no friction rub.   No murmur heard. Pulmonary/Chest: Effort normal and breath sounds normal. No respiratory distress. He has no wheezes. He has no rales. He exhibits no tenderness.  Abdominal: Soft. Bowel sounds are normal. He exhibits no distension and no mass. There is no tenderness. There is no rebound and no guarding.  Musculoskeletal: Normal range of motion. He exhibits no edema.  Neurological: He is alert and oriented to person, place, and time.  Skin: Skin is warm. No rash noted. No erythema.  Psychiatric: Affect and judgment normal.      LABORATORY PANEL:   CBC  Recent Labs Lab 12/21/15 0510  WBC 7.9  HGB 11.4*  HCT 32.7*  PLT 284   ------------------------------------------------------------------------------------------------------------------  Chemistries   Recent Labs Lab 12/16/15 0603  12/22/15 0358  NA 139  < > 143  K 3.6  < > 3.4*  CL 88*  < > 102  CO2 34*  < > 31  GLUCOSE 175*  < > 125*  BUN 105*  < > 102*  CREATININE 7.62*  < > 7.34*  CALCIUM 7.8*  < > 8.0*  MG 2.0  --   --   < > = values in this interval not displayed. ------------------------------------------------------------------------------------------------------------------  Cardiac Enzymes No results for input(s): TROPONINI in the last 168 hours. ------------------------------------------------------------------------------------------------------------------  RADIOLOGY:  No results found.   ASSESSMENT  AND PLAN:   75 year old male with a history of chronic kidney disease stage V, diabetes and CAD who is admitted onset her 22nd for evaluation or shortness of breath and found to have pneumonia and acute non-ST elevation MI.  1.  Acute hypoxic respiratory failure due to pneumonia: Symptoms are improving. Wean oxygen as tolerated  continue Levaquin.  2. Non-ST elevation MI: Cardiac catheterization on October 2 shows 70% proximal LAD occlusion.  Cardiology to have conference tomorrow to decide whether the patient would benefit from surgery. Continue metoprolol, aspirin, Plavix and Crestor.  3. Chronic kidney disease stage V which progressed to ESRD: No peritoneal dialysis for today. If further cardiac intervention is required then patient may need to restart on dialysis.  4. Anemia of chronic kidney disease: Hemoglobin stable.  5. Essential hypertension: Continue  metoprolol  6. Diabetes: Continue sliding scale insulin and Lantus.    Management plans discussed with the patient and he is in agreement.  CODE STATUS: full  TOTAL TIME TAKING CARE OF THIS PATIENT: 28 minutes.   Discussed with Dr. Abigail Butts Round with nurses  POSSIBLE D/C tomorrow, DEPENDING ON CLINICAL CONDITION.   Kelvyn Schunk M.D on 12/22/2015 at 1:11 PM  Between 7am to 6pm - Pager - [phone] After 6pm go to [email or web address] - password [email or web address] [facility]  Office  [phone]  CC: Primary care physician; FITZGERALD, DAVID Mamie Nick, MD  Note: This dictation was prepared with Dragon dictation along with smaller phrase technology. Any transcriptional errors that result from this process are unintentional.

## 2015-12-22 NOTE — Progress Notes (Signed)
Subjective:   Holding PD.  Wife at bedside IV NS at 47  UOP 1725 (1975)  BUn 102 (107) Creatinine 7.34 (7.89)  Objective:  Vital signs in last 24 hours:  Temp:  [98 F (36.7 C)-98.1 F (36.7 C)] 98.1 F (36.7 C) (10/04 0428) Pulse Rate:  [70-79] 79 (10/04 0803) Resp:  [18] 18 (10/04 0428) BP: (135-169)/(54-81) 153/81 (10/04 0803) SpO2:  [91 %-98 %] 91 % (10/04 0803) Weight:  [81.1 kg (178 lb 11.2 oz)] 81.1 kg (178 lb 11.2 oz) (10/04 0428)  Weight change: 0.862 kg (1 lb 14.4 oz) Filed Weights   12/21/15 0446 12/21/15 0533 12/22/15 0428  Weight: 80.2 kg (176 lb 12.8 oz) 80.2 kg (176 lb 11.2 oz) 81.1 kg (178 lb 11.2 oz)    Intake/Output:    Intake/Output Summary (Last 24 hours) at 12/22/15 1044 Last data filed at 12/22/15 1018  Gross per 24 hour  Intake          2009.58 ml  Output             1625 ml  Net           384.58 ml     Physical Exam: General: No acute distress, sitting in chair   HEENT Anicteric, moist oral mucous membranes  Neck Supple   Pulm/lungs CTAB normal effort   CVS/Heart S1S2 no rubs  Abdomen:  Soft, nontender, nondistended, PD catheter in place   Extremities: No lower extremity edema   Neurologic: Alert, oriented x 3 following commands  Skin: No acute rashes   Access: PD catheter       Basic Metabolic Panel:   Recent Labs Lab 12/16/15 0603 12/17/15 0522 12/19/15 0425 12/21/15 0510 12/22/15 0358  NA 139 138 139 141 143  K 3.6 3.5 3.1* 3.2* 3.4*  CL 88* 86* 87* 96* 102  CO2 34* 33* 37* 36* 31  GLUCOSE 175* 192* 245* 174* 125*  BUN 105* 111* 112* 107* 102*  CREATININE 7.62* 8.45* 8.68* 7.89* 7.34*  CALCIUM 7.8* 7.9* 7.9* 7.8* 8.0*  MG 2.0  --   --   --   --   PHOS  --   --   --  6.0*  --      CBC:  Recent Labs Lab 12/16/15 0603 12/19/15 0425 12/21/15 0510  WBC 5.5 7.0 7.9  HGB 11.0* 11.4* 11.4*  HCT 32.1* 33.2* 32.7*  MCV 92.9 93.1 92.7  PLT 228 275 284      Microbiology:  Recent Results (from the past 720  hour(s))  MRSA PCR Screening     Status: None   Collection Time: 12/11/15 11:47 AM  Result Value Ref Range Status   MRSA by PCR NEGATIVE NEGATIVE Final    Comment:        The GeneXpert MRSA Assay (FDA approved for NASAL specimens only), is one component of a comprehensive MRSA colonization surveillance program. It is not intended to diagnose MRSA infection nor to guide or monitor treatment for MRSA infections.   Culture, expectorated sputum-assessment     Status: None   Collection Time: 12/13/15  4:00 PM  Result Value Ref Range Status   Specimen Description SPUTUM  Final   Special Requests NONE  Final   Sputum evaluation   Final    Sputum specimen not acceptable for testing.  Please recollect.   SPOKE TO Tia Masker 12/13/15 @ 1955  [place]    Report Status 12/13/2015 FINAL  Final    Coagulation Studies: No  results for input(s): LABPROT, INR in the last 72 hours.  Urinalysis: No results for input(s): COLORURINE, LABSPEC, PHURINE, GLUCOSEU, HGBUR, BILIRUBINUR, KETONESUR, PROTEINUR, UROBILINOGEN, NITRITE, LEUKOCYTESUR in the last 72 hours.  Invalid input(s): APPERANCEUR    Imaging: No results found.   Medications:   . sodium chloride 75 mL/hr at 12/21/15 1206   . aspirin EC  81 mg Oral Daily  . calcium acetate  667 mg Oral TID WC  . clopidogrel  75 mg Oral Daily  . dialysis solution 2.5% low-MG/low-CA   Intraperitoneal Q24H  . feeding supplement (ENSURE ENLIVE)  237 mL Oral BID BM  . gentamicin cream  1 application Topical Daily  . insulin aspart  0-5 Units Subcutaneous QHS  . insulin aspart  0-9 Units Subcutaneous TID WC  . insulin aspart  6 Units Subcutaneous TID WC  . insulin glargine  20 Units Subcutaneous QHS  . levofloxacin  500 mg Oral Q48H  . mouth rinse  15 mL Mouth Rinse BID  . metoprolol succinate  50 mg Oral Daily  . polyethylene glycol  17 g Oral Daily  . rosuvastatin  20 mg Oral QHS  . senna-docusate  2 tablet Oral BID  . sodium chloride flush   10-40 mL Intracatheter Q12H   acetaminophen **OR** acetaminophen, bisacodyl, LORazepam, ondansetron **OR** ondansetron (ZOFRAN) IV, oxyCODONE, oxyCODONE-acetaminophen, promethazine, sodium chloride, sodium chloride flush  Assessment/ Plan:  75 y.o. white male with medical problems of CKD st 5 followed by [facility] nephrology, long standing DM, HTN,CAD , who was admitted to [facility] on 12/10/2015 for evaluation of shortness of breath.   1.  CKD stage V, progressed to ESRD: prior to admission was doing training for peritoneal dialysis at [place]. However seems that patient with overdiuresis and overdialysis.  - Holding peritoneal dialysis  - Continue IV NS. 49mL/hr - If further cardiac intervention required, consider restarting dialysis.   2. Anemia of chronic kidney disease.  - Continue to hold off on epo.  3. Secondary Hyperparathyroidism: with hyperphosphatemia:  - Started calcium acetate 667mg  1 tablet with meals.   4. Hypertension: blood pressure improved.  - amlodipine, carvedilol - hold for now - Check orthostatics.   5. Coronary artery disease: status post cardiac catheterization.  - appreciate cardiology input.    LOS: [place], [place] 10/4/201710:44 AM

## 2015-12-23 LAB — BASIC METABOLIC PANEL
ANION GAP: 6 (ref 5–15)
BUN: 98 mg/dL — AB (ref 6–20)
CO2: 31 mmol/L (ref 22–32)
Calcium: 7.8 mg/dL — ABNORMAL LOW (ref 8.9–10.3)
Chloride: 106 mmol/L (ref 101–111)
Creatinine, Ser: 6.67 mg/dL — ABNORMAL HIGH (ref 0.61–1.24)
GFR, EST AFRICAN AMERICAN: 8 mL/min — AB (ref >60)
GFR, EST NON AFRICAN AMERICAN: 7 mL/min — AB (ref >60)
Glucose, Bld: 78 mg/dL (ref 65–99)
POTASSIUM: 3.4 mmol/L — AB (ref 3.5–5.1)
SODIUM: 143 mmol/L (ref 135–145)

## 2015-12-23 LAB — CBC
HEMATOCRIT: 28.8 % — AB (ref 40.0–52.0)
HEMOGLOBIN: 10.2 g/dL — AB (ref 13.0–18.0)
MCH: 32.8 pg (ref 26.0–34.0)
MCHC: 35.6 g/dL (ref 32.0–36.0)
MCV: 92.4 fL (ref 80.0–100.0)
Platelets: 272 10*3/uL (ref 150–440)
RBC: 3.12 MIL/uL — AB (ref 4.40–5.90)
RDW: 12.8 % (ref 11.5–14.5)
WBC: 8.3 10*3/uL (ref 3.8–10.6)

## 2015-12-23 LAB — RENAL FUNCTION PANEL
ALBUMIN: 2.5 g/dL — AB (ref 3.5–5.0)
ANION GAP: 7 (ref 5–15)
BUN: 96 mg/dL — ABNORMAL HIGH (ref 6–20)
CALCIUM: 7.8 mg/dL — AB (ref 8.9–10.3)
CO2: 31 mmol/L (ref 22–32)
Chloride: 106 mmol/L (ref 101–111)
Creatinine, Ser: 6.62 mg/dL — ABNORMAL HIGH (ref 0.61–1.24)
GFR calc non Af Amer: 7 mL/min — ABNORMAL LOW (ref >60)
GFR, EST AFRICAN AMERICAN: 8 mL/min — AB (ref >60)
Glucose, Bld: 76 mg/dL (ref 65–99)
PHOSPHORUS: 5 mg/dL — AB (ref 2.5–4.6)
POTASSIUM: 3.5 mmol/L (ref 3.5–5.1)
Sodium: 144 mmol/L (ref 135–145)

## 2015-12-23 LAB — GLUCOSE, CAPILLARY
GLUCOSE-CAPILLARY: 82 mg/dL (ref 65–99)
GLUCOSE-CAPILLARY: 126 mg/dL — AB (ref 65–99)
GLUCOSE-CAPILLARY: 154 mg/dL — AB (ref 65–99)
GLUCOSE-CAPILLARY: 232 mg/dL — AB (ref 65–99)

## 2015-12-23 MED ORDER — CLOPIDOGREL BISULFATE 75 MG PO TABS: 75.0000 mg | ORAL_TABLET | Freq: Every day | ORAL | 0 refills | Status: AC

## 2015-12-23 MED ORDER — METOPROLOL SUCCINATE ER 50 MG PO TB24: 50.0000 mg | ORAL_TABLET | Freq: Every day | ORAL | 0 refills | Status: DC

## 2015-12-23 NOTE — Clinical Social Work Note (Signed)
MSW received referral for SNF.  Case discussed with case manager and plan for patient to transfer to [facility].  MSW to sign off please re-consult if social work needs arise.  Jones Broom. Kelle Ruppert, MSW Mon-Fri 8a-4:30p [phone]

## 2015-12-23 NOTE — Progress Notes (Signed)
Subjective:   Holding PD.  Wife at bedside IV NS at 35  Cardiology for medical management.   Objective:  Vital signs in last 24 hours:  Temp:  [97.3 F (36.3 C)-98.1 F (36.7 C)] 98 F (36.7 C) (10/05 0745) Pulse Rate:  [64-69] 69 (10/05 0745) Resp:  [15-16] 16 (10/05 0745) BP: (141-174)/(63-69) 174/66 (10/05 0820) SpO2:  [93 %-100 %] 98 % (10/05 0745) Weight:  [80.9 kg (178 lb 6.4 oz)] 80.9 kg (178 lb 6.4 oz) (10/05 0354)  Weight change: -0.136 kg (-4.8 oz) Filed Weights   12/21/15 0533 12/22/15 0428 12/23/15 0354  Weight: 80.2 kg (176 lb 11.2 oz) 81.1 kg (178 lb 11.2 oz) 80.9 kg (178 lb 6.4 oz)    Intake/Output:    Intake/Output Summary (Last 24 hours) at 12/23/15 1027 Last data filed at 12/23/15 0741  Gross per 24 hour  Intake              610 ml  Output              650 ml  Net              -40 ml     Physical Exam: General: No acute distress, sitting in chair   HEENT Anicteric, moist oral mucous membranes  Neck Supple   Pulm/lungs Clear bilaterally, room air  CVS/Heart S1S2 no rubs  Abdomen:  Soft, nontender, nondistended, PD catheter in place   Extremities: No lower extremity edema   Neurologic: Alert, oriented x 3 following commands  Skin: No acute rashes   Access: PD catheter       Basic Metabolic Panel:   Recent Labs Lab 12/17/15 0522 12/19/15 0425 12/21/15 0510 12/22/15 0358 12/23/15 0500  NA 138 139 141 143 144  143  K 3.5 3.1* 3.2* 3.4* 3.5  3.4*  CL 86* 87* 96* 102 106  106  CO2 33* 37* 36* 31 31  31   GLUCOSE 192* 245* 174* 125* 76  78  BUN 111* 112* 107* 102* 96*  98*  CREATININE 8.45* 8.68* 7.89* 7.34* 6.62*  6.67*  CALCIUM 7.9* 7.9* 7.8* 8.0* 7.8*  7.8*  PHOS  --   --  6.0*  --  5.0*     CBC:  Recent Labs Lab 12/19/15 0425 12/21/15 0510 12/23/15 0500  WBC 7.0 7.9 8.3  HGB 11.4* 11.4* 10.2*  HCT 33.2* 32.7* 28.8*  MCV 93.1 92.7 92.4  PLT 275 284 272      Microbiology:  Recent Results (from the past  720 hour(s))  MRSA PCR Screening     Status: None   Collection Time: 12/11/15 11:47 AM  Result Value Ref Range Status   MRSA by PCR NEGATIVE NEGATIVE Final    Comment:        The GeneXpert MRSA Assay (FDA approved for NASAL specimens only), is one component of a comprehensive MRSA colonization surveillance program. It is not intended to diagnose MRSA infection nor to guide or monitor treatment for MRSA infections.   Culture, expectorated sputum-assessment     Status: None   Collection Time: 12/13/15  4:00 PM  Result Value Ref Range Status   Specimen Description SPUTUM  Final   Special Requests NONE  Final   Sputum evaluation   Final    Sputum specimen not acceptable for testing.  Please recollect.   SPOKE TO Tia Masker 12/13/15 @ 1955  [place]    Report Status 12/13/2015 FINAL  Final    Coagulation Studies: No  results for input(s): LABPROT, INR in the last 72 hours.  Urinalysis: No results for input(s): COLORURINE, LABSPEC, PHURINE, GLUCOSEU, HGBUR, BILIRUBINUR, KETONESUR, PROTEINUR, UROBILINOGEN, NITRITE, LEUKOCYTESUR in the last 72 hours.  Invalid input(s): APPERANCEUR    Imaging: No results found.   Medications:   . sodium chloride 75 mL/hr at 12/23/15 0348   . aspirin EC  81 mg Oral Daily  . calcium acetate  667 mg Oral TID WC  . clopidogrel  75 mg Oral Daily  . dialysis solution 2.5% low-MG/low-CA   Intraperitoneal Q24H  . feeding supplement (ENSURE ENLIVE)  237 mL Oral BID BM  . gentamicin cream  1 application Topical Daily  . insulin aspart  0-5 Units Subcutaneous QHS  . insulin aspart  0-9 Units Subcutaneous TID WC  . insulin aspart  6 Units Subcutaneous TID WC  . insulin glargine  20 Units Subcutaneous QHS  . mouth rinse  15 mL Mouth Rinse BID  . metoprolol succinate  50 mg Oral Daily  . polyethylene glycol  17 g Oral Daily  . rosuvastatin  20 mg Oral QHS  . senna-docusate  2 tablet Oral BID  . sodium chloride flush  10-40 mL Intracatheter Q12H    acetaminophen **OR** acetaminophen, bisacodyl, ondansetron **OR** ondansetron (ZOFRAN) IV, oxyCODONE, oxyCODONE-acetaminophen, promethazine, sodium chloride, sodium chloride flush  Assessment/ Plan:  75 y.o. white male with medical problems of CKD st 5 followed by [facility] nephrology, long standing DM, HTN,CAD , who was admitted to [facility] on 12/10/2015 for evaluation of shortness of breath.   1.  CKD stage V, progressed to ESRD: prior to admission was doing training for peritoneal dialysis at [place]. However seems that patient with overdiuresis and overdialysis.  - Holding peritoneal dialysis  - Currently on IV NS. 76mL/hr - If further cardiac intervention required, consider restarting dialysis.   2. Anemia of chronic kidney disease.  - Continue to hold off on epo.  3. Secondary Hyperparathyroidism: with hyperphosphatemia: not on vitamin D agent - Started calcium acetate 667mg  1 tablet with meals.   4. Hypertension: blood pressure improved.  - amlodipine, carvedilol - hold for now  5. Coronary artery disease: status post cardiac catheterization. Recommend medical management.  - appreciate cardiology input.    LOS: [place], [place] 10/5/201710:27 AM

## 2015-12-23 NOTE — Progress Notes (Signed)
BIPAP is not in room. Pt is in no distress. Pt doesn't feel like he needs the BIPAP

## 2015-12-23 NOTE — Care Management (Signed)
Awaiting transfer to [facility].  spoke with Eulah Citizen insuranced case manager.  Transfer indicated due to continued decline in renal status and patient's nephrologist is at [facility]  [facility] is in network with patient's insurance plan

## 2015-12-23 NOTE — Discharge Summary (Addendum)
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 CKD (chronic kidney disease)   . Coronary artery disease   . CVA (cerebrovascular accident) ([place])   . Diabetes mellitus    lantus and novolog;fasting sugars run 70-120  . Hyperlipidemia    takes Crestor daily  . Hypertension    takes Amlodipine and Metoprolol  . Joint pain    knees,elbows,back  . PONV (postoperative nausea and vomiting)     HOSPITAL COURSE:   75 year old male with a history of chronic kidney disease stage V, diabetes and CAD who is admitted onset her 22nd for evaluation or shortness of breath and found to have pneumonia and acute non-ST elevation MI.  1. Acute hypoxic respiratory failure due to pneumonia: Symptoms have improved. He is weaned off of oxygen and has been treated with Levaquin. He has completed full course of treatment.   2. Non-ST elevation MI: Cardiac catheterization on October 2 shows 70% proximal LAD occlusion.  Cardiology is recommending medical management .Continue metoprolol, aspirin, Plavix and Crestor.  3. Chronic kidney disease stage V which progressed to ESRD: Due to overdiuresis  patient did have peritoneal dialysis while in the hospital. He will be transferred to [facility] for initiation of permanent dialysis.   4. Anemia of chronic kidney disease: Hemoglobin stable.  5. Essential hypertension: Continue  metoprolol  6. Diabetes: Continue sliding scale insulin, ADA diet and Lantus.    DISCHARGE CONDITIONS AND DIET:    stable for transfer to [place]  CONSULTS OBTAINED:  Treatment Team:  Murlean Iba, MD Isaias Cowman, MD  DRUG ALLERGIES:   Allergies  Allergen Reactions  . Centrum Other (See Comments)    Bloating, indigestion, chest pain  . Valsartan Other (See Comments)    Unsure of symptoms, was on list from previous hospital and patient was unsure of name of medicine.  . Vitamin D Analogs Other (See Comments)    Bloating and swelling in abdomen region, indigestion. Prescription vit d not otc    DISCHARGE MEDICATIONS:   Current Discharge Medication List    START taking these medications   Details  clopidogrel (PLAVIX) 75 MG tablet Take 1 tablet (75 mg total) by mouth daily. Qty: 30 tablet, Refills: 0    metoprolol succinate (TOPROL-XL) 50 MG 24 hr tablet Take 1 tablet (50 mg total) by mouth daily. Take with or immediately following a meal. Qty: 30 tablet, Refills: 0      CONTINUE these medications which have NOT CHANGED   Details  amLODipine (NORVASC) 10 MG tablet Take 10 mg by mouth daily.     aspirin EC 81 MG tablet Take 1 tablet (81 mg total)  by mouth daily. Qty: 1 tablet, Refills: 30    cholecalciferol (VITAMIN D) 1000 UNITS tablet Take 1,000 Units by mouth daily.      econazole nitrate 1 % cream Apply 1 application topically daily.    !! glucose blood (ONE TOUCH ULTRA TEST) test strip 1 each by Other route 4 (four) times daily.     insulin glargine (LANTUS) 100 UNIT/ML injection Inject 5-8 Units into the skin at bedtime. 5 units on Sunday, Tuesday, and Thursday. 8 units on Monday, Wednesday,  Friday, and Saturday.    insulin lispro (HUMALOG) 100 UNIT/ML injection Inject 5 Units into the skin daily as needed for high blood sugar. Takes at lunch if blood sugar is over 150    !! ONE TOUCH ULTRA TEST test strip 1 each by Other route 4 (four) times daily.     oxyCODONE-acetaminophen (PERCOCET/ROXICET) 5-325 MG tablet Take 1-2 tablets by mouth every 4 (four) hours as needed for severe pain.    rosuvastatin (CRESTOR) 20 MG tablet Take 20 mg by mouth at bedtime.     sodium bicarbonate 650 MG tablet Take 1,950 mg by mouth 3 (three) times daily.     sodium polystyrene (KAYEXALATE) 15 GM/60ML suspension Take 15 g by mouth 2 (two) times a week.      !! - Potential duplicate medications found. Please discuss with provider.    STOP taking these medications     carvedilol (COREG) 6.25 MG tablet               Today   CHIEF COMPLAINT:  Doing well today Tired of sitting around  VITAL SIGNS:  Blood pressure (!) 149/72, pulse 64, temperature 97.6 F (36.4 C), temperature source Oral, resp. rate 18, height 5\' 9"  (1.753 m), weight 80.9 kg (178 lb 6.4 oz), SpO2 94 %.   REVIEW OF SYSTEMS:  Review of Systems  Constitutional: Negative.  Negative for chills, fever and malaise/fatigue.  HENT: Negative.  Negative for ear discharge, ear pain, hearing loss, nosebleeds and sore throat.   Eyes: Negative.  Negative for blurred vision and pain.  Respiratory: Negative.  Negative for cough, hemoptysis, shortness of breath and wheezing.   Cardiovascular: Negative.  Negative for chest pain, palpitations and leg swelling.  Gastrointestinal: Negative.  Negative for abdominal pain, blood in stool, diarrhea, nausea and vomiting.  Genitourinary: Negative.  Negative for dysuria.  Musculoskeletal: Negative.  Negative for back pain.  Skin: Negative.   Neurological: Negative for dizziness, tremors, speech change, focal weakness, seizures and headaches.  Endo/Heme/Allergies: Negative.  Does not  bruise/bleed easily.  Psychiatric/Behavioral: Negative.  Negative for depression, hallucinations and suicidal ideas.     PHYSICAL EXAMINATION:  GENERAL:  75 y.o.-year-old patient lying in the bed with no acute distress.  NECK:  Supple, no jugular venous distention. No thyroid enlargement, no tenderness. IJ line placed LUNGS: Normal breath sounds bilaterally, no wheezing, rales,rhonchi  No use of accessory muscles of respiration.  CARDIOVASCULAR: S1, S2 normal. No murmurs, rubs, or gallops.  ABDOMEN: Soft, non-tender, non-distended. Bowel sounds present. No organomegaly or mass.  EXTREMITIES: No pedal edema, cyanosis, or clubbing.  PSYCHIATRIC: The patient is alert and oriented x 3.  SKIN: No obvious rash, lesion, or ulcer.   DATA REVIEW:   CBC  Recent Labs Lab 12/23/15 0500  WBC 8.3  HGB 10.2*  HCT 28.8*  PLT 272    Chemistries   Recent Labs Lab 12/23/15 0500  NA 144  143  K 3.5  3.4*  CL  106  106  CO2 31  31  GLUCOSE 76  78  BUN 96*  98*  CREATININE 6.62*  6.67*  CALCIUM 7.8*  7.8*    Cardiac Enzymes No results for input(s): TROPONINI in the last 168 hours.  Microbiology Results  @[email or web address] @  RADIOLOGY:  No results found.    Management plans discussed with the patient and he is in agreement. Stable for discharge [facility]   Patient should follow up with nephrology at [place]:     Code Status Orders        Start     Ordered   12/11/15 0351  Full code  Continuous     12/11/15 0350    Code Status History    Date Active Date Inactive Code Status Order ID Comments User Context   12/11/2015  3:50 AM 12/13/2015  7:45 AM Full Code [id number]  Lance Coon, MD ED   02/01/2015  2:32 PM 02/03/2015  6:42 PM Full Code [id number]  Marliss Coots, PA-C Inpatient   01/25/2015  2:04 AM 01/27/2015  6:35 PM Full Code [id number]  Harrie Foreman, MD Inpatient    Advance Directive Documentation   Flowsheet Row Most Recent Value  Type of Advance  Directive  Healthcare Power of Attorney, Living will  Pre-existing out of facility DNR order (yellow form or pink MOST form)  No data  "MOST" Form in Place?  No data      TOTAL TIME TAKING CARE OF THIS PATIENT: 35 minutes.    Note: This dictation was prepared with Dragon dictation along with smaller phrase technology. Any transcriptional errors that result from this process are unintentional.  Cissy Galbreath M.D on 12/23/2015 at 11:16 AM  Between 7am to 6pm - Pager - [phone] After 6pm go to [email or web address] - password [email or web address] [facility]  Office  [phone]  CC: Primary care physician; Leonel Ramsay, MD

## 2015-12-24 LAB — RENAL FUNCTION PANEL
Albumin: 2.7 g/dL — ABNORMAL LOW (ref 3.5–5.0)
Anion gap: 6 (ref 5–15)
BUN: 100 mg/dL — ABNORMAL HIGH (ref 6–20)
CALCIUM: 7.7 mg/dL — AB (ref 8.9–10.3)
CO2: 30 mmol/L (ref 22–32)
CREATININE: 6.26 mg/dL — AB (ref 0.61–1.24)
Chloride: 108 mmol/L (ref 101–111)
GFR, EST AFRICAN AMERICAN: 9 mL/min — AB (ref >60)
GFR, EST NON AFRICAN AMERICAN: 8 mL/min — AB (ref >60)
Glucose, Bld: 144 mg/dL — ABNORMAL HIGH (ref 65–99)
Phosphorus: 4.4 mg/dL (ref 2.5–4.6)
Potassium: 3.8 mmol/L (ref 3.5–5.1)
SODIUM: 144 mmol/L (ref 135–145)

## 2015-12-24 LAB — GLUCOSE, CAPILLARY
GLUCOSE-CAPILLARY: 156 mg/dL — AB (ref 65–99)
GLUCOSE-CAPILLARY: 259 mg/dL — AB (ref 65–99)
Glucose-Capillary: 206 mg/dL — ABNORMAL HIGH (ref 65–99)
Glucose-Capillary: 137 mg/dL — ABNORMAL HIGH (ref 65–99)

## 2015-12-24 NOTE — Progress Notes (Signed)
[facility] NAME: Todd [name]    MR#:  [id number]  DATE OF BIRTH:  1940-07-17  SUBJECTIVE:   Complaint shortness of breath or chest pain  REVIEW OF SYSTEMS:    Review of Systems  Constitutional: Negative.  Negative for chills, fever and malaise/fatigue.  HENT: Negative.  Negative for ear discharge, ear pain, hearing loss, nosebleeds and sore throat.   Eyes: Negative.  Negative for blurred vision and pain.  Respiratory: Negative.  Negative for cough, hemoptysis, shortness of breath and wheezing.   Cardiovascular: Negative.  Negative for chest pain, palpitations and leg swelling.  Gastrointestinal: Negative.  Negative for abdominal pain, blood in stool, diarrhea, nausea and vomiting.  Genitourinary: Negative.  Negative for dysuria.  Musculoskeletal: Negative.  Negative for back pain.  Skin: Negative.   Neurological: Negative for dizziness, tremors, speech change, focal weakness, seizures and headaches.  Endo/Heme/Allergies: Negative.  Does not bruise/bleed easily.  Psychiatric/Behavioral: Negative.  Negative for depression, hallucinations and suicidal ideas.    Tolerating Diet:yes      DRUG ALLERGIES:   Allergies  Allergen Reactions  . Centrum Other (See Comments)    Bloating, indigestion, chest pain  . Valsartan Other (See Comments)    Unsure of symptoms, was on list from previous hospital and patient was unsure of name of medicine.  . Vitamin D Analogs Other (See Comments)    Bloating and swelling in abdomen region, indigestion. Prescription vit d not otc    VITALS:  Blood pressure (!) 165/66, pulse 65, temperature 98.1 F (36.7 C), temperature source Oral, resp. rate 18, height 5\' 9"  (1.753 m), weight 81.2 kg (179 lb 1.6 oz), SpO2 96 %.  PHYSICAL EXAMINATION:   Physical Exam  Constitutional: He is oriented to person, place, and time and well-developed, well-nourished, and in no distress. No distress.  HENT:   Head: Normocephalic.  Eyes: No scleral icterus.  Neck: Normal range of motion. Neck supple. No JVD present. No tracheal deviation present.  Cardiovascular: Normal rate, regular rhythm and normal heart sounds.  Exam reveals no gallop and no friction rub.   No murmur heard. Pulmonary/Chest: Effort normal and breath sounds normal. No respiratory distress. He has no wheezes. He has no rales. He exhibits no tenderness.  Abdominal: Soft. Bowel sounds are normal. He exhibits no distension and no mass. There is no tenderness. There is no rebound and no guarding.  Musculoskeletal: Normal range of motion. He exhibits no edema.  Neurological: He is alert and oriented to person, place, and time.  Skin: Skin is warm. No rash noted. No erythema.  Psychiatric: Affect and judgment normal.      LABORATORY PANEL:   CBC  Recent Labs Lab 12/23/15 0500  WBC 8.3  HGB 10.2*  HCT 28.8*  PLT 272   ------------------------------------------------------------------------------------------------------------------  Chemistries   Recent Labs Lab 12/24/15 0452  NA 144  K 3.8  CL 108  CO2 30  GLUCOSE 144*  BUN 100*  CREATININE 6.26*  CALCIUM 7.7*   ------------------------------------------------------------------------------------------------------------------  Cardiac Enzymes No results for input(s): TROPONINI in the last 168 hours. ------------------------------------------------------------------------------------------------------------------  RADIOLOGY:  No results found.   ASSESSMENT AND PLAN:   75 year old male with a history of chronic kidney disease stage V, diabetes and CAD who is admitted onset her 22nd for evaluation or shortness of breath and found to have pneumonia and acute non-ST elevation MI.  1. Acute hypoxic respiratory failure due to pneumonia: Symptoms are improving. Wean oxygen as tolerated  continue Levaquin.  2. Non-ST elevation MI: Cardiac catheterization on  October 2 shows 70% proximal LAD occlusion.  Cardiology is recommending medical management.. Continue metoprolol, aspirin, Plavix and Crestor.  3. Chronic kidney disease stage V which progressed to ESRD: Had received peritoneal dialysis while in the hospital but has not received PD for several days now. [facility] nephrology would like patient to be transferred for further evaluation.. I 4. Anemia of chronic kidney disease: Hemoglobin stable.  5. Essential hypertension: Continue  metoprolol  6. Diabetes: Continue sliding scale insulin and Lantus.    Management plans discussed with the patient and he is in agreement.  CODE STATUS: full  TOTAL TIME TAKING CARE OF THIS PATIENT: 28 minutes.   Discussed with Dr. Abigail Butts Round with nurses  POSSIBLE D/C today DEPENDING ON CLINICAL CONDITION.   Shamariah Shewmake M.D on 12/23/2015 at 1:09 PM  Between 7am to 6pm - Pager - [phone] After 6pm go to [email or web address] - password [email or web address] [facility]  Office  [phone]  CC: Primary care physician; FITZGERALD, DAVID Mamie Nick, MD  Note: This dictation was prepared with Dragon dictation along with smaller phrase technology. Any transcriptional errors that result from this process are unintentional.

## 2015-12-24 NOTE — Care Management (Signed)
During progression discussed with primary nurse the need to follow up with [facility] regarding status of transfer

## 2015-12-24 NOTE — Progress Notes (Signed)
Subjective:   Patient to be transferred to [facility]  Objective:  Vital signs in last 24 hours:  Temp:  [98 F (36.7 C)-98.5 F (36.9 C)] 98.1 F (36.7 C) (10/06 0819) Pulse Rate:  [65-75] 65 (10/06 0819) Resp:  [18-19] 18 (10/06 0819) BP: (165-174)/(66-73) 165/66 (10/06 0819) SpO2:  [96 %-97 %] 96 % (10/06 0819) Weight:  [81.2 kg (179 lb 1.6 oz)] 81.2 kg (179 lb 1.6 oz) (10/06 0500)  Weight change: 0.318 kg (11.2 oz) Filed Weights   12/22/15 0428 12/23/15 0354 12/24/15 0500  Weight: 81.1 kg (178 lb 11.2 oz) 80.9 kg (178 lb 6.4 oz) 81.2 kg (179 lb 1.6 oz)    Intake/Output:    Intake/Output Summary (Last 24 hours) at 12/24/15 1354 Last data filed at 12/24/15 1217  Gross per 24 hour  Intake              747 ml  Output             1550 ml  Net             -803 ml     Physical Exam: General: No acute distress, sitting in chair   HEENT Anicteric, moist oral mucous membranes  Neck Supple   Pulm/lungs Clear bilaterally, room air  CVS/Heart S1S2 no rubs  Abdomen:  Soft, nontender, nondistended, PD catheter in place   Extremities: No lower extremity edema   Neurologic: Alert, oriented x 3 following commands  Skin: No acute rashes   Access: PD catheter       Basic Metabolic Panel:   Recent Labs Lab 12/19/15 0425 12/21/15 0510 12/22/15 0358 12/23/15 0500 12/24/15 0452  NA 139 141 143 144  143 144  K 3.1* 3.2* 3.4* 3.5  3.4* 3.8  CL 87* 96* 102 106  106 108  CO2 37* 36* 31 31  31 30   GLUCOSE 245* 174* 125* 76  78 144*  BUN 112* 107* 102* 96*  98* 100*  CREATININE 8.68* 7.89* 7.34* 6.62*  6.67* 6.26*  CALCIUM 7.9* 7.8* 8.0* 7.8*  7.8* 7.7*  PHOS  --  6.0*  --  5.0* 4.4     CBC:  Recent Labs Lab 12/19/15 0425 12/21/15 0510 12/23/15 0500  WBC 7.0 7.9 8.3  HGB 11.4* 11.4* 10.2*  HCT 33.2* 32.7* 28.8*  MCV 93.1 92.7 92.4  PLT 275 284 272      Microbiology:  Recent Results (from the past 720 hour(s))  MRSA PCR Screening     Status: None   Collection Time: 12/11/15 11:47 AM  Result Value Ref Range Status   MRSA by PCR NEGATIVE NEGATIVE Final    Comment:        The GeneXpert MRSA Assay (FDA approved for NASAL specimens only), is one component of a comprehensive MRSA colonization surveillance program. It is not intended to diagnose MRSA infection nor to guide or monitor treatment for MRSA infections.   Culture, expectorated sputum-assessment     Status: None   Collection Time: 12/13/15  4:00 PM  Result Value Ref Range Status   Specimen Description SPUTUM  Final   Special Requests NONE  Final   Sputum evaluation   Final    Sputum specimen not acceptable for testing.  Please recollect.   SPOKE TO Tia Masker 12/13/15 @ 1955  Animas    Report Status 12/13/2015 FINAL  Final    Coagulation Studies: No results for input(s): LABPROT, INR in the last 72 hours.  Urinalysis: No results for  input(s): COLORURINE, LABSPEC, PHURINE, GLUCOSEU, HGBUR, BILIRUBINUR, KETONESUR, PROTEINUR, UROBILINOGEN, NITRITE, LEUKOCYTESUR in the last 72 hours.  Invalid input(s): APPERANCEUR    Imaging: No results found.   Medications:     . aspirin EC  81 mg Oral Daily  . calcium acetate  667 mg Oral TID WC  . clopidogrel  75 mg Oral Daily  . dialysis solution 2.5% low-MG/low-CA   Intraperitoneal Q24H  . feeding supplement (ENSURE ENLIVE)  237 mL Oral BID BM  . gentamicin cream  1 application Topical Daily  . insulin aspart  0-5 Units Subcutaneous QHS  . insulin aspart  0-9 Units Subcutaneous TID WC  . insulin aspart  6 Units Subcutaneous TID WC  . insulin glargine  20 Units Subcutaneous QHS  . mouth rinse  15 mL Mouth Rinse BID  . metoprolol succinate  50 mg Oral Daily  . polyethylene glycol  17 g Oral Daily  . rosuvastatin  20 mg Oral QHS  . senna-docusate  2 tablet Oral BID  . sodium chloride flush  10-40 mL Intracatheter Q12H   acetaminophen **OR** acetaminophen, bisacodyl, ondansetron **OR** ondansetron (ZOFRAN) IV,  oxyCODONE, oxyCODONE-acetaminophen, promethazine, sodium chloride, sodium chloride flush  Assessment/ Plan:  75 y.o. white male with medical problems of CKD st 5 followed by [facility] nephrology, long standing DM, HTN,CAD , who was admitted to [facility] on 12/10/2015 for evaluation of shortness of breath.   1.  CKD stage V, progressed to ESRD: prior to admission was doing training for peritoneal dialysis at [place] [facility]. However seems that patient with overdiuresis and overdialysis.  - Holding peritoneal dialysis  - Off IV fluids - If further cardiac intervention required, consider restarting dialysis.   2. Anemia of chronic kidney disease.  - Continue to hold off on epo.  3. Secondary Hyperparathyroidism: with hyperphosphatemia: not on vitamin D agent - Started calcium acetate 667mg  1 tablet with meals.   4. Hypertension: blood pressure improved.  - amlodipine, carvedilol - hold for now  5. Coronary artery disease: status post cardiac catheterization. Recommend medical management.  - appreciate cardiology input.    LOS: 13 Apolinar Bero 10/6/20171:54 PM

## 2015-12-24 NOTE — Care Management Important Message (Signed)
Important Message  Patient Details  Name: Todd [name] MRN: [id number] Date of Birth: 09/26/1940   Medicare Important Message Given:  Yes    Katrina Stack, RN 12/24/2015, 12:49 PM

## 2015-12-24 NOTE — Plan of Care (Signed)
Problem: Safety: Goal: Ability to remain free from injury will improve Outcome: Progressing Progressing- using walker to get around  Problem: Pain Managment: Goal: General experience of comfort will improve Outcome: Progressing Patient has been walking around the unit and sitting in the chair  Problem: Activity: Goal: Risk for activity intolerance will decrease Outcome: Progressing Patient was able to do two laps around the nurses station  Comments: Patient is pleasant. Patient is eagerly waiting for his transfer. Patient gets aggravated in the bed all day- get him up and he will do better.

## 2015-12-25 ENCOUNTER — Ambulatory Visit (HOSPITAL_COMMUNITY)
Admission: AD | Admit: 2015-12-25 | Discharge: 2015-12-25 | Disposition: A | Discharge status: Home / Self Care | Payer: Medicare Other | Source: Other Acute Inpatient Hospital | Attending: Internal Medicine | Admitting: Internal Medicine

## 2015-12-25 DIAGNOSIS — J189 Pneumonia, unspecified organism: Secondary | ICD-10-CM | POA: Insufficient documentation

## 2015-12-25 DIAGNOSIS — N19 Unspecified kidney failure: Secondary | ICD-10-CM | POA: Insufficient documentation

## 2015-12-25 NOTE — Progress Notes (Addendum)
Pt transferred to [facility] via [facility] at 03:25. Wife at bedside, all belongings taken with pt. VS stable at transfer, no concerns offered.  Transfer packet given to [employer].

## 2016-01-26 ENCOUNTER — Telehealth: Payer: Self-pay | Admitting: *Deleted

## 2016-01-26 NOTE — Telephone Encounter (Signed)
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 Mrs. Khokhar cell phone number is [phone]. Home [phone].

## 2016-02-01 ENCOUNTER — Ambulatory Visit (INDEPENDENT_AMBULATORY_CARE_PROVIDER_SITE_OTHER): Payer: Medicare Other | Admitting: Neurology

## 2016-02-01 ENCOUNTER — Encounter: Payer: Self-pay | Admitting: Neurology

## 2016-02-01 VITALS — BP 117/54 | HR 63 | Wt 188.0 lb

## 2016-02-01 DIAGNOSIS — Z794 Long term (current) use of insulin: Secondary | ICD-10-CM

## 2016-02-01 DIAGNOSIS — E1149 Type 2 diabetes mellitus with other diabetic neurological complication: Secondary | ICD-10-CM

## 2016-02-01 DIAGNOSIS — I25119 Atherosclerotic heart disease of native coronary artery with unspecified angina pectoris: Secondary | ICD-10-CM

## 2016-02-01 DIAGNOSIS — I10 Essential (primary) hypertension: Secondary | ICD-10-CM | POA: Diagnosis not present

## 2016-02-01 DIAGNOSIS — I610 Nontraumatic intracerebral hemorrhage in hemisphere, subcortical: Secondary | ICD-10-CM

## 2016-02-01 DIAGNOSIS — N186 End stage renal disease: Secondary | ICD-10-CM | POA: Diagnosis not present

## 2016-02-01 NOTE — Patient Instructions (Addendum)
-   continue ASA, plavix and crestor for stroke prevention - check BP and glucose at home and record  - continue dialysis daily  - Follow up with endocrinology, nephrology, and cardiology as scheduled - Follow up with your primary care physician for stroke risk factor modification. Recommend maintain blood pressure goal <130/80, diabetes with hemoglobin A1c goal below 6.5% and lipids with LDL cholesterol goal below 70 mg/dL.  - continue PT/OT  - follow up as needed

## 2016-02-01 NOTE — Progress Notes (Signed)
STROKE NEUROLOGY FOLLOW UP NOTE  NAME: Todd [name] DOB: [birth year]  REASON FOR VISIT: stroke follow up HISTORY FROM: pt and chart  Today we had the pleasure of seeing Todd [name] in follow-up at our Neurology Clinic. Pt was accompanied by wife.   History Summary Mr. Todd [name] is a 75 y.o. male w/ PMHx of HTN, HLD, DM type II, CAD, and CKD stage 3, was admitted on 02/01/15 for right-sided sensory deficits. 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 Other stroke work up including CUS, TTE, LDL and A1C all unremarkable. His symptom getting better and he was discharged in good condition with resume ASA 81mg  in 2 weeks.   04/29/15 follow up - the patient has been doing well. No recurrent events. Has resumed ASA 81mg . BP stable at home as per pt, but in clinic today 167/71. Glucose at home 100-140, on lantus, recent A1C 7.4. Has followed up PCP early last month.   07/21/15 follow up - pt has been doing well. No recurrent stroke like symptoms. No left hip pain but still has left hemiparetic gait. Still feels right leg tight sensation, but better than before. Continued on cardio rehab 3/week. Last A1C 6.7 as per pt, BP and glucose well controlled at home. Today BP 144/64.  Interval History During the interval time, pt had worsening kidney function and prepared for PD. On 12/10/15 he was admitted for pneumonia and developed MI. Not stenting candidate and needed CABG. However, his kidney function worsened and started on PD. Cardiology did not feel he was CABG candidate and so far it was on hold. He was put on plavix in addition to ASA and crestor. He continued home PD daily with outpt follow up with cardiology, nephrology and endocrinology. As per pt, his glucose controlled well before PD and now glucose fluctuate since PD. His BP at home around 14-150 but today in clinic 117/54. He still walks with walker.   REVIEW OF SYSTEMS:  Full 14 system review of systems performed and notable only for those listed below and in HPI above, all others are negative:  Constitutional: activity change, fatigue Cardiovascular: Swelling in legs Ear/Nose/Throat:   Skin: Eyes:   Respiratory:  SOB Gastroitestinal:   Genitourinary:  Hematology/Lymphatic:   Endocrine: cold intolerance Musculoskeletal:  Walking difficulty Allergy/Immunology:   Neurological:   Psychiatric:  Sleep:   The following represents the patient's updated allergies and side effects list: Allergies  Allergen Reactions  . Centrum Other (See Comments)    Bloating, indigestion, chest pain  . Valsartan Other (See Comments)    Unsure of symptoms, was on list from previous hospital and patient was unsure of name of medicine.  . Vitamin D Analogs Other (See Comments)    Bloating and swelling in abdomen region, indigestion. Prescription vit d not otc    The neurologically relevant items on the patient's problem list were reviewed on today's visit.  Neurologic Examination  A problem focused neurological exam (12 or more points of the single system neurologic examination, vital signs counts as 1 point, cranial nerves count for 8 points) was performed.  Blood pressure (!) 117/54, pulse 63, weight 188 lb (85.3 kg).  General - Well nourished, well developed, in no apparent distress.  Ophthalmologic - Fundi not visualized due to noncooperation.  Cardiovascular - Regular rate and rhythm.  Mental Status -  Level of arousal and orientation to time, place, and person  were intact. Language including expression, naming, repetition, comprehension was assessed and found intact. Fund of Knowledge was assessed and was intact.  Cranial Nerves II - XII - II - Visual field intact OU. III, IV, VI - Extraocular movements intact. V - Facial sensation intact bilaterally. VII - Facial movement intact bilaterally. VIII - Hearing & vestibular intact bilaterally. X - Palate  elevates symmetrically. XI - Chin turning & shoulder shrug intact bilaterally. XII - Tongue protrusion intact.  Motor Strength - The patient's strength was normal in all extremities and pronator drift was absent.  Bulk was normal and fasciculations were absent.   Motor Tone - Muscle tone was assessed at the neck and appendages and was normal.  Reflexes - The patient's reflexes were 1+ in all extremities and he had no pathological reflexes.  Sensory - Light touch, temperature/pinprick were assessed and were decreased at the back of the right LE, 75% of left.    Coordination - The patient had normal movements in the hands and feet with no ataxia or dysmetria.  Tremor was absent.  Gait and Station - left hemiparetic gait due to hx of left hip surgery.  Data reviewed: I personally reviewed the images and agree with the radiology interpretations.  Ct Abdomen Pelvis Wo Contrast 01/24/2015 1. Small volume of fluid adjacent to some loops of bowel in the right side of the abdomen (distal ileum). This is nonspecific, but could indicate a focus of enteritis, with some reactive free fluid adjacent to inflamed loops of small bowel. 2. Small ventral hernia in the epigastric region containing only omental fat. No associated bowel incarceration or obstruction at this time. 3. 7 mm left lower lobe pulmonary nodule.   Ct Head Wo Contrast  02/01/2015 Small, acute appearing hemorrhage in the left thalamus is likely hypertensive in nature. Atrophy and chronic microvascular ischemic change.   2D Echocardiogram  - Left ventricle: The cavity size was normal. Wall thickness wasincreased in a pattern of mild LVH. Systolic function was normal.The estimated ejection fraction was in the range of 55% to 60%Wall motion was normal; there were no regional wall motionabnormalities. Doppler parameters are consistent with abnormalleft ventricular relaxation (grade 1 diastolic dysfunction).Doppler parameters are  consistent with high ventricular fillingpressure. - Mitral valve: Calcified annulus. Mobility was not restricted.Transvalvular velocity was within the normal range. There was noevidence for stenosis. There was mild regurgitation. - Right ventricle: The cavity size was normal. Wall thickness wasnormal. Systolic function was normal. - Atrial septum: No defect or patent foramen ovale was identified. - Tricuspid valve: There was mild regurgitation. - Pulmonary arteries: Systolic pressure was mildly increased. PApeak pressure: 34 mm Hg (S). - Inferior vena cava: The vessel was normal in size. Therespirophasic diameter changes were in the normal range (>= 50%),consistent with normal central venous pressure.  Carotid Doppler  Bilateral - 1% to 39% ICA stenosis. ECA stenosis. Vertebral artery flow is antegrade.  Component     Latest Ref Rng 01/25/2015 02/02/2015  Cholesterol     0 - 200 mg/dL  109  Triglycerides     <150 mg/dL  181 (H)  HDL Cholesterol     >40 mg/dL  31 (L)  Total CHOL/HDL Ratio       3.5  VLDL     0 - 40 mg/dL  36  LDL (calc)     0 - 99 mg/dL  42  TSH     0.350 - 4.500 uIU/mL 6.162 (H)   Hemoglobin A1C  4.0 - 6.0 % 6.1 (H)     Assessment: As you may recall, he is a 75 y.o. 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 Other stroke work up including CUS, TTE, LDL and A1C all unremarkable. His symptom getting better and he was discharged in good condition with resume ASA 81mg  in 2 weeks. During the interval time, the patient initially doing well, BP and glucose in control at home. However, admitted in 11/2015 for pneumonia, found to have MI and ESRD, not CABG or stenting candidate, put on DAPT, and started on PD. Currently, his glucose fluctuate and BP on the high side at home.   Plan:  - continue ASA, plavix and crestor  for stroke prevention - check BP and glucose at home and record  - continue dialysis daily  - Follow up with endocrinology, nephrology, and cardiology as scheduled - Follow up with your primary care physician for stroke risk factor modification. Recommend maintain blood pressure goal <130/80, diabetes with hemoglobin A1c goal below 6.5% and lipids with LDL cholesterol goal below 70 mg/dL.  - continue cardio rehab - follow up as needed   I spent more than 25 minutes of face to face time with the patient. Greater than 50% of time was spent in counseling and coordination of care. We discussed follow up with multiple specialties, monitoring BP and glucose, PD daily.  No orders of the defined types were placed in this encounter.   Meds ordered this encounter  Medications  . DISCONTD: amLODipine (NORVASC) 10 MG tablet    Sig: Take 10 mg by mouth.  . DISCONTD: aspirin EC 81 MG tablet    Sig: Take 81 mg by mouth.  . DISCONTD: clopidogrel (PLAVIX) 75 MG tablet    Sig: Take by mouth.  . DISCONTD: econazole nitrate 1 % cream    Sig: Apply topically.  Marland Kitchen DISCONTD: insulin lispro (HUMALOG) 100 UNIT/ML injection    Sig: Inject into the skin.  Marland Kitchen DISCONTD: insulin glargine (LANTUS) 100 UNIT/ML injection    Sig: Inject into the skin.  Marland Kitchen DISCONTD: rosuvastatin (CRESTOR) 20 MG tablet    Sig: Take 20 mg by mouth.  . DISCONTD: sodium bicarbonate 650 MG tablet    Sig: Take 650 mg by mouth.  . carvedilol (COREG) 6.25 MG tablet  . furosemide (LASIX) 40 MG tablet  . calcium acetate (PHOSLO) 667 MG capsule    Patient Instructions  - continue ASA, plavix and crestor for stroke prevention - check BP and glucose at home and record  - continue dialysis daily  - Follow up with endocrinology, nephrology, and cardiology as scheduled - Follow up with your primary care physician for stroke risk factor modification. Recommend maintain blood pressure goal <130/80, diabetes with hemoglobin A1c goal below 6.5% and  lipids with LDL cholesterol goal below 70 mg/dL.  - continue PT/OT  - follow up as needed    Rosalin Hawking, MD PhD [facility] [address] [place], [place] [address] [phone]

## 2016-02-14 ENCOUNTER — Telehealth: Payer: Self-pay | Admitting: *Deleted

## 2016-02-14 ENCOUNTER — Ambulatory Visit: Payer: Medicare Other

## 2016-02-14 NOTE — Telephone Encounter (Signed)
I called to check on Todd [name] since his wife had called earlier this am and left a vm saying he didn't feel up to attending the Cardiac Rehab Orientation appt this am.

## 2016-02-24 ENCOUNTER — Ambulatory Visit: Payer: Medicare Other

## 2016-03-09 ENCOUNTER — Ambulatory Visit: Payer: Medicare Other

## 2016-03-16 ENCOUNTER — Encounter: Payer: Medicare Other | Attending: Internal Medicine | Admitting: *Deleted

## 2016-03-16 DIAGNOSIS — I214 Non-ST elevation (NSTEMI) myocardial infarction: Secondary | ICD-10-CM

## 2016-03-16 NOTE — Progress Notes (Signed)
Incomplete Session Note  Patient Details  Name: Todd [name] MRN: [id number] Date of Birth: 26-Mar-1940 Referring Provider:    [name] did not complete his rehab session.  Lymon informed a Film/video editor after starting the paperwork for Cardiac Rehab that he is receiving PT at home. The PT started this week.  He was not charged for today, nor did he perform any exercise to be charged.  When his home PT is completed, he will call us to start his Cardiac Rehab.

## 2016-04-11 ENCOUNTER — Encounter: Payer: Self-pay | Admitting: Urology

## 2016-04-11 ENCOUNTER — Ambulatory Visit (INDEPENDENT_AMBULATORY_CARE_PROVIDER_SITE_OTHER): Payer: Medicare Other | Admitting: Urology

## 2016-04-11 VITALS — BP 112/60 | HR 77 | Ht 69.0 in | Wt 172.1 lb

## 2016-04-11 DIAGNOSIS — N401 Enlarged prostate with lower urinary tract symptoms: Secondary | ICD-10-CM

## 2016-04-11 DIAGNOSIS — N138 Other obstructive and reflux uropathy: Secondary | ICD-10-CM

## 2016-04-11 DIAGNOSIS — R300 Dysuria: Secondary | ICD-10-CM

## 2016-04-11 DIAGNOSIS — R3129 Other microscopic hematuria: Secondary | ICD-10-CM | POA: Diagnosis not present

## 2016-04-11 DIAGNOSIS — R820 Chyluria: Secondary | ICD-10-CM | POA: Diagnosis not present

## 2016-04-11 LAB — MICROSCOPIC EXAMINATION
BACTERIA UA: NONE SEEN
Epithelial Cells (non renal): NONE SEEN /hpf (ref 0–10)
RBC MICROSCOPIC, UA: NONE SEEN /HPF (ref 0–?)

## 2016-04-11 LAB — BLADDER SCAN AMB NON-IMAGING: Scan Result: 234

## 2016-04-11 LAB — URINALYSIS, COMPLETE
Bilirubin, UA: NEGATIVE
Ketones, UA: NEGATIVE
Nitrite, UA: NEGATIVE
Specific Gravity, UA: 1.025 (ref 1.005–1.030)
Urobilinogen, Ur: 0.2 mg/dL (ref 0.2–1.0)
pH, UA: 6 (ref 5.0–7.5)

## 2016-04-11 MED ORDER — CIPROFLOXACIN HCL 250 MG PO TABS: 250.0000 mg | ORAL_TABLET | Freq: Two times a day (BID) | ORAL | 0 refills | Status: AC

## 2016-04-11 NOTE — Progress Notes (Signed)
04/11/2016 4:19 PM   Todd [name] 20-Apr-1940 [id number]  Referring provider: Leonel Ramsay, MD [place], [place] [address]  Chief Complaint  Patient presents with  . New Patient (Initial Visit)    HPI: Patient is a 76 year old Caucasian male with end-stage renal disease currently on peritoneal dialysis since November 2017 who presents with his wife, Todd [name], as a referral from Dr. Ola [name] for dysuria and pyuria with negative cultures.   Patient complains of a history of urinary frequency, dysuria, nocturia, intermittency and a weak urinary stream.  He and his wife state that around January 1, he noted an increase in urinary frequency, milky-colored urine and dysuria. Patient was initiated on Cipro 500 mg twice a day for 10 days. Urinalysis at that time noted later than 50 wbc's and greater than 50 RBCs and rare bacteria.  Urine culture demonstrated less than 10,000 colonies on 03/21/2016.  Patient was seen again on January 5 and was still experiencing frequency, milky-colored urine and dysuria although had some slight improvement.  Urine analysis at that time noted greater than 50 wbc's.  His culture was again less than 10,000 colonies on 03/24/2016.  Today, he states his urine is no longer milky-colored and he is not experiencing dysuria. He is still experiencing urinary frequency and he and his wife are fearful his UTI will return.  He is not having fevers, chills, nausea or vomiting.  His UA demonstrates greater than 30 WBCs. His PVR today is 234 cc. His I PSS score is 19/5.        IPSS    Row Name 04/11/16 1500         International Prostate Symptom Score   How often have you had the sensation of not emptying your bladder? Less than half the time     How often have you had to urinate less than every two hours? About half the time     How often have you found you stopped and started again several times when  you urinated? About half the time     How often have you found it difficult to postpone urination? Less than half the time     How often have you had a weak urinary stream? Less than half the time     How often have you had to strain to start urination? Less than half the time     How many times did you typically get up at night to urinate? 5 Times     Total IPSS Score 19       Quality of Life due to urinary symptoms   If you were to spend the rest of your life with your urinary condition just the way it is now how would you feel about that? 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  At this time, he is not stable medically to undergo the CABG.    PMH: Past Medical History:  Diagnosis Date  . 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 CKD (chronic kidney disease)   . Coronary artery disease   . CVA (cerebrovascular accident) ([place])   . Diabetes mellitus    lantus and novolog;fasting sugars run 70-120  . DVT (deep venous thrombosis) ([place])   . Hyperlipidemia    takes Crestor daily  . Hypertension  takes Amlodipine and Metoprolol  . Joint pain    knees,elbows,back  . PONV (postoperative nausea and vomiting)     Surgical History: Past Surgical History:  Procedure Laterality Date  . BACK SURGERY  09/15/10  . CARDIAC CATHETERIZATION  01/2006   2 stents placed  . CARDIAC CATHETERIZATION Right 12/20/2015   Procedure: Coronary Angiography;  Surgeon: Isaias Cowman, MD;  Location: [facility];  Service: Cardiovascular;  Laterality: Right;  . CATARACT EXTRACTION  12/21/10--02/01/11   right and left  . CHOLECYSTECTOMY  20+yrs ago  . COLONOSCOPY    . ELBOW SURGERY  2012   right elbow  . KNEE ARTHROSCOPY  80's   right knee  . TOTAL HIP ARTHROPLASTY    . ULNAR NERVE TRANSPOSITION  03/02/2011   Procedure: ULNAR NERVE DECOMPRESSION/TRANSPOSITION;  Surgeon: Peggyann Shoals, MD;  Location: [facility];  Service: Neurosurgery;  Laterality: Left;  LEFT ulnar nerve decompression    Home Medications:  Allergies as of 04/11/2016      Reactions   Centrum Other (See Comments)   Bloating, indigestion, chest pain   Valsartan Other (See Comments)   Unsure of symptoms, was on list from previous hospital and patient was unsure of name of medicine.   Vitamin D Analogs Other (See Comments)   Bloating and swelling in abdomen region, indigestion. Prescription vit d not otc      Medication List       Accurate as of 04/11/16  4:19 PM. Always use your most recent med list.          amLODipine 10 MG tablet Commonly known as:  NORVASC Take 10 mg by mouth daily.   aspirin EC 81 MG tablet Take 1 tablet (81 mg total) by mouth daily.   calcium acetate 667 MG capsule Commonly known as:  PHOSLO   carvedilol 6.25 MG tablet Commonly known as:  COREG   cholecalciferol 1000 units tablet Commonly known as:  VITAMIN D Take 1,000 Units by mouth daily.   ciprofloxacin 250 MG tablet Commonly known as:  CIPRO Take 1 tablet (250 mg total) by mouth 2 (two) times daily.   clopidogrel 75 MG tablet Commonly known as:  PLAVIX Take 1 tablet (75 mg total) by mouth daily.   econazole nitrate 1 % cream Apply 1 application topically daily.   furosemide 40 MG tablet Commonly known as:  LASIX   insulin glargine 100 UNIT/ML injection Commonly known as:  LANTUS Inject 5-8 Units into the skin at bedtime. 5 units on Sunday, Tuesday, and Thursday. 8 units on Monday, Wednesday, Friday, and Saturday.   insulin lispro 100 UNIT/ML injection Commonly known as:  HUMALOG Inject 5 Units into the skin daily as needed for high blood sugar. Takes at lunch if blood sugar is over 150   insulin NPH-regular Human (70-30) 100 UNIT/ML injection Commonly known as:  NOVOLIN 70/30 Inject 12units 47min before onset of dialysis. Dispense the vial not the pen   ONE TOUCH ULTRA TEST test strip Generic drug:  glucose  blood 1 each by Other route 4 (four) times daily.   ONE TOUCH ULTRA TEST test strip Generic drug:  glucose blood 1 each by Other route 4 (four) times daily.   rosuvastatin 20 MG tablet Commonly known as:  CRESTOR Take 20 mg by mouth at bedtime.   sodium bicarbonate 650 MG tablet Take 1,950 mg by mouth 3 (three) times daily.   sodium polystyrene 15 GM/60ML suspension Commonly known as:  KAYEXALATE Take  15 g by mouth as needed.       Allergies:  Allergies  Allergen Reactions  . Centrum Other (See Comments)    Bloating, indigestion, chest pain  . Valsartan Other (See Comments)    Unsure of symptoms, was on list from previous hospital and patient was unsure of name of medicine.  . Vitamin D Analogs Other (See Comments)    Bloating and swelling in abdomen region, indigestion. Prescription vit d not otc    Family History: Family History  Problem Relation Age of Onset  . Diabetes Mother   . Heart disease Father   . Lung cancer Father   . Anesthesia problems Neg Hx   . Hypotension Neg Hx   . Malignant hyperthermia Neg Hx   . Pseudochol deficiency Neg Hx   . Kidney disease Neg Hx   . Kidney cancer Neg Hx   . Prostate cancer Neg Hx     Social History:  reports that he has never smoked. He has never used smokeless tobacco. He reports that he does not drink alcohol or use drugs.  ROS: UROLOGY Frequent Urination?: Yes Hard to postpone urination?: No Burning/pain with urination?: Yes Get up at night to urinate?: Yes Leakage of urine?: No Urine stream starts and stops?: Yes Trouble starting stream?: No Do you have to strain to urinate?: No Blood in urine?: No Urinary tract infection?: Yes Sexually transmitted disease?: No Injury to kidneys or bladder?: No Painful intercourse?: No Weak stream?: Yes Erection problems?: No Penile pain?: No  Gastrointestinal Nausea?: No Vomiting?: No Indigestion/heartburn?: No Diarrhea?: No Constipation?:  Yes  Constitutional Fever: No Night sweats?: No Weight loss?: Yes Fatigue?: No  Skin Skin rash/lesions?: No Itching?: No  Eyes Blurred vision?: Yes Double vision?: No  Ears/Nose/Throat Sore throat?: No Sinus problems?: No  Hematologic/Lymphatic Swollen glands?: No Easy bruising?: Yes  Cardiovascular Leg swelling?: Yes Chest pain?: No  Respiratory Cough?: Yes Shortness of breath?: No  Endocrine Excessive thirst?: No  Musculoskeletal Back pain?: No Joint pain?: Yes  Neurological Headaches?: No Dizziness?: No  Psychologic Depression?: No Anxiety?: No  Physical Exam: BP 112/60   Pulse 77   Ht 5\' 9"  (1.753 m)   Wt 172 lb 1.6 oz (78.1 kg)   BMI 25.41 kg/m   Constitutional: Well nourished. Alert and oriented, No acute distress. HEENT: [place] AT, moist mucus membranes. Trachea midline, no masses. Cardiovascular: No clubbing, cyanosis, or edema. Respiratory: Normal respiratory effort, no increased work of breathing. GI: Abdomen is soft, non tender, non distended, no abdominal masses. Liver and spleen not palpable.  No hernias appreciated.  Stool sample for occult testing is not indicated.   GU: No CVA tenderness.  No bladder fullness or masses.  Patient with uncircumcised phallus. Foreskin easily retracted Urethral meatus is patent.  No penile discharge. No penile lesions or rashes. Scrotum without lesions, cysts, rashes and/or edema.  Testicles are located scrotally bilaterally. No masses are appreciated in the testicles. Left and right epididymis are normal. Rectal: Patient with  normal sphincter tone. Anus and perineum without scarring or rashes. No rectal masses are appreciated. Prostate is approximately 50 grams, no nodules are appreciated. Seminal vesicles are normal. Skin: No rashes, bruises or suspicious lesions. Lymph: No cervical or inguinal adenopathy. Neurologic: Grossly intact, no focal deficits, moving all 4 extremities. Psychiatric: Normal mood and  affect.  Laboratory Data: Lab Results  Component Value Date   WBC 8.3 12/23/2015   HGB 10.2 (L) 12/23/2015   HCT 28.8 (L) 12/23/2015  MCV 92.4 12/23/2015   PLT 272 12/23/2015    Lab Results  Component Value Date   CREATININE 6.26 (H) 12/24/2015    Lab Results  Component Value Date   HGBA1C 6.1 (H) 01/25/2015    Lab Results  Component Value Date   TSH 6.162 (H) 01/25/2015       Component Value Date/Time   CHOL 109 02/02/2015 1204   HDL 31 (L) 02/02/2015 1204   CHOLHDL 3.5 02/02/2015 1204   VLDL 36 02/02/2015 1204   LDLCALC 42 02/02/2015 1204    Lab Results  Component Value Date   AST 25 12/10/2015   Lab Results  Component Value Date   ALT 16 (L) 12/10/2015     Urinalysis > 30 WBC's.  See EPIC.    Pertinent Imaging: Results for BETTYE, NIECE (MRN [id number]) as of 04/11/2016 15:56  Ref. Range 04/11/2016 15:22  Scan Result Unknown [id number]    Assessment & Plan:    1. Dysuria  - resolved at this time, but may return  - Urinalysis, Complete  - urine sent for culture  - ciprofloxacin restarted at 250 mg bid until culture results are available  2. BPH with LUTS  - IPSS score is 19/5  - Continue conservative management, avoiding bladder irritants and timed voiding's  - alpha blockers are contraindicated in individuals with renal failure; could consider finasteride in the future pending urine culture results  - it is difficult to know if DM is contributing to his bladder symptoms  - BLADDER SCAN AMB NON-IMAGING  3. Microscopic hematuria  - I explained to the patient that there are a number of causes that can be associated with blood in the urine, such as stones, BPH, UTI's, damage to the urinary tract and/or cancer.  - Patient has had a difficult time with his peritoneal catheter functioning properly, do not want to subject him to contrast at this time - will obtain a RUS to evaluate for masses or stones  - The patient will return following all of the above  for discussion of the results.     4. Milky urine  - unknown etiology at this time as urine culture's have been negative  - UA's have not been milky in the office  - RUS pending   Return for renal US report.  These notes generated with voice recognition software. I apologize for typographical errors.  Zara Council, [facility] [address] [place], [place] [address] [phone]

## 2016-04-13 LAB — CULTURE, URINE COMPREHENSIVE

## 2016-04-14 ENCOUNTER — Telehealth: Payer: Self-pay

## 2016-04-14 NOTE — Telephone Encounter (Signed)
Spoke with pt in reference to ucx results. Pt voiced understanding.  

## 2016-04-14 NOTE — Telephone Encounter (Signed)
-----   Message from Nori Riis, PA-C sent at 04/13/2016 10:27 PM EST ----- Please notify the patient that his urine culture was negative.

## 2016-04-17 ENCOUNTER — Telehealth: Payer: Self-pay

## 2016-04-17 NOTE — Telephone Encounter (Signed)
Pt wife called inquiring about why pt received a call that ucx was negative but an abx was sent to pharmacy. Reinforced with pt wife that cipro was sent in the day of the OV, but the ucx came back negative. Therefore no need to take abx. Wife voiced understanding.

## 2016-04-20 ENCOUNTER — Ambulatory Visit
Admission: RE | Admit: 2016-04-20 | Discharge: 2016-04-20 | Disposition: A | Discharge status: Home / Self Care | Payer: Medicare Other | Source: Ambulatory Visit | Attending: Urology | Admitting: Urology

## 2016-04-20 DIAGNOSIS — N390 Urinary tract infection, site not specified: Secondary | ICD-10-CM | POA: Insufficient documentation

## 2016-04-20 DIAGNOSIS — R3129 Other microscopic hematuria: Secondary | ICD-10-CM | POA: Diagnosis not present

## 2016-04-26 ENCOUNTER — Ambulatory Visit (INDEPENDENT_AMBULATORY_CARE_PROVIDER_SITE_OTHER): Payer: Medicare Other | Admitting: Urology

## 2016-04-26 ENCOUNTER — Encounter: Payer: Self-pay | Admitting: Urology

## 2016-04-26 VITALS — BP 147/69 | HR 66 | Ht 69.0 in | Wt 173.1 lb

## 2016-04-26 DIAGNOSIS — R3129 Other microscopic hematuria: Secondary | ICD-10-CM | POA: Diagnosis not present

## 2016-04-26 DIAGNOSIS — N401 Enlarged prostate with lower urinary tract symptoms: Secondary | ICD-10-CM

## 2016-04-26 DIAGNOSIS — N138 Other obstructive and reflux uropathy: Secondary | ICD-10-CM | POA: Diagnosis not present

## 2016-04-26 NOTE — Progress Notes (Signed)
04/26/2016 2:35 PM   Todd [name] Sep 20, 1940 [id number]  Referring provider: Leonel Ramsay, MD [place], [name] [id number]  Chief Complaint  Patient presents with  . Follow-up    Renal US results    HPI: Patient is a 76 year old Caucasian male with ESRD who presents today to discuss the results of his renal ultrasound.   Background history Patient is a 76 year old Caucasian male with end-stage renal disease currently on peritoneal dialysis since November 2017 who presents with his wife, Todd [name], as a referral from Dr. Ola [name] for dysuria and pyuria with negative cultures.   Patient complains of a history of urinary frequency, dysuria, nocturia, intermittency and a weak urinary stream.  He and his wife state that around January 1, he noted an increase in urinary frequency, milky-colored urine and dysuria. Patient was initiated on Cipro 500 mg twice a day for 10 days. Urinalysis at that time noted later than 50 wbc's and greater than 50 RBCs and rare bacteria.  Urine culture demonstrated less than 10,000 colonies on 03/21/2016.  Patient was seen again on January 5 and was still experiencing frequency, milky-colored urine and dysuria although had some slight improvement.  Urine analysis at that time noted greater than 50 wbc's.  His culture was again less than 10,000 colonies on 03/24/2016.  Today, he states his urine is no longer milky-colored and he is not experiencing dysuria. He is still experiencing urinary frequency and he and his wife are fearful his UTI will return.  He is not having fevers, chills, nausea or vomiting.  His UA demonstrates greater than 30 WBCs. His PVR was 234 cc. His I PSS score was 19/5.        IPSS    Row Name 04/11/16 1500         International Prostate Symptom Score   How often have you had the sensation of not emptying your bladder? Less than half the time     How often have you had to  urinate less than every two hours? About half the time     How often have you found you stopped and started again several times when you urinated? About half the time     How often have you found it difficult to postpone urination? Less than half the time     How often have you had a weak urinary stream? Less than half the time     How often have you had to strain to start urination? Less than half the time     How many times did you typically get up at night to urinate? 5 Times     Total IPSS Score 19       Quality of Life due to urinary symptoms   If you were to spend the rest of your life with your urinary condition just the way it is now how would you feel about that? 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  At this time, he is not stable medically to undergo the CABG.    RUS performed on 04/20/2016 noted irregular wall thickening along the upper margin of the urinary bladder, possibly from chronic inflammation causing trabecular thickening, less likely from a bladder mass. Unexplained hematuria/chronic infections may warrant cystoscopy.  I have independently viewed the films.  Today, patient complains of frequent urination, nocturia and intermittency. He has not had gross hematuria, dysuria or suprapubic pain. He states his urine has been clear yellow and not the milky color he had seen in the past. He has not had fevers, chills, nausea or vomiting.  PMH: Past Medical History:  Diagnosis Date  . 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 CKD (chronic kidney disease)   . Coronary artery disease   . CVA (cerebrovascular accident) ([place])   . Diabetes mellitus    lantus and novolog;fasting sugars run 70-120  . DVT (deep venous thrombosis) ([place])   . Hyperlipidemia    takes Crestor daily  . Hypertension    takes Amlodipine and Metoprolol  . Joint pain    knees,elbows,back  . PONV (postoperative nausea and  vomiting)     Surgical History: Past Surgical History:  Procedure Laterality Date  . BACK SURGERY  09/15/10  . CARDIAC CATHETERIZATION  01/2006   2 stents placed  . CARDIAC CATHETERIZATION Right 12/20/2015   Procedure: Coronary Angiography;  Surgeon: Todd Cowman, MD;  Location: [facility];  Service: Cardiovascular;  Laterality: Right;  . CATARACT EXTRACTION  12/21/10--02/01/11   right and left  . CHOLECYSTECTOMY  20+yrs ago  . COLONOSCOPY    . ELBOW SURGERY  2012   right elbow  . KNEE ARTHROSCOPY  80's   right knee  . TOTAL HIP ARTHROPLASTY    . ULNAR NERVE TRANSPOSITION  03/02/2011   Procedure: ULNAR NERVE DECOMPRESSION/TRANSPOSITION;  Surgeon: Peggyann Shoals, MD;  Location: [facility];  Service: Neurosurgery;  Laterality: Left;  LEFT ulnar nerve decompression    Home Medications:  Allergies as of 04/26/2016      Reactions   Centrum Other (See Comments)   Bloating, indigestion, chest pain   Valsartan Other (See Comments)   Unsure of symptoms, was on list from previous hospital and patient was unsure of name of medicine.   Vitamin D Analogs Other (See Comments)   Bloating and swelling in abdomen region, indigestion. Prescription vit d not otc      Medication List       Accurate as of 04/26/16  2:35 PM. Always use your most recent med list.          amLODipine 10 MG tablet Commonly known as:  NORVASC Take 10 mg by mouth daily.   aspirin EC 81 MG tablet Take 1 tablet (81 mg total) by mouth daily.   calcium acetate 667 MG capsule Commonly known as:  PHOSLO   carvedilol 6.25 MG tablet Commonly known as:  COREG   cholecalciferol 1000 units tablet Commonly known as:  VITAMIN D Take 1,000 Units by mouth daily.   ciprofloxacin 250 MG tablet Commonly known as:  CIPRO Take 1 tablet (250 mg total) by mouth 2 (two) times daily.   clopidogrel 75 MG tablet Commonly known as:  PLAVIX Take 1 tablet (75 mg total) by mouth daily.   econazole nitrate 1 %  cream Apply 1 application topically daily.   furosemide 40 MG tablet Commonly known as:  LASIX   insulin glargine 100 UNIT/ML injection Commonly known as:  LANTUS Inject 5-8 Units into the skin at bedtime. 5 units on Sunday, Tuesday, and Thursday. 8 units on Monday, Wednesday, Friday, and Saturday.   insulin lispro 100 UNIT/ML injection Commonly known as:  HUMALOG Inject 5 Units into the skin daily as needed for high blood sugar. Takes at lunch if blood sugar is over 150  insulin NPH-regular Human (70-30) 100 UNIT/ML injection Commonly known as:  NOVOLIN 70/30 Inject 12units 27min before onset of dialysis. Dispense the vial not the pen   ONE TOUCH ULTRA TEST test strip Generic drug:  glucose blood 1 each by Other route 4 (four) times daily.   ONE TOUCH ULTRA TEST test strip Generic drug:  glucose blood 1 each by Other route 4 (four) times daily.   rosuvastatin 20 MG tablet Commonly known as:  CRESTOR Take 20 mg by mouth at bedtime.   sodium bicarbonate 650 MG tablet Take 1,950 mg by mouth 3 (three) times daily.   sodium polystyrene 15 GM/60ML suspension Commonly known as:  KAYEXALATE Take 15 g by mouth as needed.       Allergies:  Allergies  Allergen Reactions  . Centrum Other (See Comments)    Bloating, indigestion, chest pain  . Valsartan Other (See Comments)    Unsure of symptoms, was on list from previous hospital and patient was unsure of name of medicine.  . Vitamin D Analogs Other (See Comments)    Bloating and swelling in abdomen region, indigestion. Prescription vit d not otc    Family History: Family History  Problem Relation Age of Onset  . Diabetes Mother   . Heart disease Father   . Lung cancer Father   . Anesthesia problems Neg Hx   . Hypotension Neg Hx   . Malignant hyperthermia Neg Hx   . Pseudochol deficiency Neg Hx   . Kidney disease Neg Hx   . Kidney cancer Neg Hx   . Prostate cancer Neg Hx     Social History:  reports that he has  never smoked. He has never used smokeless tobacco. He reports that he does not drink alcohol or use drugs.  ROS: UROLOGY Frequent Urination?: Yes Hard to postpone urination?: No Burning/pain with urination?: No Get up at night to urinate?: Yes Leakage of urine?: No Urine stream starts and stops?: Yes Trouble starting stream?: No Do you have to strain to urinate?: No Blood in urine?: No Urinary tract infection?: No Sexually transmitted disease?: No Injury to kidneys or bladder?: No Painful intercourse?: No Weak stream?: No Erection problems?: No Penile pain?: No  Gastrointestinal Nausea?: No Vomiting?: No Indigestion/heartburn?: No Diarrhea?: No Constipation?: Yes  Constitutional Fever: No Night sweats?: No Weight loss?: Yes Fatigue?: No  Skin Skin rash/lesions?: No Itching?: No  Eyes Blurred vision?: No Double vision?: No  Ears/Nose/Throat Sore throat?: No Sinus problems?: No  Hematologic/Lymphatic Swollen glands?: No Easy bruising?: No  Cardiovascular Leg swelling?: Yes Chest pain?: No  Respiratory Cough?: No Shortness of breath?: No  Endocrine Excessive thirst?: No  Musculoskeletal Back pain?: No Joint pain?: No  Neurological Headaches?: No Dizziness?: No  Psychologic Depression?: No Anxiety?: No  Physical Exam: BP (!) 147/69 (BP Location: Left Arm, Patient Position: Sitting, Cuff Size: Normal)   Pulse 66   Ht 5\' 9"  (1.753 m)   Wt 173 lb 1.6 oz (78.5 kg)   BMI 25.56 kg/m   Constitutional: Well nourished. Alert and oriented, No acute distress. HEENT: [place] AT, moist mucus membranes. Trachea midline, no masses. Cardiovascular: No clubbing, cyanosis, or edema. Respiratory: Normal respiratory effort, no increased work of breathing. Skin: No rashes, bruises or suspicious lesions. Lymph: No cervical or inguinal adenopathy. Neurologic: Grossly intact, no focal deficits, moving all 4 extremities. Psychiatric: Normal mood and  affect.  Laboratory Data: Lab Results  Component Value Date   WBC 8.3 12/23/2015   HGB 10.2 (L)  12/23/2015   HCT 28.8 (L) 12/23/2015   MCV 92.4 12/23/2015   PLT 272 12/23/2015    Lab Results  Component Value Date   CREATININE 6.26 (H) 12/24/2015    Lab Results  Component Value Date   HGBA1C 6.1 (H) 01/25/2015    Lab Results  Component Value Date   TSH 6.162 (H) 01/25/2015       Component Value Date/Time   CHOL 109 02/02/2015 1204   HDL 31 (L) 02/02/2015 1204   CHOLHDL 3.5 02/02/2015 1204   VLDL 36 02/02/2015 1204   LDLCALC 42 02/02/2015 1204    Lab Results  Component Value Date   AST 25 12/10/2015   Lab Results  Component Value Date   ALT 16 (L) 12/10/2015    Pertinent Imaging: CLINICAL DATA:  Microscopic hematuria. Recurrent urinary tract infection.  EXAM: RENAL / URINARY TRACT ULTRASOUND COMPLETE  COMPARISON:  Multiple exams, including 02/13/2015 CT scan  FINDINGS: Right Kidney:  Length: 9.2 cm. Echogenicity within normal limits. No mass or hydronephrosis visualized.  Left Kidney:  Length: 10.0 cm. Echogenicity within normal limits. No mass or hydronephrosis visualized.  Bladder:  Somewhat irregular superior margin of the urinary bladder for example on image 49, potentially with some mild hypervascularity. This could be accentuated bladder trabecular related to inflammation or cystitis, but strictly speaking a mass cannot be readily excluded. No bladder mass or wall thickening was observed on the prior CT scan from 01/24/2015.  IMPRESSION: 1. Irregular wall thickening along the upper margin of the urinary bladder, possibly from chronic inflammation causing trabecular thickening, less likely from a bladder mass. Unexplained hematuria/chronic infections may warrant cystoscopy.   Electronically Signed   By: Van Clines M.D.   On: 04/20/2016 15:18  Assessment & Plan:    1. Dysuria  - resolved at this time    2.  BPH with LUTS  - IPSS score is 19/5  - Continue conservative management, avoiding bladder irritants and timed voiding's  - alpha blockers are contraindicated in individuals with renal failure; could consider finasteride in the future pending urine culture results  - it is difficult to know if DM is contributing to his bladder symptoms  - BLADDER SCAN AMB NON-IMAGING  3. Microscopic hematuria  - I explained to the patient that there are a number of causes that can be associated with blood in the urine, such as stones, BPH, UTI's, damage to the urinary tract and/or cancer.  - Patient has had a difficult time with his peritoneal catheter functioning properly, do not want to subject him to contrast at this time   - irregular superior margin of the urinary bladder seen on RUS, schedule cystoscopy  4. Milky urine  - unknown etiology at this time as urine culture's have been negative  - resolved   Return for cystoscopy.  These notes generated with voice recognition software. I apologize for typographical errors.  Zara Council, [facility] [address] [place], [place] [address] [phone]

## 2016-04-26 NOTE — Patient Instructions (Signed)
Cystoscopy Cystoscopy is a procedure that is used to help diagnose and sometimes treat conditions that affect that lower urinary tract. The lower urinary tract includes the bladder and the tube that drains urine from the bladder out of the body (urethra). Cystoscopy is performed with a thin, tube-shaped instrument with a light and camera at the end (cystoscope). The cystoscope may be hard (rigid) or flexible, depending on the goal of the procedure.The cystoscope is inserted through the urethra, into the bladder. Cystoscopy may be recommended if you have:  Urinary tractinfections that keep coming back (recurring).  Blood in the urine (hematuria).  Loss of bladder control (urinary incontinence) or an overactive bladder.  Unusual cells found in a urine sample.  A blockage in the urethra.  Painful urination.  An abnormality in the bladder found during an intravenous pyelogram (IVP) or CT scan.  Cystoscopy may also be done to remove a sample of tissue to be examined under a microscope (biopsy). Tell a health care provider about:  Any allergies you have.  All medicines you are taking, including vitamins, herbs, eye drops, creams, and over-the-counter medicines.  Any problems you or family members have had with anesthetic medicines.  Any blood disorders you have.  Any surgeries you have had.  Any medical conditions you have.  Whether you are pregnant or may be pregnant. What are the risks? Generally, this is a safe procedure. However, problems may occur, including:  Infection.  Bleeding.  Allergic reactions to medicines.  Damage to other structures or organs.  What happens before the procedure?  Ask your health care provider about: ? Changing or stopping your regular medicines. This is especially important if you are taking diabetes medicines or blood thinners. ? Taking medicines such as aspirin and ibuprofen. These medicines can thin your blood. Do not take these medicines  before your procedure if your health care provider instructs you not to.  Follow instructions from your health care provider about eating or drinking restrictions.  You may be given antibiotic medicine to help prevent infection.  You may have an exam or testing, such as X-rays of the bladder, urethra, or kidneys.  You may have urine tests to check for signs of infection.  Plan to have someone take you home after the procedure. What happens during the procedure?  To reduce your risk of infection,your health care team will wash or sanitize their hands.  You will be given one or more of the following: ? A medicine to help you relax (sedative). ? A medicine to numb the area (local anesthetic).  The area around the opening of your urethra will be cleaned.  The cystoscope will be passed through your urethra into your bladder.  Germ-free (sterile)fluid will flow through the cystoscope to fill your bladder. The fluid will stretch your bladder so that your surgeon can clearly examine your bladder walls.  The cystoscope will be removed and your bladder will be emptied. The procedure may vary among health care providers and hospitals. What happens after the procedure?  You may have some soreness or pain in your abdomen and urethra. Medicines will be available to help you.  You may have some blood in your urine.  Do not drive for 24 hours if you received a sedative. This information is not intended to replace advice given to you by your health care provider. Make sure you discuss any questions you have with your health care provider. Document Released: 03/03/2000 Document Revised: 07/15/2015 Document Reviewed: 01/21/2015 [employer]   2017 [employer]  

## 2016-05-17 ENCOUNTER — Ambulatory Visit (INDEPENDENT_AMBULATORY_CARE_PROVIDER_SITE_OTHER): Payer: Medicare Other | Admitting: Urology

## 2016-05-17 ENCOUNTER — Encounter: Payer: Self-pay | Admitting: Urology

## 2016-05-17 VITALS — BP 124/78 | HR 75 | Ht 69.0 in | Wt 172.9 lb

## 2016-05-17 DIAGNOSIS — N186 End stage renal disease: Secondary | ICD-10-CM

## 2016-05-17 DIAGNOSIS — N3080 Other cystitis without hematuria: Secondary | ICD-10-CM

## 2016-05-17 DIAGNOSIS — R300 Dysuria: Secondary | ICD-10-CM | POA: Diagnosis not present

## 2016-05-17 LAB — MICROSCOPIC EXAMINATION
Bacteria, UA: NONE SEEN
Epithelial Cells (non renal): NONE SEEN /hpf (ref 0–10)
RBC, UA: NONE SEEN /hpf (ref 0–?)

## 2016-05-17 LAB — URINALYSIS, COMPLETE
BILIRUBIN UA: NEGATIVE
KETONES UA: NEGATIVE
NITRITE UA: NEGATIVE
SPEC GRAV UA: 1.015 (ref 1.005–1.030)
UUROB: 0.2 mg/dL (ref 0.2–1.0)
pH, UA: 7 (ref 5.0–7.5)

## 2016-05-17 MED ORDER — CIPROFLOXACIN HCL 500 MG PO TABS
500.0000 mg | ORAL_TABLET | Freq: Once | ORAL | Status: AC
  Administered 2016-05-17: 500 mg via ORAL

## 2016-05-17 MED ORDER — LIDOCAINE HCL 2 % EX GEL
1.0000 "application " | Freq: Once | CUTANEOUS | Status: AC
  Administered 2016-05-17: 1 via URETHRAL

## 2016-05-17 NOTE — Progress Notes (Signed)
05/17/2016 9:40 AM   Todd [name] 1941/02/16 [id number]  Referring provider: Leonel Ramsay, MD [place], [place] [address]  CC: Follow up and cytso  HPI:  1. ESRD (end stage renal disease) - on peritoneal dialysis for ESRD due to diabetes / medical renal disease. Multiple imaging studies w/o hdyro or stones.  2. Dysuria / Pyocystis - pt makes little urine with dysuria and milky color. UCX negative for clonal growth x many, just pyuria. 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 He still voids about 4X per day and has never had retention from inspisated pyocystis.   3. Rule Out Bladder Mass - slight area of thickening bladder dome noted on renal US 2018. Cysto 2018 confirms no papilary tumor.  PMH sig for severe CAD/MI (too unstable to undergo CABG per report), CVA, ESRD, DVT.   Today " Todd [name]" is seen to proceed with cysto to furhter characterize pyocystis and r/o bladder cancer.    PMH: Past Medical History:  Diagnosis Date  . 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 CKD (chronic kidney disease)   . Coronary artery disease   . CVA (cerebrovascular accident) ([place])   . Diabetes mellitus    lantus and novolog;fasting sugars run 70-120  . DVT (deep venous thrombosis) ([place])   . Hyperlipidemia    takes Crestor daily  . Hypertension    takes Amlodipine and Metoprolol  . Joint pain    knees,elbows,back  . PONV (postoperative nausea and vomiting)     Surgical History: Past Surgical History:  Procedure Laterality Date  . BACK SURGERY  09/15/10  . CARDIAC CATHETERIZATION  01/2006   2 stents placed  . CARDIAC CATHETERIZATION Right 12/20/2015   Procedure: Coronary Angiography;  Surgeon: Isaias Cowman, MD;  Location: [facility];  Service: Cardiovascular;  Laterality: Right;  . CATARACT EXTRACTION  12/21/10--02/01/11   right and left  . CHOLECYSTECTOMY  20+yrs ago  . COLONOSCOPY    . ELBOW SURGERY  2012     right elbow  . KNEE ARTHROSCOPY  80's   right knee  . TOTAL HIP ARTHROPLASTY    . ULNAR NERVE TRANSPOSITION  03/02/2011   Procedure: ULNAR NERVE DECOMPRESSION/TRANSPOSITION;  Surgeon: Peggyann Shoals, MD;  Location: [facility];  Service: Neurosurgery;  Laterality: Left;  LEFT ulnar nerve decompression    Home Medications:  Allergies as of 05/17/2016      Reactions   Centrum Other (See Comments)   Bloating, indigestion, chest pain   Valsartan Other (See Comments)   Unsure of symptoms, was on list from previous hospital and patient was unsure of name of medicine.   Vitamin D Analogs Other (See Comments)   Bloating and swelling in abdomen region, indigestion. Prescription vit d not otc      Medication List       Accurate as of 05/17/16  9:40 AM. Always use your most recent med list.          amLODipine 10 MG tablet Commonly known as:  NORVASC Take 10 mg by mouth daily.   aspirin EC 81 MG tablet Take 1 tablet (81 mg total) by mouth daily.   calcium acetate 667 MG capsule Commonly known as:  PHOSLO   carvedilol 6.25 MG tablet Commonly known as:  COREG   cholecalciferol 1000 units tablet Commonly known as:  VITAMIN D Take 1,000 Units by mouth daily.   ciprofloxacin 250 MG tablet Commonly known as:  CIPRO Take 1 tablet (250 mg total) by mouth 2 (two) times daily.   clopidogrel 75 MG tablet Commonly known as:  PLAVIX Take 1 tablet (75 mg total) by mouth daily.   econazole nitrate 1 % cream Apply 1 application topically daily.   furosemide 40 MG tablet Commonly known as:  LASIX   insulin glargine 100 UNIT/ML injection Commonly known as:  LANTUS Inject 5-8 Units into the skin at bedtime. 5 units on Sunday, Tuesday, and Thursday. 8 units on Monday, Wednesday, Friday, and Saturday.   insulin lispro 100 UNIT/ML injection Commonly known as:  HUMALOG Inject 5 Units into the skin daily as needed for high blood sugar. Takes at lunch if blood sugar is over 150    insulin NPH-regular Human (70-30) 100 UNIT/ML injection Commonly known as:  NOVOLIN 70/30 Inject 12units 34min before onset of dialysis. Dispense the vial not the pen   ONE TOUCH ULTRA TEST test strip Generic drug:  glucose blood 1 each by Other route 4 (four) times daily.   ONE TOUCH ULTRA TEST test strip Generic drug:  glucose blood 1 each by Other route 4 (four) times daily.   rosuvastatin 20 MG tablet Commonly known as:  CRESTOR Take 20 mg by mouth at bedtime.   sodium bicarbonate 650 MG tablet Take 1,950 mg by mouth 3 (three) times daily.   sodium polystyrene 15 GM/60ML suspension Commonly known as:  KAYEXALATE Take 15 g by mouth as needed.       Allergies:  Allergies  Allergen Reactions  . Centrum Other (See Comments)    Bloating, indigestion, chest pain  . Valsartan Other (See Comments)    Unsure of symptoms, was on list from previous hospital and patient was unsure of name of medicine.  . Vitamin D Analogs Other (See Comments)    Bloating and swelling in abdomen region, indigestion. Prescription vit d not otc    Family History: Family History  Problem Relation Age of Onset  . Diabetes Mother   . Heart disease Father   . Lung cancer Father   . Anesthesia problems Neg Hx   . Hypotension Neg Hx   . Malignant hyperthermia Neg Hx   . Pseudochol deficiency Neg Hx   . Kidney disease Neg Hx   . Kidney cancer Neg Hx   . Prostate cancer Neg Hx     Social History:  reports that he has never smoked. He has never used smokeless tobacco. He reports that he does not drink alcohol or use drugs.     Review of Systems  Gastrointestinal (upper)  : Negative for upper GI symptoms  Gastrointestinal (lower) : Negative for lower GI symptoms  Constitutional : Negative for symptoms  Skin: Negative for skin symptoms  Eyes: Negative for eye symptoms  Ear/Nose/Throat : Negative for Ear/Nose/Throat symptoms  Hematologic/Lymphatic: Negative for  Hematologic/Lymphatic symptoms  Cardiovascular : Negative for cardiovascular symptoms  Respiratory : Negative for respiratory symptoms  Endocrine: Negative for endocrine symptoms  Musculoskeletal: Negative for musculoskeletal symptoms  Neurological: Negative for neurological symptoms  Psychologic: Negative for psychiatric symptoms   Physical Exam: There were no vitals taken for this visit.  Constitutional:  Alert and oriented, No acute distress. HEENT: [place] AT, moist mucus membranes.  Trachea midline, no masses. Cardiovascular: No clubbing, cyanosis, or edema. Respiratory: Normal respiratory effort, no increased work of breathing. GI: Abdomen is soft, nontender, nondistended, no abdominal masses. PD catheter noted and c/d/i.  GU: No CVA tenderness. Striaght phalus. No scrotal masses.  Skin: No rashes, bruises or suspicious lesions. Lymph: No cervical or inguinal adenopathy. Neurologic: Grossly intact, no focal deficits, moving all 4 extremities. Psychiatric: Normal mood and affect.  Laboratory Data: Lab Results  Component Value Date   WBC 8.3 12/23/2015   HGB 10.2 (L) 12/23/2015   HCT 28.8 (L) 12/23/2015   MCV 92.4 12/23/2015   PLT 272 12/23/2015    Lab Results  Component Value Date   CREATININE 6.26 (H) 12/24/2015    No results found for: PSA  No results found for: TESTOSTERONE  Lab Results  Component Value Date   HGBA1C 6.1 (H) 01/25/2015    Urinalysis    Component Value Date/Time   COLORURINE Straw 02/28/2014 0312   APPEARANCEUR Cloudy (A) 04/11/2016 1509   LABSPEC 1.012 02/28/2014 0312   PHURINE 6.0 02/28/2014 0312   GLUCOSEU 1+ (A) 04/11/2016 1509   GLUCOSEU 150 mg/dL 02/28/2014 0312   HGBUR 1+ 02/28/2014 0312   BILIRUBINUR Negative 04/11/2016 1509   BILIRUBINUR Negative 02/28/2014 0312   KETONESUR Negative 02/28/2014 0312   PROTEINUR 3+ (A) 04/11/2016 1509   PROTEINUR >=500 02/28/2014 0312   NITRITE Negative 04/11/2016 1509   NITRITE  Negative 02/28/2014 0312   LEUKOCYTESUR 2+ (A) 04/11/2016 1509   LEUKOCYTESUR Negative 02/28/2014 0312    Pertinent Imaging: CT and [place] indepentantly reviewed as per HPI    05/17/16  CC:  Chief Complaint  Patient presents with  . Cysto    microscopic hematuria     HPI:  Blood pressure 124/78, pulse 75, height 5\' 9"  (1.753 m), weight 78.4 kg (172 lb 14.4 oz). NED. A&Ox3.   No respiratory distress   Abd soft, NT, ND Normal phallus with bilateral descended testicles  Cystoscopy Procedure Note  Patient identification was confirmed, informed consent was obtained, and patient was prepped using Betadine solution.  Lidocaine jelly was administered per urethral meatus.    Preoperative abx where received prior to procedure.     Pre-Procedure: - Inspection reveals a normal caliber ureteral meatus.  Procedure: The flexible cystoscope was introduced without difficulty - No urethral strictures/lesions are present. - Enlarged prostate (age appropriate) - Normal bladder neck - Bilateral ureteral orifices identified - Bladder mucosa  reveals no ulcers, tumors, or lesions - No bladder stones - No trabeculation - Significant pyocystis that is non-foul. This was irrigated x several to clear.   Retroflexion shows no additional findings.    Post-Procedure: - Patient tolerated the procedure well  Assessment/ Plan:   Assessment & Plan:    1. ESRD (end stage renal disease) - non-obstructive medical renal disease. Stable on peritoneal dialysis.   2. Dysuria / Pyocystis - this is a results of urothelial sloughing in setting of low UOP. This is not dangerous. Fortunatley this remains sterile. Options of observation (reccomended to maintain sterility)  v. Daily self-cath with irrigation discussed and we al agree on observation unless becomes so thick / inspisated that is obsructive. Should that happy, then will need daily self-cath and irrigation.    3. Rule Out Bladder Mass  - no  intraluminal lesions by cysto, this is inflammatoin from pyocystis.    Alexis Frock, [facility] [address], [place], [place] [address] [phone]

## 2016-07-14 ENCOUNTER — Ambulatory Visit (INDEPENDENT_AMBULATORY_CARE_PROVIDER_SITE_OTHER): Payer: Medicare Other

## 2016-07-14 VITALS — BP 135/71 | Ht 69.0 in | Wt 177.5 lb

## 2016-07-14 DIAGNOSIS — R300 Dysuria: Secondary | ICD-10-CM | POA: Diagnosis not present

## 2016-07-14 NOTE — Progress Notes (Signed)
Pt c/o urinary frequency x3 days and burning x1 day. His last Cipro dose was 2-3 weeks ago per spouse. Pt was unable to provide enough urine sample in office to be cultured. He was given urine cups to take home. Pt and spouse given instructions to return sample on Monday. Both verbalized understanding.

## 2016-07-15 LAB — URINALYSIS, COMPLETE
BILIRUBIN UA: NEGATIVE
KETONES UA: NEGATIVE
Nitrite, UA: NEGATIVE
Specific Gravity, UA: 1.02 (ref 1.005–1.030)
UUROB: 0.2 mg/dL (ref 0.2–1.0)
pH, UA: 6 (ref 5.0–7.5)

## 2016-07-15 LAB — MICROSCOPIC EXAMINATION
Epithelial Cells (non renal): NONE SEEN /hpf (ref 0–10)
RBC, UA: NONE SEEN /hpf (ref 0–?)

## 2016-07-19 ENCOUNTER — Telehealth: Payer: Self-pay

## 2016-07-19 LAB — URINE CULTURE: Organism ID, Bacteria: NO GROWTH

## 2016-07-19 NOTE — Telephone Encounter (Signed)
-----   Message from Nori Riis, PA-C sent at 07/19/2016  8:06 AM EDT ----- Please let Todd [name] know his urine culture was negative. If he is still experiencing symptoms, Dr. Tresa Moore has suggested CIC with irrigation.  He can certainly come in and we can instruct him on this technique.

## 2016-07-19 NOTE — Telephone Encounter (Signed)
Spoke with pt wife in reference to -ucx. Wife stated pt is continuing to have s/s. Reinforced with wife pt should learn CIC with irrigation. Wife voiced understanding. Pt was added to nurse schedule tomorrow for CIC teaching.

## 2016-07-20 ENCOUNTER — Ambulatory Visit (INDEPENDENT_AMBULATORY_CARE_PROVIDER_SITE_OTHER): Payer: Medicare Other

## 2016-07-20 VITALS — BP 143/71 | HR 69 | Ht 69.0 in | Wt 178.4 lb

## 2016-07-20 DIAGNOSIS — N138 Other obstructive and reflux uropathy: Secondary | ICD-10-CM

## 2016-07-20 DIAGNOSIS — N401 Enlarged prostate with lower urinary tract symptoms: Secondary | ICD-10-CM | POA: Diagnosis not present

## 2016-07-20 NOTE — Progress Notes (Signed)
Continuous Intermittent Catheterization  Due to incomplete bladder emptying with sediment patient is present today for a teaching of self I & O Catheterization. Patient was given detailed verbal and printed instructions of self catheterization. Patient was cleaned and prepped in a sterile fashion.  With instruction and assistance patient inserted a 14FR and urine return was noted 200 ml, urine was cloudy yellow in color. Patient tolerated well, no complications were noted Patient was given a sample bag with supplies to take home.  Instructions were given per Larene Beach for patient to cath once times daily.  An order was placed with [facility] for catheters to be sent to the patient's home.   Preformed by: Toniann Fail, LPN   Blood pressure (!) 143/71, pulse 69, height 5\' 9"  (1.753 m), weight 178 lb 6.4 oz (80.9 kg).

## 2016-07-21 ENCOUNTER — Telehealth: Payer: Self-pay

## 2016-07-21 NOTE — Telephone Encounter (Signed)
Pt needs coude catheter due to inability to pass catheter with a straight tip.

## 2017-01-19 IMAGING — DX DG CHEST 1V PORT
1 series · 1 of 1 positions shown · non-contrast
Comparison: 12/13/2015 CT scan

CLINICAL DATA: Central line placement. Bilateral airspace opacities
in the lungs on CT.

EXAM:
PORTABLE CHEST 1 VIEW

[chest ap]
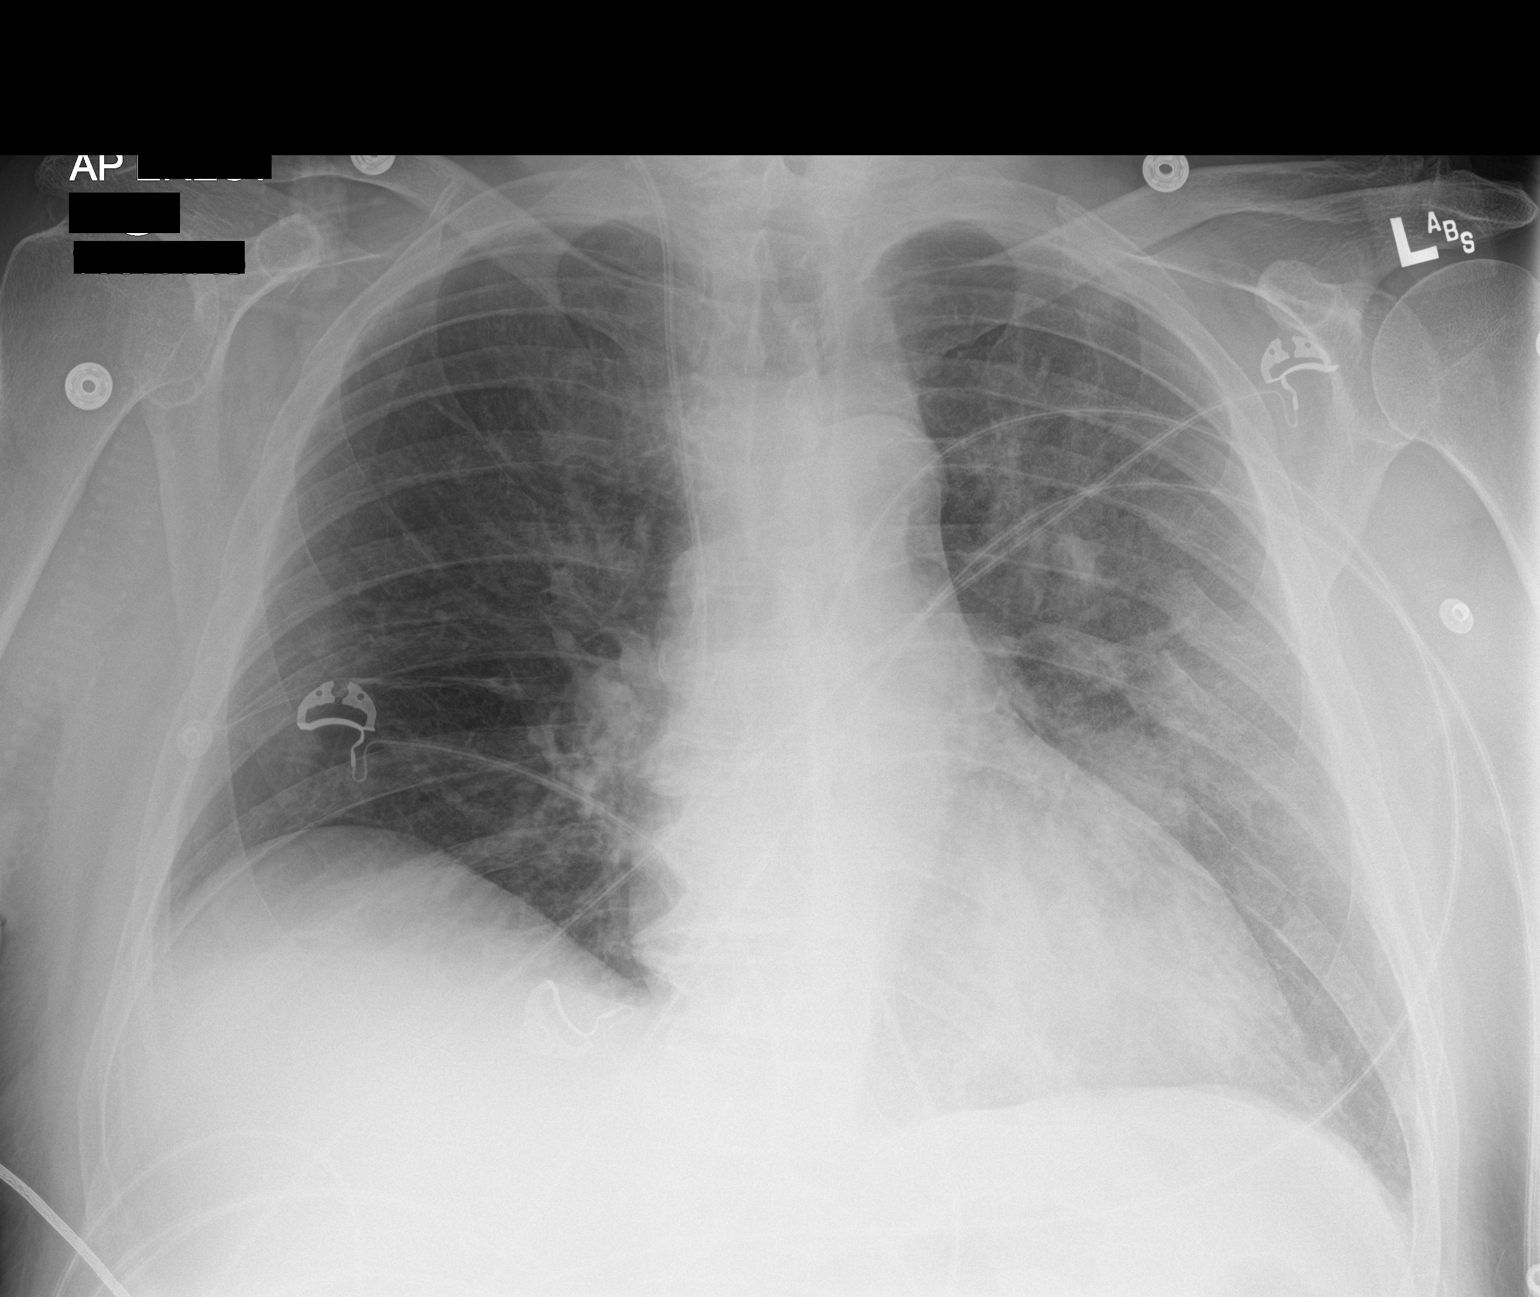

[1 of 1 positions shown; findings below may reference images not displayed]

FINDINGS: Right internal jugular center venous catheter tip: SVC. No
pneumothorax.

Airspace opacities are observed primarily in the left lung, with
only very vague hazy density in the right lung. The appearance is
similar to that noted on today' s chest CT.

Thoracic spondylosis. Old healed left posterolateral rib fractures.
Atherosclerotic calcification of the aortic arch.
IMPRESSION: 1. Right IJ line tip:  SVC.  No pneumothorax.
2. Stable appearance of airspace opacities in the lungs, left
greater than right.
3. Atherosclerotic calcification of the aortic arch.
4. Thoracic spondylosis.

## 2017-01-21 ENCOUNTER — Emergency Department
Admission: EM | Admit: 2017-01-21 | Discharge: 2017-01-22 | Disposition: A | Discharge status: Home / Self Care | Payer: Medicare Other | Attending: Emergency Medicine | Admitting: Emergency Medicine

## 2017-01-21 ENCOUNTER — Emergency Department: Payer: Medicare Other

## 2017-01-21 ENCOUNTER — Encounter: Payer: Self-pay | Admitting: Emergency Medicine

## 2017-01-21 DIAGNOSIS — Z96649 Presence of unspecified artificial hip joint: Secondary | ICD-10-CM | POA: Diagnosis not present

## 2017-01-21 DIAGNOSIS — N186 End stage renal disease: Secondary | ICD-10-CM | POA: Diagnosis not present

## 2017-01-21 DIAGNOSIS — Z7982 Long term (current) use of aspirin: Secondary | ICD-10-CM | POA: Diagnosis not present

## 2017-01-21 DIAGNOSIS — Z8673 Personal history of transient ischemic attack (TIA), and cerebral infarction without residual deficits: Secondary | ICD-10-CM | POA: Diagnosis not present

## 2017-01-21 DIAGNOSIS — R1010 Upper abdominal pain, unspecified: Secondary | ICD-10-CM | POA: Diagnosis present

## 2017-01-21 DIAGNOSIS — E1122 Type 2 diabetes mellitus with diabetic chronic kidney disease: Secondary | ICD-10-CM | POA: Diagnosis not present

## 2017-01-21 DIAGNOSIS — Z7901 Long term (current) use of anticoagulants: Secondary | ICD-10-CM | POA: Insufficient documentation

## 2017-01-21 DIAGNOSIS — Z86718 Personal history of other venous thrombosis and embolism: Secondary | ICD-10-CM | POA: Diagnosis not present

## 2017-01-21 DIAGNOSIS — Z794 Long term (current) use of insulin: Secondary | ICD-10-CM | POA: Insufficient documentation

## 2017-01-21 DIAGNOSIS — I120 Hypertensive chronic kidney disease with stage 5 chronic kidney disease or end stage renal disease: Secondary | ICD-10-CM | POA: Insufficient documentation

## 2017-01-21 DIAGNOSIS — K659 Peritonitis, unspecified: Secondary | ICD-10-CM | POA: Diagnosis not present

## 2017-01-21 DIAGNOSIS — Z79899 Other long term (current) drug therapy: Secondary | ICD-10-CM | POA: Insufficient documentation

## 2017-01-21 DIAGNOSIS — I2510 Atherosclerotic heart disease of native coronary artery without angina pectoris: Secondary | ICD-10-CM | POA: Insufficient documentation

## 2017-01-21 LAB — URINALYSIS, COMPLETE (UACMP) WITH MICROSCOPIC
Bilirubin Urine: NEGATIVE
KETONES UR: NEGATIVE mg/dL
NITRITE: NEGATIVE
PROTEIN: 100 mg/dL — AB
SPECIFIC GRAVITY, URINE: 1.005 (ref 1.005–1.030)
pH: 7 (ref 5.0–8.0)

## 2017-01-21 LAB — CBC
HEMATOCRIT: 33.7 % — AB (ref 40.0–52.0)
Hemoglobin: 11.6 g/dL — ABNORMAL LOW (ref 13.0–18.0)
MCH: 33.4 pg (ref 26.0–34.0)
MCHC: 34.5 g/dL (ref 32.0–36.0)
MCV: 96.7 fL (ref 80.0–100.0)
Platelets: 275 10*3/uL (ref 150–440)
RBC: 3.48 MIL/uL — ABNORMAL LOW (ref 4.40–5.90)
RDW: 12.8 % (ref 11.5–14.5)
WBC: 4.9 10*3/uL (ref 3.8–10.6)

## 2017-01-21 LAB — BASIC METABOLIC PANEL
Anion gap: 10 (ref 5–15)
BUN: 47 mg/dL — AB (ref 6–20)
CALCIUM: 8.5 mg/dL — AB (ref 8.9–10.3)
CO2: 29 mmol/L (ref 22–32)
Chloride: 98 mmol/L — ABNORMAL LOW (ref 101–111)
Creatinine, Ser: 4.74 mg/dL — ABNORMAL HIGH (ref 0.61–1.24)
GFR calc Af Amer: 13 mL/min — ABNORMAL LOW (ref >60)
GFR, EST NON AFRICAN AMERICAN: 11 mL/min — AB (ref >60)
GLUCOSE: 215 mg/dL — AB (ref 65–99)
Potassium: 3.4 mmol/L — ABNORMAL LOW (ref 3.5–5.1)
Sodium: 137 mmol/L (ref 135–145)

## 2017-01-21 NOTE — ED Provider Notes (Addendum)
[facility] Emergency Department Provider Note  ____________________________________________   I have reviewed the triage vital signs and the nursing notes.   HISTORY  Chief Complaint Flank Pain    HPI Todd [name] is a 76 y.o. male with a history of CKD, diabetes, peritoneal dialysis, presents today with right flank pain.  Is been there off and on for a few days.  Denies hematuria.  No other symptoms.  Pain is to the right side, sometimes seems to radiate towards his groin.  No fever no chills no vomiting no other complaints.  Is a sharp pain.  Is gone at this time.  No other associating symptoms nothing makes it better nothing makes it worse.  Has not had this before no history of stones.  Has had no trouble with his dialysis and has no abdominal pain    Past Medical History:  Diagnosis Date  . 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 CKD (chronic kidney disease)   . Coronary artery disease   . CVA (cerebrovascular accident) ([place])   . Diabetes mellitus    lantus and novolog;fasting sugars run 70-120  . DVT (deep venous thrombosis) ([place])   . Hyperlipidemia    takes Crestor daily  . Hypertension    takes Amlodipine and Metoprolol  . Joint pain    knees,elbows,back  . PONV (postoperative nausea and vomiting)     Patient Active Problem List   Diagnosis Date Noted  . Dysuria 05/17/2016  . Pyocystis 05/17/2016  . Pressure injury of skin 12/22/2015  . Central line insertion site infection   . Hypoxia   . Respiratory failure with hypoxia ([place])   . Pneumonia 12/11/2015  . ESRD (end stage renal disease) ([place]) 12/11/2015  . CAD (coronary artery disease) 12/11/2015  . Essential hypertension 04/29/2015  . 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 HLD (hyperlipidemia) 04/29/2015  . Thalamic hemorrhage ([place])   . ICH (intracerebral hemorrhage) ([place]) 02/01/2015  . Abdominal pain 01/25/2015  . Acute hepatitis   . Enteritis   .  Ileus ([place])   . Uncontrollable vomiting     Past Surgical History:  Procedure Laterality Date  . BACK SURGERY  09/15/10  . CARDIAC CATHETERIZATION  01/2006   2 stents placed  . CATARACT EXTRACTION  12/21/10--02/01/11   right and left  . CHOLECYSTECTOMY  20+yrs ago  . COLONOSCOPY    . ELBOW SURGERY  2012   right elbow  . KNEE ARTHROSCOPY  80's   right knee  . TOTAL HIP ARTHROPLASTY      Prior to Admission medications   Medication Sig Start Date End Date Taking? Authorizing Provider  amLODipine (NORVASC) 10 MG tablet Take 10 mg by mouth daily.  04/05/15   [provider]  aspirin EC 81 MG tablet Take 1 tablet (81 mg total) by mouth daily. 02/18/15   Donzetta Starch, NP  calcium acetate (PHOSLO) 667 MG capsule  01/04/16   [provider]  carvedilol (COREG) 6.25 MG tablet  01/31/16   [provider]  cholecalciferol (VITAMIN D) 1000 UNITS tablet Take 1,000 Units by mouth daily.      [provider]  ciprofloxacin (CIPRO) 250 MG tablet Take 1 tablet (250 mg total) by mouth 2 (two) times daily. Patient not taking: Reported on 04/26/2016 04/11/16   Zara Council A, PA-C  clopidogrel (PLAVIX) 75 MG tablet Take 1 tablet (75 mg total) by mouth daily. 12/24/15   Bettey Costa, MD  econazole nitrate 1 % cream Apply 1 application topically daily.    [provider]  furosemide (LASIX) 40 MG tablet  01/04/16   [provider]  glucose blood (ONE TOUCH ULTRA TEST) test strip 1 each by Other route 4 (four) times daily.  09/08/14   [provider]  insulin glargine (LANTUS) 100 UNIT/ML injection Inject 5-8 Units into the skin at bedtime. 5 units on Sunday, Tuesday, and Thursday. 8 units on Monday, Wednesday, Friday, and Saturday.    [provider]  insulin lispro (HUMALOG) 100 UNIT/ML injection Inject 5 Units into the skin daily as needed for high blood sugar. Takes at lunch if blood sugar is over 150    [provider]   insulin NPH-regular Human (NOVOLIN 70/30) (70-30) 100 UNIT/ML injection Inject 12units 86min before onset of dialysis. Dispense the vial not the pen 04/04/16   [provider]  ONE TOUCH ULTRA TEST test strip 1 each by Other route 4 (four) times daily.  03/16/15   [provider]  rosuvastatin (CRESTOR) 20 MG tablet Take 20 mg by mouth at bedtime.  02/23/15   [provider]  sodium bicarbonate 650 MG tablet Take 1,950 mg by mouth 3 (three) times daily.     [provider]  sodium polystyrene (KAYEXALATE) 15 GM/60ML suspension Take 15 g by mouth as needed.  04/06/15   [provider]    Allergies Centrum; Valsartan; and Vitamin d analogs  Family History  Problem Relation Age of Onset  . Diabetes Mother   . Heart disease Father   . Lung cancer Father   . Anesthesia problems Neg Hx   . Hypotension Neg Hx   . Malignant hyperthermia Neg Hx   . Pseudochol deficiency Neg Hx   . Kidney disease Neg Hx   . Kidney cancer Neg Hx   . Prostate cancer Neg Hx     Social History Social History   Tobacco Use  . Smoking status: Never Smoker  . Smokeless tobacco: Never Used  Substance Use Topics  . Alcohol use: No  . Drug use: No    Review of Systems Constitutional: No fever/chills Eyes: No visual changes. ENT: No sore throat. No stiff neck no neck pain Cardiovascular: Denies chest pain. Respiratory: Denies shortness of breath. Gastrointestinal:   no vomiting.  No diarrhea.  No constipation. Genitourinary: Negative for dysuria. Musculoskeletal: Negative lower extremity swelling Skin: Negative for rash. Neurological: Negative for severe headaches, focal weakness or numbness.   ____________________________________________   PHYSICAL EXAM:  VITAL SIGNS: ED Triage Vitals [01/21/17 1934]  Enc Vitals Group     BP (!) 119/58     Pulse Rate 100     Resp 18     Temp 98.9 F (37.2 C)     Temp Source Oral     SpO2 100 %     Weight 177 lb  (80.3 kg)     Height 5\' 10"  (1.778 m)     Head Circumference      Peak Flow      Pain Score 5     Pain Loc      Pain Edu?      Excl. in [place]?     Constitutional: Alert and oriented. Well appearing and in no acute distress. Eyes: Conjunctivae are normal Head: Atraumatic HEENT: No congestion/rhinnorhea. Mucous membranes are moist.  Oropharynx non-erythematous Neck:   Nontender with no meningismus, no masses, no stridor Cardiovascular: Normal rate, regular rhythm. Grossly normal heart  sounds.  Good peripheral circulation. Respiratory: Normal respiratory effort.  No retractions. Lungs CTAB. Abdominal: Soft and nontender. No distention. No guarding no rebound Back:  There is no focal tenderness or step off.  there is no midline tenderness there are no lesions noted. there is positive right CVA tenderness Musculoskeletal: No lower extremity tenderness, no upper extremity tenderness. No joint effusions, no DVT signs strong distal pulses no edema Neurologic:  Normal speech and language. No gross focal neurologic deficits are appreciated.  Skin:  Skin is warm, dry and intact. No rash noted. Psychiatric: Mood and affect are normal. Speech and behavior are normal.  ____________________________________________   LABS (all labs ordered are listed, but only abnormal results are displayed)  Labs Reviewed  URINALYSIS, COMPLETE (UACMP) WITH MICROSCOPIC - Abnormal; Notable for the following components:      Result Value   Color, Urine STRAW (*)    APPearance CLEAR (*)    Glucose, UA >=500 (*)    Hgb urine dipstick SMALL (*)    Protein, ur 100 (*)    Leukocytes, UA TRACE (*)    Bacteria, UA RARE (*)    Squamous Epithelial / LPF 0-5 (*)    All other components within normal limits  BASIC METABOLIC PANEL - Abnormal; Notable for the following components:   Potassium 3.4 (*)    Chloride 98 (*)    Glucose, Bld 215 (*)    BUN 47 (*)    Creatinine, Ser 4.74 (*)    Calcium 8.5 (*)    GFR calc non  Af Amer 11 (*)    GFR calc Af Amer 13 (*)    All other components within normal limits  CBC - Abnormal; Notable for the following components:   RBC 3.48 (*)    Hemoglobin 11.6 (*)    HCT 33.7 (*)    All other components within normal limits    Pertinent labs  results that were available during my care of the patient were reviewed by me and considered in my medical decision making (see chart for details). ____________________________________________  EKG  I personally interpreted any EKGs ordered by me or triage  ____________________________________________  RADIOLOGY  Pertinent labs & imaging results that were available during my care of the patient were reviewed by me and considered in my medical decision making (see chart for details). If possible, patient and/or family made aware of any abnormal findings. ____________________________________________    PROCEDURES  Procedure(s) performed: None  Procedures  Critical Care performed: None  ____________________________________________   INITIAL IMPRESSION / ASSESSMENT AND PLAN / ED COURSE  Pertinent labs & imaging results that were available during my care of the patient were reviewed by me and considered in my medical decision making (see chart for details).  Patient with peritoneal dialysis presents here with right flank pain and hematuria, kidney stone is most likely etiology based on symptoms and exam.  UTI is also possible no evidence of acute peritonitis.  Patient has a benign abdomen, no white count no fever no vomiting and no complaints of abdominal pain we will obtain CT scan to further evaluate.  Signed out to dr. Jacqualine Code at the end of my shift.    ____________________________________________   FINAL CLINICAL IMPRESSION(S) / ED DIAGNOSES  Final diagnoses:  None      This chart was dictated using voice recognition software.  Despite best efforts to proofread,  errors can occur which can change meaning.       Schuyler Amor, MD  01/21/17 2231    Schuyler Amor, MD 01/21/17 2235    Schuyler Amor, MD 01/21/17 2314

## 2017-01-21 NOTE — ED Notes (Signed)
Patient transported to CT 

## 2017-01-21 NOTE — ED Triage Notes (Signed)
Pt comes into the ED via POV c/o right flank pain that started 2 days ago.  Denies any urinary symptoms at this time.  Denies blood in urine.  Patient denies N/V/D, chest pain, or shortness of breath.  Patient in NAd at this time with even and unlabored respirations.

## 2017-01-22 LAB — BODY FLUID CELL COUNT WITH DIFFERENTIAL
EOS FL: 0 %
Lymphs, Fluid: 1 %
Monocyte-Macrophage-Serous Fluid: 2 %
NEUTROPHIL FLUID: 97 %
Other Cells, Fluid: 0 %
WBC FLUID: 525 uL

## 2017-01-22 LAB — GLUCOSE, CAPILLARY: Glucose-Capillary: 239 mg/dL — ABNORMAL HIGH (ref 65–99)

## 2017-01-22 LAB — PATHOLOGIST SMEAR REVIEW

## 2017-01-22 MED ORDER — VANCOMYCIN HCL IN DEXTROSE 1-5 GM/200ML-% IV SOLN
1000.0000 mg | Freq: Once | INTRAVENOUS | Status: AC
  Administered 2017-01-22: 1000 mg via INTRAVENOUS
  Filled 2017-01-22: qty 200

## 2017-01-22 MED ORDER — DEXTROSE 5 % IV SOLN
1.0000 g | Freq: Once | INTRAVENOUS | Status: AC
  Administered 2017-01-22: 1 g via INTRAVENOUS
  Filled 2017-01-22: qty 1

## 2017-01-22 NOTE — ED Provider Notes (Signed)
Patient was plan for admission, however seen by hospitalist service and evaluated in discussed with Dr. Zollie Scale, they were able to coordinate for the patient to be discharged as he is not presently meeting admission criteria and will have intraperitoneal antibiotics initiated via his [facility] nephrology clinic and may have been today at 1030.  I discussed this plan with both the patient and his wife (via phone), both are comfortable with the plan and understand they are to go to his nephrologist and may have been at 10:30 AM this morning for further treatment and care.  Patient resting comfortably, agreeable with plan, wife will be picking him up and the plan is to go to his nephrologist at 1030 this morning.  I discussed very careful return precautions.  Return precautions and treatment recommendations and follow-up discussed with the patient who is agreeable with the plan.    Delman Kitten, MD 01/22/17 [phone]

## 2017-01-22 NOTE — ED Provider Notes (Addendum)
Previously checked on the patient, he reports minimal discomfort except for a hard to describe discomfort and across his entire abdomen.  On exam he is minimally tender in all quadrants without rebound or guarding or evidence of frank peritonitis.  I discussed his case with Dr. Zollie Scale of nephrology, given there is no clear explanation for his symptoms on his CT we will proceed by obtaining an sending peritoneal fluid for analysis to rule out SBP.  Dr. Holley Raring advises discharge if count less than 100 for nucleated cells  Patient and his wife are comfortable with plan, dialysis nurse to draw dialysate for lab analysis.   Delman Kitten, MD 01/22/17 0110   ----------------------------------------- 3:49 AM on 01/22/2017 -----------------------------------------  Reviewed results with Dr. Zollie Scale, indicates probable SBP.  Dr. Holley Raring recommends admission and starting on 1 g Fortaz and 1 g of vancomycin.  Discussed and updated the patient is agreeable with plan for admission and understanding of treatment initiated for bacterial peritonitis     Delman Kitten, MD 01/22/17 [phone]

## 2017-01-22 NOTE — Progress Notes (Signed)
Instilled 1057ml of 1.5% PD  Dialysis solution in peritoneum and allowed to dwell as patient repositoned to insure full coverage.   Then drained approx. 1035ml of peritoneal fluid into sterile pd drain bag.  Effluent was amber in color and clear with only small amount of fibrin visualized.  Sample of fluid was then transferred to a sterile sample cup and taken to lab for testing.  Patients pd catheter transfer set was then capped and secured by his wife with patients pd belt.

## 2017-01-22 NOTE — ED Notes (Signed)
ED Provider at bedside. 

## 2017-01-22 NOTE — Progress Notes (Addendum)
Called to admit for SBP. Reviewed chart and examined patient. Determined that he does not meet admission criteria. Spoke to Dr. Holley Raring of nephrology who agreed that IV Ceftazidime and Vanc adequate for first dose for suspected SBP. He should receive subsequent IP abx. Coordinated with patient's PD nurse Olivia Mackie, [phone]) who confirmed that the patient may come to the [facility] in [facility] to receive intraperitoneal abx. Patient agrees. Will be discharged home with follow up plan as above.

## 2017-01-22 NOTE — Discharge Instructions (Addendum)
Go to your [facility] dialysis center in [place] at 1030AM today. This is VERY important.  Please return to the emergency room right away if you are to develop a fever, severe nausea, your pain becomes severe or worsens, you are unable to keep food down, begin vomiting any dark or bloody fluid, you develop any dark or bloody stools, feel dehydrated, or other new concerns or symptoms arise.

## 2017-01-22 NOTE — ED Notes (Signed)
Dialysis RN at bedside.

## 2017-01-23 LAB — URINE CULTURE: Culture: NO GROWTH

## 2017-01-25 LAB — BODY FLUID CULTURE
Culture: NO GROWTH
SPECIAL REQUESTS: NORMAL

## 2017-05-04 ENCOUNTER — Emergency Department
Admission: EM | Admit: 2017-05-04 | Discharge: 2017-05-18 | Disposition: E | Payer: Medicare Other | Attending: Emergency Medicine | Admitting: Emergency Medicine

## 2017-05-04 DIAGNOSIS — I469 Cardiac arrest, cause unspecified: Secondary | ICD-10-CM | POA: Diagnosis not present

## 2017-05-04 DIAGNOSIS — E1122 Type 2 diabetes mellitus with diabetic chronic kidney disease: Secondary | ICD-10-CM | POA: Diagnosis not present

## 2017-05-04 DIAGNOSIS — N186 End stage renal disease: Secondary | ICD-10-CM | POA: Diagnosis not present

## 2017-05-04 DIAGNOSIS — Z992 Dependence on renal dialysis: Secondary | ICD-10-CM | POA: Diagnosis not present

## 2017-05-04 DIAGNOSIS — I2510 Atherosclerotic heart disease of native coronary artery without angina pectoris: Secondary | ICD-10-CM | POA: Diagnosis not present

## 2017-05-04 DIAGNOSIS — I120 Hypertensive chronic kidney disease with stage 5 chronic kidney disease or end stage renal disease: Secondary | ICD-10-CM | POA: Diagnosis not present

## 2017-05-04 DIAGNOSIS — R0603 Acute respiratory distress: Secondary | ICD-10-CM | POA: Diagnosis present

## 2017-05-04 DIAGNOSIS — Z794 Long term (current) use of insulin: Secondary | ICD-10-CM | POA: Insufficient documentation

## 2017-05-04 LAB — GLUCOSE, CAPILLARY: GLUCOSE-CAPILLARY: 322 mg/dL — AB (ref 65–99)

## 2017-05-04 MED ORDER — SODIUM BICARBONATE 8.4 % IV SOLN
INTRAVENOUS | Status: AC | PRN
  Administered 2017-05-04 (×2): 100 meq via INTRAVENOUS

## 2017-05-04 MED ORDER — SODIUM CHLORIDE 0.9 % IV SOLN
INTRAVENOUS | Status: AC | PRN
  Administered 2017-05-04: 1000 mL via INTRAVENOUS

## 2017-05-04 MED ORDER — EPINEPHRINE PF 1 MG/10ML IJ SOSY
PREFILLED_SYRINGE | INTRAMUSCULAR | Status: AC | PRN
  Administered 2017-05-04 (×4): 1 via INTRAVENOUS

## 2017-05-04 MED ORDER — ATROPINE SULFATE 1 MG/ML IJ SOLN
INTRAMUSCULAR | Status: AC | PRN
  Administered 2017-05-04: 1 mg via INTRAVENOUS

## 2017-05-04 MED ORDER — CALCIUM CHLORIDE 10 % IV SOLN
INTRAVENOUS | Status: AC | PRN
  Administered 2017-05-04: 1 g via INTRAVENOUS

## 2017-05-05 MED FILL — Medication: Qty: 1 | Status: AC

## 2017-05-18 NOTE — Progress Notes (Signed)
Wife and son at bedside, wanted pt released to [facility]. Explained was on hold for CDS. Wife [name] stated she did not want him to be a donor. Released by CDS. Wanted pick up in room. [employer] ETA 20 minutes. Gold colored ring,clothing,glasses released to [place], his wife

## 2017-05-18 NOTE — Code Documentation (Signed)
CPR paused for pulse check, no pulses present, CPR resumed

## 2017-05-18 NOTE — Code Documentation (Signed)
Pupils non-reactive

## 2017-05-18 NOTE — Code Documentation (Signed)
CPR paused for pulse check, PEA on the monitor, CPR resume

## 2017-05-18 NOTE — Progress Notes (Signed)
While rounding in ED Ch saw Pt arrive with CPR. Ch checked on the Pt and later informed that Pt died. Cha went to front to make sure family had not arrived. Family arrived about 40 minutes later. Chaplain assisted Dr in informing family. Chap. practiced ministry of presence and grief support.    2017/05/05 1100  Clinical Encounter Type  Visited With Patient;Patient and family together  Visit Type Initial;Death;Trauma  Referral From [place] Needs Prayer;Grief support

## 2017-05-18 NOTE — Code Documentation (Signed)
CPR paused for pulse check, no pulses present, PEA on monitor

## 2017-05-18 NOTE — Code Documentation (Signed)
Pt began to brady down, pulses check performed, no pulses present. Pt in PEA on monitor. Dr. Clearnce Hasten at bedside

## 2017-05-18 NOTE — Code Documentation (Signed)
Faint pulses present

## 2017-05-18 NOTE — ED Provider Notes (Signed)
[facility] Emergency Department Provider Note  ____________________________________________   First MD Initiated Contact with Patient 05/14/17 [phone]     (approximate)  I have reviewed the triage vital signs and the nursing notes.   HISTORY  Chief Complaint Cardiac Arrest   HPI Todd [name] is a 77 y.o. male with a history of chronic kidney disease on peritoneal dialysis was presented to the emergency department today with CPR in progress.  EMS reports they were called out for respiratory distress.  When they arrived, the patient was in severe respiratory distress and was placed on CPAP.  However, while the patient was being transported he became unresponsive and CPR was initiated.  The patient became unresponsive about 3 minutes prior to arrival at the hospital at 837 per the medics.  Patient arrived with CPR in progress being bagged with bag valve mask.  Past Medical History:  Diagnosis Date  . 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 CKD (chronic kidney disease)   . Coronary artery disease   . CVA (cerebrovascular accident) ([place])   . Diabetes mellitus    lantus and novolog;fasting sugars run 70-120  . DVT (deep venous thrombosis) ([place])   . Hyperlipidemia    takes Crestor daily  . Hypertension    takes Amlodipine and Metoprolol  . Joint pain    knees,elbows,back  . PONV (postoperative nausea and vomiting)     Patient Active Problem List   Diagnosis Date Noted  . Dysuria 05/17/2016  . Pyocystis 05/17/2016  . Pressure injury of skin 12/22/2015  . Central line insertion site infection   . Hypoxia   . Respiratory failure with hypoxia ([place])   . Pneumonia 12/11/2015  . ESRD (end stage renal disease) ([place]) 12/11/2015  . CAD (coronary artery disease) 12/11/2015  . Essential hypertension 04/29/2015  . 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 HLD (hyperlipidemia) 04/29/2015  . Thalamic hemorrhage ([place])   . ICH  (intracerebral hemorrhage) ([place]) 02/01/2015  . Abdominal pain 01/25/2015  . Acute hepatitis   . Enteritis   . Ileus ([place])   . Uncontrollable vomiting     Past Surgical History:  Procedure Laterality Date  . BACK SURGERY  09/15/10  . CARDIAC CATHETERIZATION  01/2006   2 stents placed  . CARDIAC CATHETERIZATION Right 12/20/2015   Procedure: Coronary Angiography;  Surgeon: Isaias Cowman, MD;  Location: [place];  Service: Cardiovascular;  Laterality: Right;  . CATARACT EXTRACTION  12/21/10--02/01/11   right and left  . CHOLECYSTECTOMY  20+yrs ago  . COLONOSCOPY    . ELBOW SURGERY  2012   right elbow  . KNEE ARTHROSCOPY  80's   right knee  . TOTAL HIP ARTHROPLASTY    . ULNAR NERVE TRANSPOSITION  03/02/2011   Procedure: ULNAR NERVE DECOMPRESSION/TRANSPOSITION;  Surgeon: Peggyann Shoals, MD;  Location: [place] [facility];  Service: Neurosurgery;  Laterality: Left;  LEFT ulnar nerve decompression    Prior to Admission medications   Medication Sig Start Date End Date Taking? Authorizing Provider  amLODipine (NORVASC) 10 MG tablet Take 10 mg by mouth daily.  04/05/15   [provider]  aspirin EC 81 MG tablet Take 1 tablet (81 mg total) by mouth daily. 02/18/15   Donzetta Starch, NP  calcitRIOL (ROCALTROL) 0.25 MCG capsule Take 0.25 mcg every other day by mouth.    [provider]  calcium acetate (PHOSLO) 667 MG capsule Take 1,334 mg 3 (three) times daily  with meals by mouth.    [provider]  carvedilol (COREG) 6.25 MG tablet Take 6.25 mg 2 (two) times daily with a meal by mouth.     [provider]  cholecalciferol (VITAMIN D) 1000 UNITS tablet Take 2,000 Units daily by mouth.     [provider]  ciprofloxacin (CIPRO) 250 MG tablet Take 1 tablet (250 mg total) by mouth 2 (two) times daily. Patient not taking: Reported on 04/26/2016 04/11/16   Zara Council A, PA-C  clopidogrel (PLAVIX) 75 MG tablet Take 1 tablet (75 mg total) by  mouth daily. 12/24/15   Bettey Costa, MD  furosemide (LASIX) 40 MG tablet Take 80 mg 2 (two) times daily by mouth.  01/04/16   [provider]  glucose blood (ONE TOUCH ULTRA TEST) test strip 1 each by Other route 4 (four) times daily.  09/08/14   [provider]  insulin glargine (LANTUS) 100 UNIT/ML injection Inject 5-8 Units daily as needed into the skin (Uses on sliding scale as needed if fast acting and Humulin 70/30 do not lower sugar enough). 5 units on Sunday, Tuesday, and Thursday. 8 units on Monday, Wednesday, Friday, and Saturday.    [provider]  insulin lispro (HUMALOG) 100 UNIT/ML injection Inject 6 Units 3 (three) times daily with meals into the skin. Takes at lunch if blood sugar is over 150    [provider]  insulin NPH-regular Human (NOVOLIN 70/30) (70-30) 100 UNIT/ML injection 18 units with food after meals 04/04/16   [provider]  ONE TOUCH ULTRA TEST test strip 1 each by Other route 4 (four) times daily.  03/16/15   [provider]  rosuvastatin (CRESTOR) 20 MG tablet Take 20 mg by mouth at bedtime.  02/23/15   [provider]    Allergies Centrum; Valsartan; and Vitamin d analogs  Family History  Problem Relation Age of Onset  . Diabetes Mother   . Heart disease Father   . Lung cancer Father   . Anesthesia problems Neg Hx   . Hypotension Neg Hx   . Malignant hyperthermia Neg Hx   . Pseudochol deficiency Neg Hx   . Kidney disease Neg Hx   . Kidney cancer Neg Hx   . Prostate cancer Neg Hx     Social History Social History   Tobacco Use  . Smoking status: Never Smoker  . Smokeless tobacco: Never Used  Substance Use Topics  . Alcohol use: No  . Drug use: No    Review of Systems  Level 5 caveat secondary to patient being unresponsive   ____________________________________________   PHYSICAL EXAM:  VITAL SIGNS: ED Triage Vitals  Enc Vitals Group     BP 05-17-2017 0855 (!) 67/10      Pulse Rate 05-17-2017 0906 63     Resp --      Temp --      Temp src --      SpO2 05/17/17 0856 (!) 60 %     Weight May 17, 2017 0900 177 lb 0.5 oz (80.3 kg)     Height --      Head Circumference --      Peak Flow --      Pain Score --      Pain Loc --      Pain Edu? --      Excl. in [place]? --     Constitutional: GCS of 3 Eyes: Conjunctivae are normal.  Pupils initially 4 mm bilaterally and unresponsive  to light. Head: Atraumatic. Nose: No congestion/rhinnorhea. Mouth/Throat: Mucous membranes are moist.  Neck: No stridor.   Cardiovascular: CPR in progress.  No central pulses at the groin or the neck with initial pulse check. Respiratory: Respirations reported with bag valve mask. Gastrointestinal: Soft. No distention.  Also with peritoneal dialysis catheter to the left lower quadrant without any surrounding erythema, induration or pus. Musculoskeletal: Mild bilateral lower extreme edema. Neurologic: GCS of 3 Skin:  Skin is warm, dry and intact. No rash noted.   ____________________________________________   LABS (all labs ordered are listed, but only abnormal results are displayed)  Labs Reviewed  GLUCOSE, CAPILLARY - Abnormal; Notable for the following components:      Result Value   Glucose-Capillary 322 (*)    All other components within normal limits   ____________________________________________  EKG  ED ECG REPORT I, Doran Stabler, the attending physician, personally viewed and interpreted this ECG.   Date: 05-07-17  EKG Time: 0901  Rate: 50  Rhythm: junctional rythm  Axis: normal  Intervals:none  ST&T Change: EKG machine reads depression in V1 through V3 but I do not see significant depression in these leads.  There is a wide complex junctional rhythm.  ____________________________________________  RADIOLOGY   ____________________________________________   PROCEDURES  Procedure(s) performed:    Procedure Name: Intubation Date/Time: 05/07/2017  11:13 AM Performed by: Orbie Pyo, MD Pre-anesthesia Checklist: Patient identified, Patient being monitored, Emergency Drugs available, Timeout performed and Suction available Oxygen Delivery Method: Non-rebreather mask Preoxygenation: Pre-oxygenation with 100% oxygen Induction Type: Rapid sequence Ventilation: Mask ventilation without difficulty Laryngoscope Size: Glidescope Tube size: 8.0 mm Number of attempts: 1 Placement Confirmation: ETT inserted through vocal cords under direct vision,  CO2 detector and Breath sounds checked- equal and bilateral Tube secured with: ETT holder    .Critical Care Performed by: Orbie Pyo, MD Authorized by: Orbie Pyo, MD   Critical care provider statement:    Critical care time (minutes):  35   Critical care was necessary to treat or prevent imminent or life-threatening deterioration of the following conditions:  Cardiac failure and respiratory failure   Critical care was time spent personally by me on the following activities:  Pulse oximetry, examination of patient and ordering and performing treatments and interventions    Critical Care performed: Yes, see critical care note(s)  ____________________________________________   INITIAL IMPRESSION / ASSESSMENT AND PLAN / ED COURSE  Pertinent labs & imaging results that were available during my care of the patient were reviewed by me and considered in my medical decision making (see chart for details).  Differential includes, but is not limited to, viral syndrome, bronchitis including COPD exacerbation, pneumonia, reactive airway disease including asthma, CHF including exacerbation with or without pulmonary/interstitial edema, pneumothorax, ACS, thoracic trauma, and pulmonary embolism. As part of my medical decision making, I reviewed the following data within the electronic MEDICAL RECORD NUMBER Notes from prior ED  visits  ----------------------------------------- 11:15 AM on 07-May-2017 -----------------------------------------  Multiple rounds of CPR performed.  Patient given 4 mg epinephrine total.  Also with 2 A of bicarb as well as 1 amp calcium chloride.  We were able to obtain a pulse briefly.  Atropine was given.  However, pulse was not able to be maintained and the patient expired.  I discussed the procedures performed as well as the course of the patient's stay in the emergency department with the wife.  She has been at the patient's bedside.  Possible considerations for the patient's  death could have been flash pulmonary edema, MI, pulmonary embolus, electrolyte abnormality such as hyperkalemia.    ____________________________________________   FINAL CLINICAL IMPRESSION(S) / ED DIAGNOSES  Cardiac arrest  NEW MEDICATIONS STARTED DURING THIS VISIT:  New Prescriptions   No medications on file     Note:  This document was prepared using Dragon voice recognition software and may include unintentional dictation errors.     Orbie Pyo, MD May 22, 2017 [phone]

## 2017-05-18 NOTE — ED Triage Notes (Signed)
Pt to ED via [facility] from home for respiratory distress. Pt was placed on BiPap and went into PEA. Pt to ED CPR in progress, no airway established, pt currently being bagged.

## 2017-05-18 NOTE — Code Documentation (Signed)
1 amp bicarb given

## 2017-05-18 NOTE — Code Documentation (Addendum)
Pulses are present at this time

## 2017-05-18 NOTE — Code Documentation (Addendum)
CPR paused for pulse check, no pulses present, CPR restarted

## 2017-05-18 NOTE — Code Documentation (Signed)
1 amp calcium given

## 2017-05-18 NOTE — Code Documentation (Signed)
Pulses present at this time.  

## 2017-05-18 NOTE — Code Documentation (Addendum)
CPR paused for pulse check, faint pulses present. Dr Clearnce Hasten at bedside doing cardiac ultrasound, mild cardiac activity present

## 2017-05-18 NOTE — Code Documentation (Signed)
Patient time of death occurred at [phone].

## 2017-05-18 NOTE — Code Documentation (Signed)
Airway established

## 2017-05-18 NOTE — Code Documentation (Signed)
Pulses lost. 

## 2017-05-18 DEATH — deceased

## 2018-02-28 IMAGING — CT CT RENAL STONE PROTOCOL
2 of 4 series · 16 of 46 positions shown, 18 images · non-contrast
Comparison: Ultrasound 04/20/2016, CT 01/24/2015

CLINICAL DATA: Right-sided flank pain

EXAM:
CT ABDOMEN AND PELVIS WITHOUT CONTRAST
TECHNIQUE: Multidetector CT imaging of the abdomen and pelvis was performed
following the standard protocol without IV contrast.

[Series 2: stone full standard · axial · 0.82mm/px · z∈[-509,-69]mm · 13 of 98 slices shown, 15 images]
[im 5/98  soft-tissue]
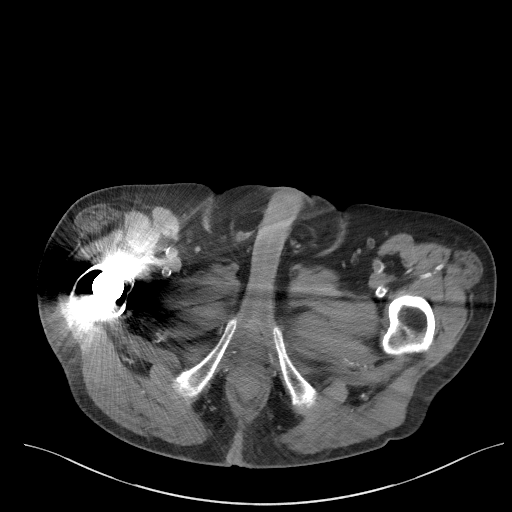
[im 5/98  bone]
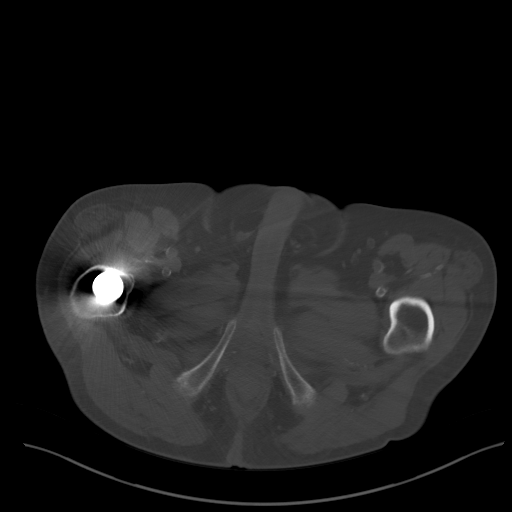
[im 13/98  soft-tissue]
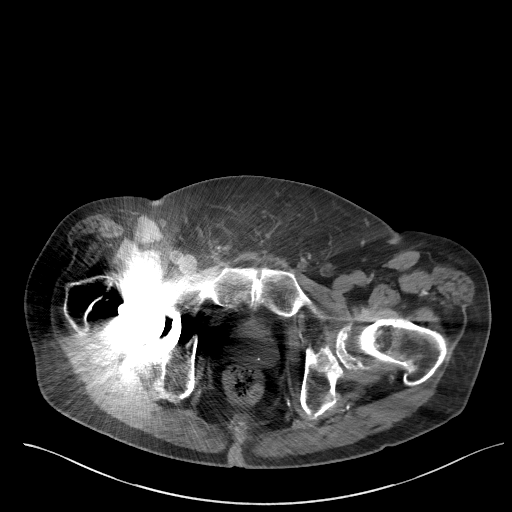
[im 21/98  soft-tissue]
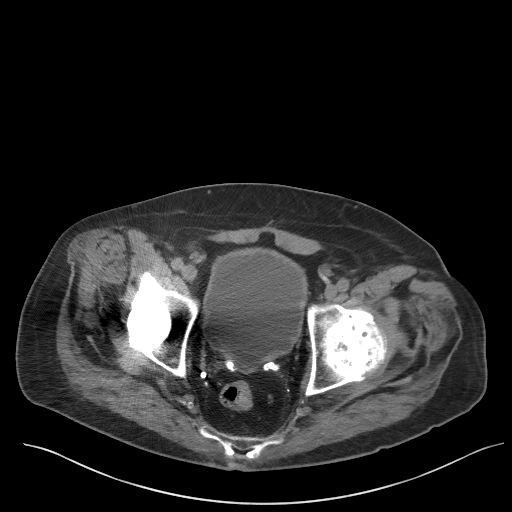
[im 29/98  soft-tissue]
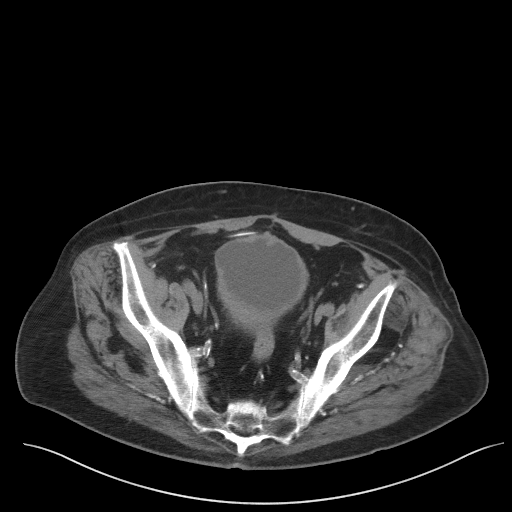
[im 33/98  soft-tissue]
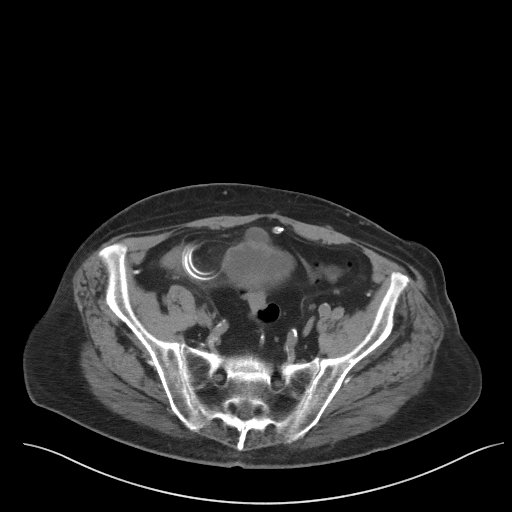
[im 41/98  soft-tissue]
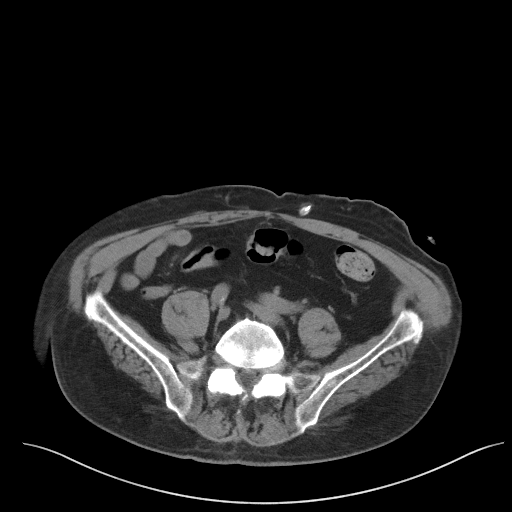
[im 49/98  soft-tissue]
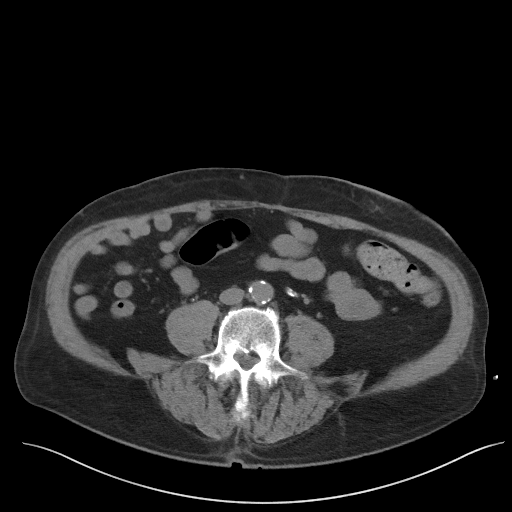
[im 57/98  soft-tissue]
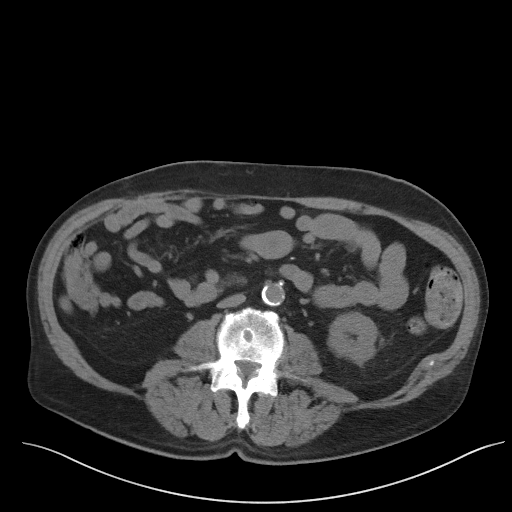
[im 65/98  soft-tissue]
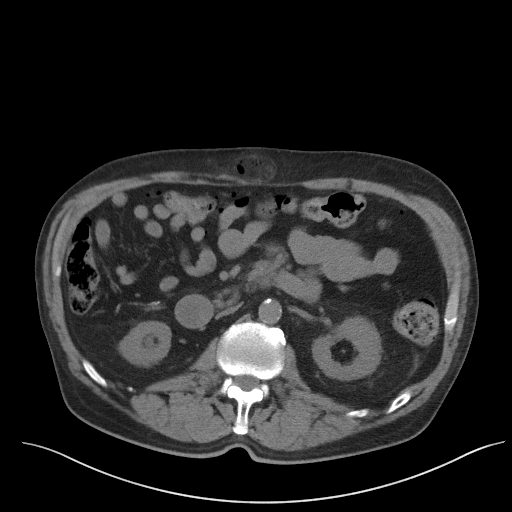
[im 65/98  bone]
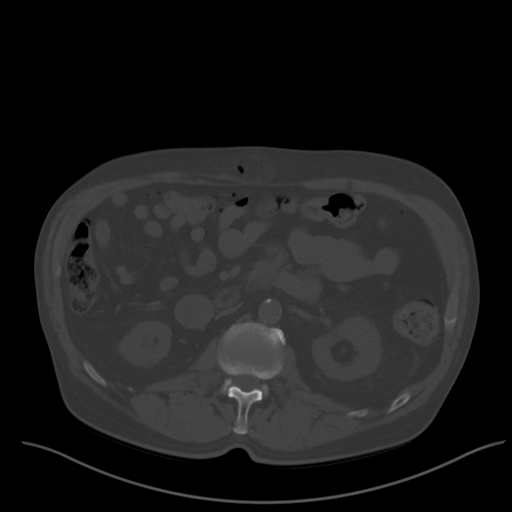
[im 69/98  soft-tissue]
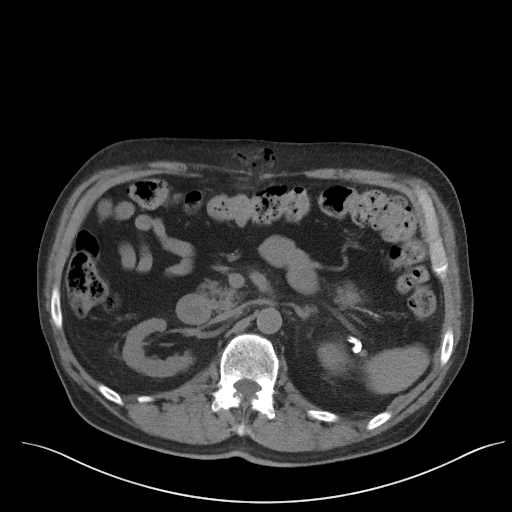
[im 77/98  soft-tissue]
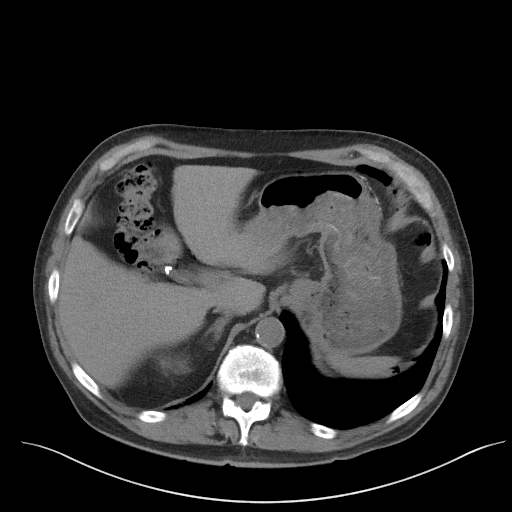
[im 85/98  soft-tissue]
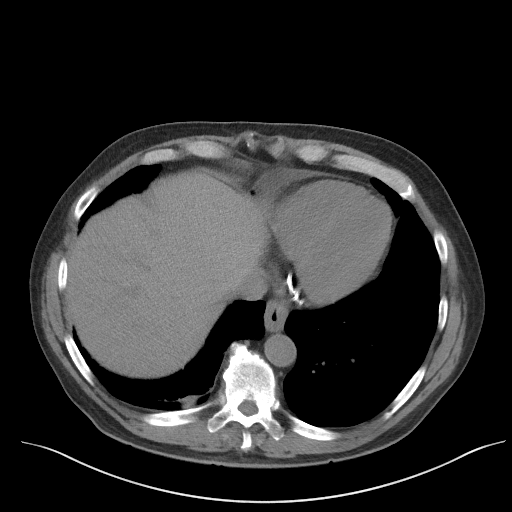
[im 93/98  soft-tissue]
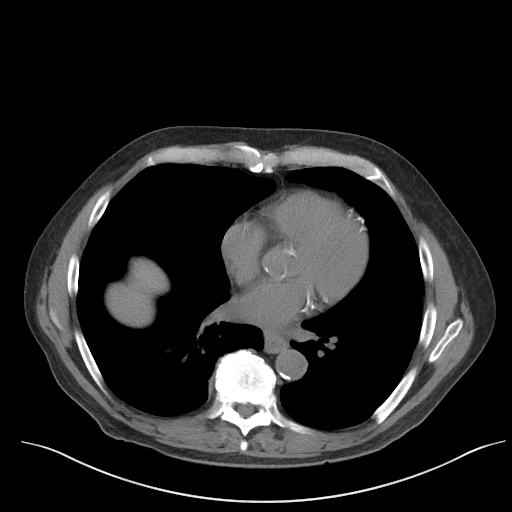

[Series 5: coronal · coronal · 0.82mm/px · 3 of 141 slices shown]
[im 47/141  soft-tissue]
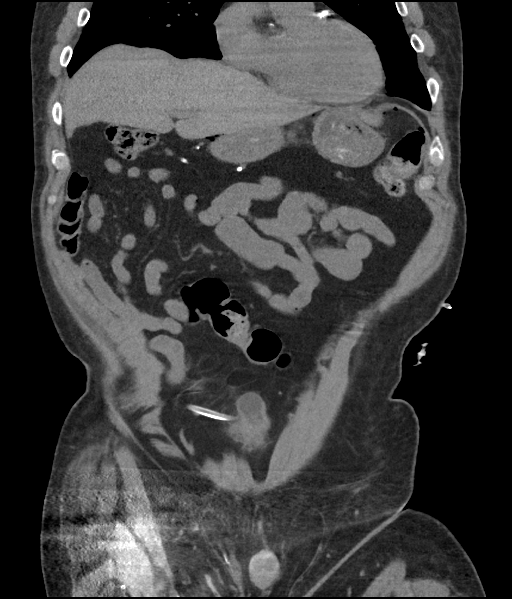
[im 63/141  soft-tissue]
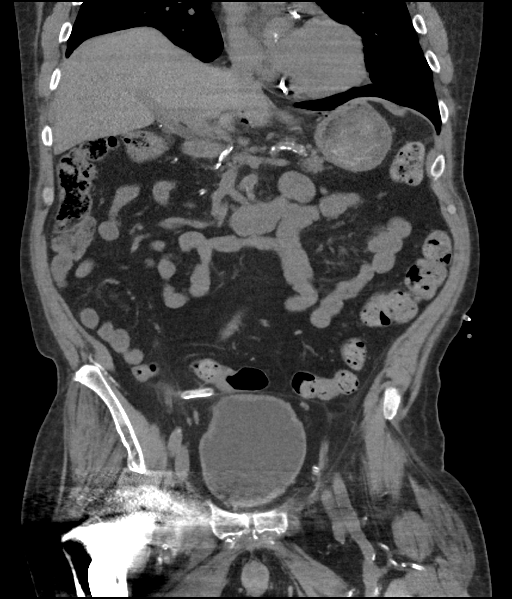
[im 78/141  soft-tissue]
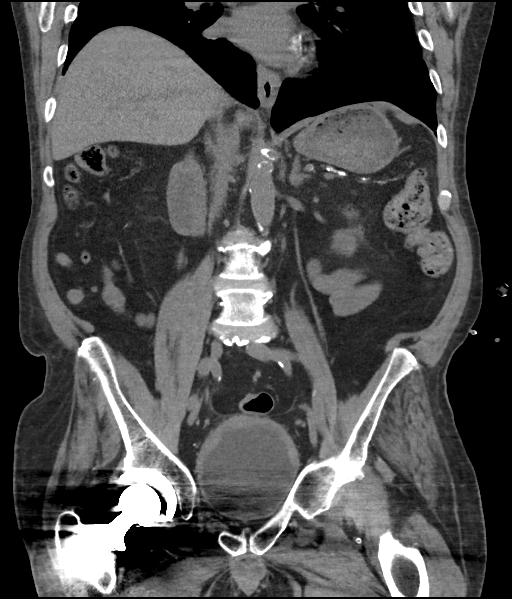

[16 of 46 positions shown; findings below may reference images not displayed]

FINDINGS: Lower chest: Lung bases demonstrate no acute consolidation or
pleural effusion. Linear scarring or atelectasis at both lung bases.
Small pericardial effusion. Normal heart size. Mild distal
esophageal thickening.

Hepatobiliary: Surgical clips at the gallbladder fossa. No focal
hepatic abnormality. No significant biliary dilatation

Pancreas: Unremarkable. No pancreatic ductal dilatation or
surrounding inflammatory changes.

Spleen: Normal in size without focal abnormality.

Adrenals/Urinary Tract: Adrenal glands are within normal limits. No
hydronephrosis. Negative for stones. 13 mm probable cyst mid to
lower pole left kidney. Irregular wall thickening of the bladder.
Small diverticulum anteriorly.

Stomach/Bowel: The stomach is nonenlarged. No dilated small bowel.
No colon wall thickening. Normal appendix.

Vascular/Lymphatic: Extensive vascular calcification. No aneurysmal
dilatation. No significantly enlarged lymph nodes

Reproductive: Prostate calcification.

Other: Peritoneal dialysis catheter coiled in the right lower
quadrant. Small foci of free air adjacent to the spleen and liver
and within the left upper quadrant. Containing ventral hernia, with
small foci of gas in the hernia sac.

Musculoskeletal: Artifact from right hip replacement. Advanced
arthritis of the left hip. Degenerative changes of the spine
IMPRESSION: 1. Negative for hydronephrosis or ureteral stone. There is irregular
contour of the bladder with wall thickening, possibly due to chronic
inflammation.
2. Small foci of free air adjacent to the liver and spleen,
presumably due to the patient's peritoneal dialysis catheter.
Negative appendix and no bowel wall thickening is seen.
3. Small fat containing ventral hernia with hernia sac now
containing small foci of gas, probably also related to the
peritoneal dialysis catheter
4. Small pericardial effusion
5. Mild distal esophageal thickening, query mild esophagitis or
reflux.
